# Patient Record
Sex: Female | Born: 1945 | Race: White | Hispanic: No | Smoking: Never smoker
Health system: Southern US, Community
[De-identification: ages and names within clinical notes are randomized; demographics above are authoritative.]

## PROBLEM LIST (undated history)

## (undated) DIAGNOSIS — I2699 Other pulmonary embolism without acute cor pulmonale: Secondary | ICD-10-CM

## (undated) DIAGNOSIS — K219 Gastro-esophageal reflux disease without esophagitis: Secondary | ICD-10-CM

## (undated) DIAGNOSIS — Z8669 Personal history of other diseases of the nervous system and sense organs: Secondary | ICD-10-CM

## (undated) DIAGNOSIS — E785 Hyperlipidemia, unspecified: Secondary | ICD-10-CM

## (undated) DIAGNOSIS — G4730 Sleep apnea, unspecified: Secondary | ICD-10-CM

## (undated) DIAGNOSIS — Z8673 Personal history of transient ischemic attack (TIA), and cerebral infarction without residual deficits: Secondary | ICD-10-CM

## (undated) DIAGNOSIS — I10 Essential (primary) hypertension: Secondary | ICD-10-CM

## (undated) DIAGNOSIS — T7840XA Allergy, unspecified, initial encounter: Secondary | ICD-10-CM

## (undated) DIAGNOSIS — I639 Cerebral infarction, unspecified: Secondary | ICD-10-CM

## (undated) DIAGNOSIS — M1990 Unspecified osteoarthritis, unspecified site: Secondary | ICD-10-CM

## (undated) HISTORY — PX: OTHER SURGICAL HISTORY: SHX169

## (undated) HISTORY — PX: CATARACT EXTRACTION, BILATERAL: SHX1313

## (undated) HISTORY — DX: Personal history of other diseases of the nervous system and sense organs: Z86.69

## (undated) HISTORY — DX: Gastro-esophageal reflux disease without esophagitis: K21.9

## (undated) HISTORY — DX: Cerebral infarction, unspecified: I63.9

## (undated) HISTORY — DX: Allergy, unspecified, initial encounter: T78.40XA

## (undated) HISTORY — DX: Sleep apnea, unspecified: G47.30

## (undated) HISTORY — DX: Personal history of transient ischemic attack (TIA), and cerebral infarction without residual deficits: Z86.73

## (undated) HISTORY — DX: Unspecified osteoarthritis, unspecified site: M19.90

## (undated) HISTORY — DX: Hyperlipidemia, unspecified: E78.5

---

## 1998-07-24 ENCOUNTER — Other Ambulatory Visit: Admission: RE | Admit: 1998-07-24 | Discharge: 1998-07-24 | Payer: Self-pay | Admitting: Gynecology

## 1998-12-06 DIAGNOSIS — Z8673 Personal history of transient ischemic attack (TIA), and cerebral infarction without residual deficits: Secondary | ICD-10-CM

## 1998-12-06 HISTORY — DX: Personal history of transient ischemic attack (TIA), and cerebral infarction without residual deficits: Z86.73

## 1999-11-04 ENCOUNTER — Other Ambulatory Visit: Admission: RE | Admit: 1999-11-04 | Discharge: 1999-11-04 | Payer: Self-pay | Admitting: Gynecology

## 2000-11-04 ENCOUNTER — Other Ambulatory Visit: Admission: RE | Admit: 2000-11-04 | Discharge: 2000-11-04 | Payer: Self-pay | Admitting: Gynecology

## 2001-11-23 ENCOUNTER — Other Ambulatory Visit: Admission: RE | Admit: 2001-11-23 | Discharge: 2001-11-23 | Payer: Self-pay | Admitting: Gynecology

## 2003-12-25 ENCOUNTER — Other Ambulatory Visit: Admission: RE | Admit: 2003-12-25 | Discharge: 2003-12-25 | Payer: Self-pay | Admitting: Gynecology

## 2005-10-18 ENCOUNTER — Other Ambulatory Visit: Admission: RE | Admit: 2005-10-18 | Discharge: 2005-10-18 | Payer: Self-pay | Admitting: Gynecology

## 2008-10-16 ENCOUNTER — Encounter: Admission: RE | Admit: 2008-10-16 | Discharge: 2008-10-16 | Payer: Self-pay | Admitting: Gynecology

## 2010-10-20 ENCOUNTER — Encounter: Admission: RE | Admit: 2010-10-20 | Discharge: 2010-10-20 | Payer: Self-pay | Admitting: Gynecology

## 2012-03-29 ENCOUNTER — Encounter: Payer: Self-pay | Admitting: *Deleted

## 2012-03-29 DIAGNOSIS — Z8669 Personal history of other diseases of the nervous system and sense organs: Secondary | ICD-10-CM | POA: Insufficient documentation

## 2012-03-29 DIAGNOSIS — Z8673 Personal history of transient ischemic attack (TIA), and cerebral infarction without residual deficits: Secondary | ICD-10-CM | POA: Insufficient documentation

## 2012-08-08 ENCOUNTER — Other Ambulatory Visit: Payer: Self-pay | Admitting: Gynecology

## 2012-08-08 DIAGNOSIS — Z1231 Encounter for screening mammogram for malignant neoplasm of breast: Secondary | ICD-10-CM

## 2012-08-09 ENCOUNTER — Ambulatory Visit
Admission: RE | Admit: 2012-08-09 | Discharge: 2012-08-09 | Disposition: A | Discharge status: Home / Self Care | Payer: BC Managed Care – PPO | Source: Ambulatory Visit | Attending: Gynecology | Admitting: Gynecology

## 2012-08-09 DIAGNOSIS — Z1231 Encounter for screening mammogram for malignant neoplasm of breast: Secondary | ICD-10-CM

## 2013-07-17 ENCOUNTER — Other Ambulatory Visit: Payer: Self-pay | Admitting: Gynecology

## 2013-07-17 DIAGNOSIS — E2839 Other primary ovarian failure: Secondary | ICD-10-CM

## 2013-07-17 DIAGNOSIS — Z780 Asymptomatic menopausal state: Secondary | ICD-10-CM

## 2013-08-14 ENCOUNTER — Ambulatory Visit
Admission: RE | Admit: 2013-08-14 | Discharge: 2013-08-14 | Disposition: A | Discharge status: Home / Self Care | Payer: BC Managed Care – PPO | Source: Ambulatory Visit

## 2013-08-14 ENCOUNTER — Other Ambulatory Visit: Payer: Self-pay

## 2013-08-14 ENCOUNTER — Ambulatory Visit
Admission: RE | Admit: 2013-08-14 | Discharge: 2013-08-14 | Disposition: A | Discharge status: Home / Self Care | Payer: BC Managed Care – PPO | Source: Ambulatory Visit | Attending: Gynecology | Admitting: Gynecology

## 2013-08-14 DIAGNOSIS — Z1231 Encounter for screening mammogram for malignant neoplasm of breast: Secondary | ICD-10-CM

## 2013-08-14 DIAGNOSIS — E2839 Other primary ovarian failure: Secondary | ICD-10-CM

## 2013-08-14 DIAGNOSIS — Z780 Asymptomatic menopausal state: Secondary | ICD-10-CM

## 2014-07-19 IMAGING — MG MM SCREEN MAMMOGRAM BILATERAL
4 series · 4 of 4 positions shown · non-contrast
Comparison: None.

CLINICAL DATA: Screening.

DIGITAL SCREENING BILATERAL MAMMOGRAM WITH CAD

[R CC]
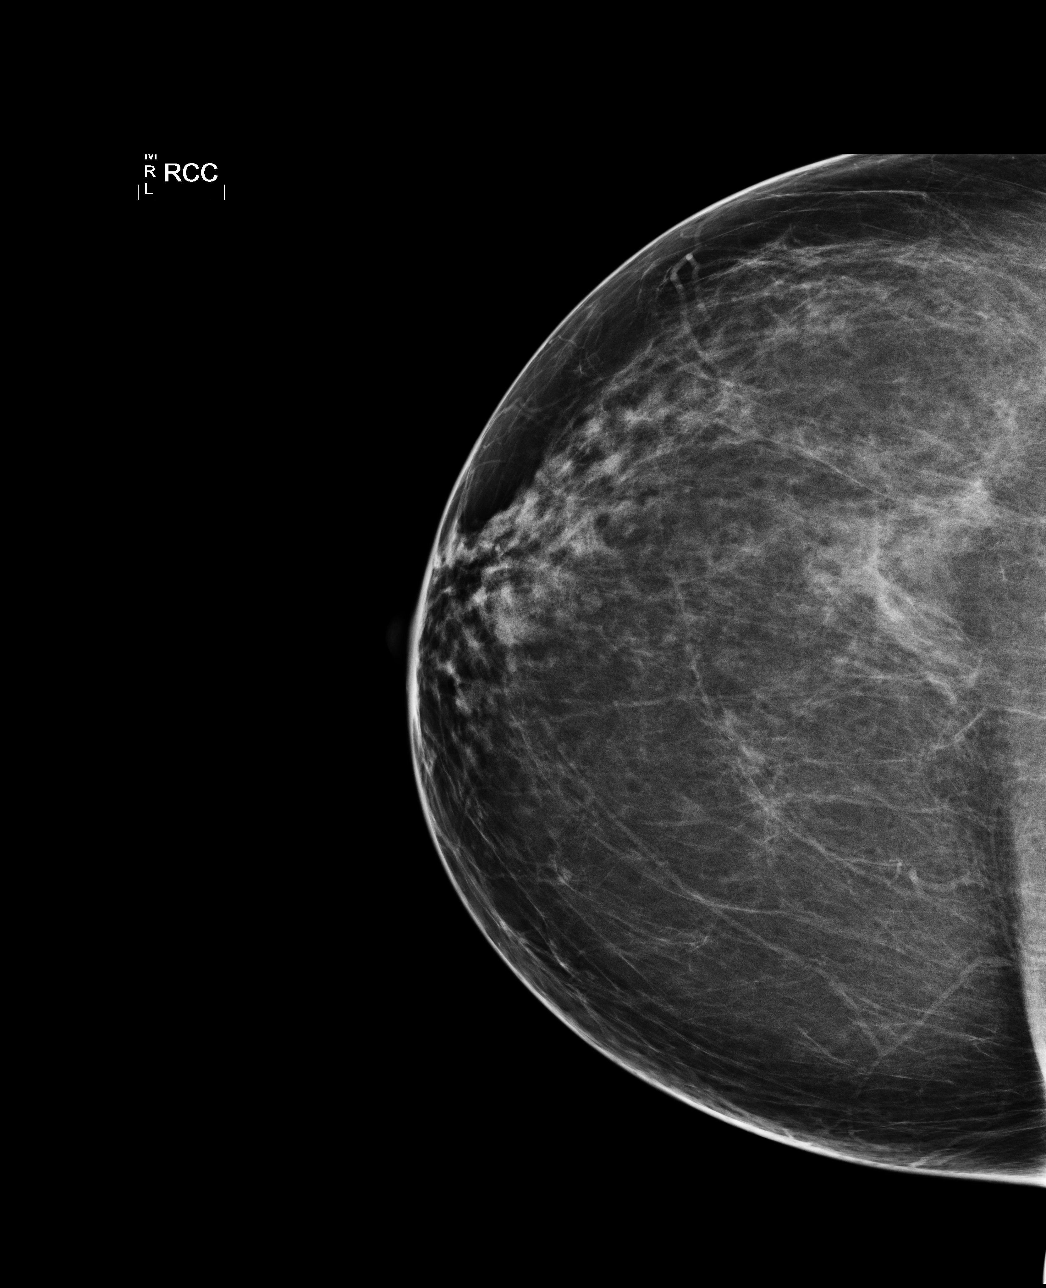

[L CC]
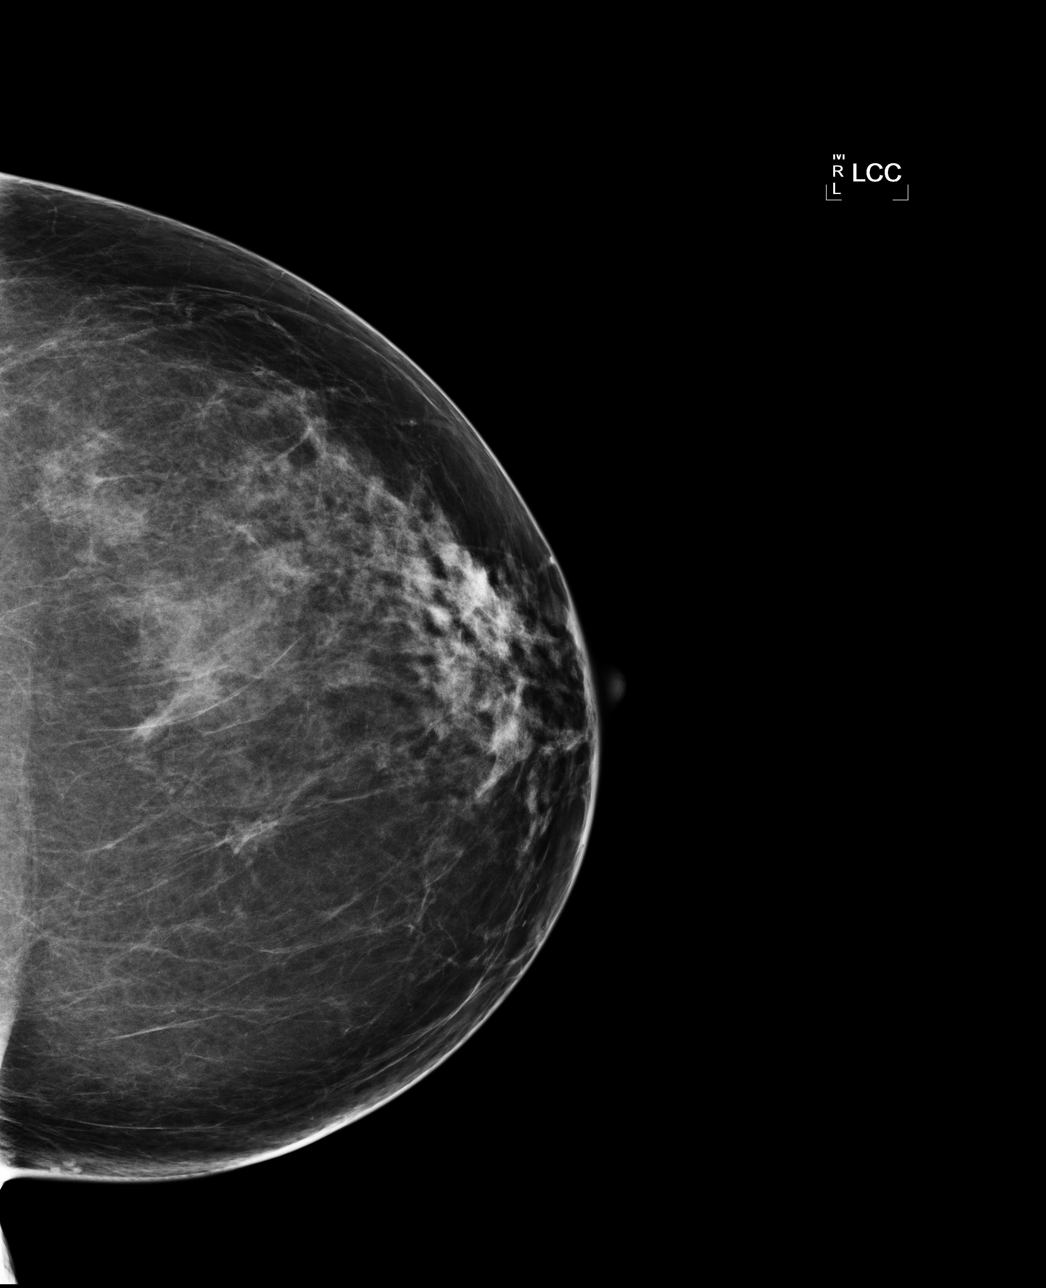

[L MLO]
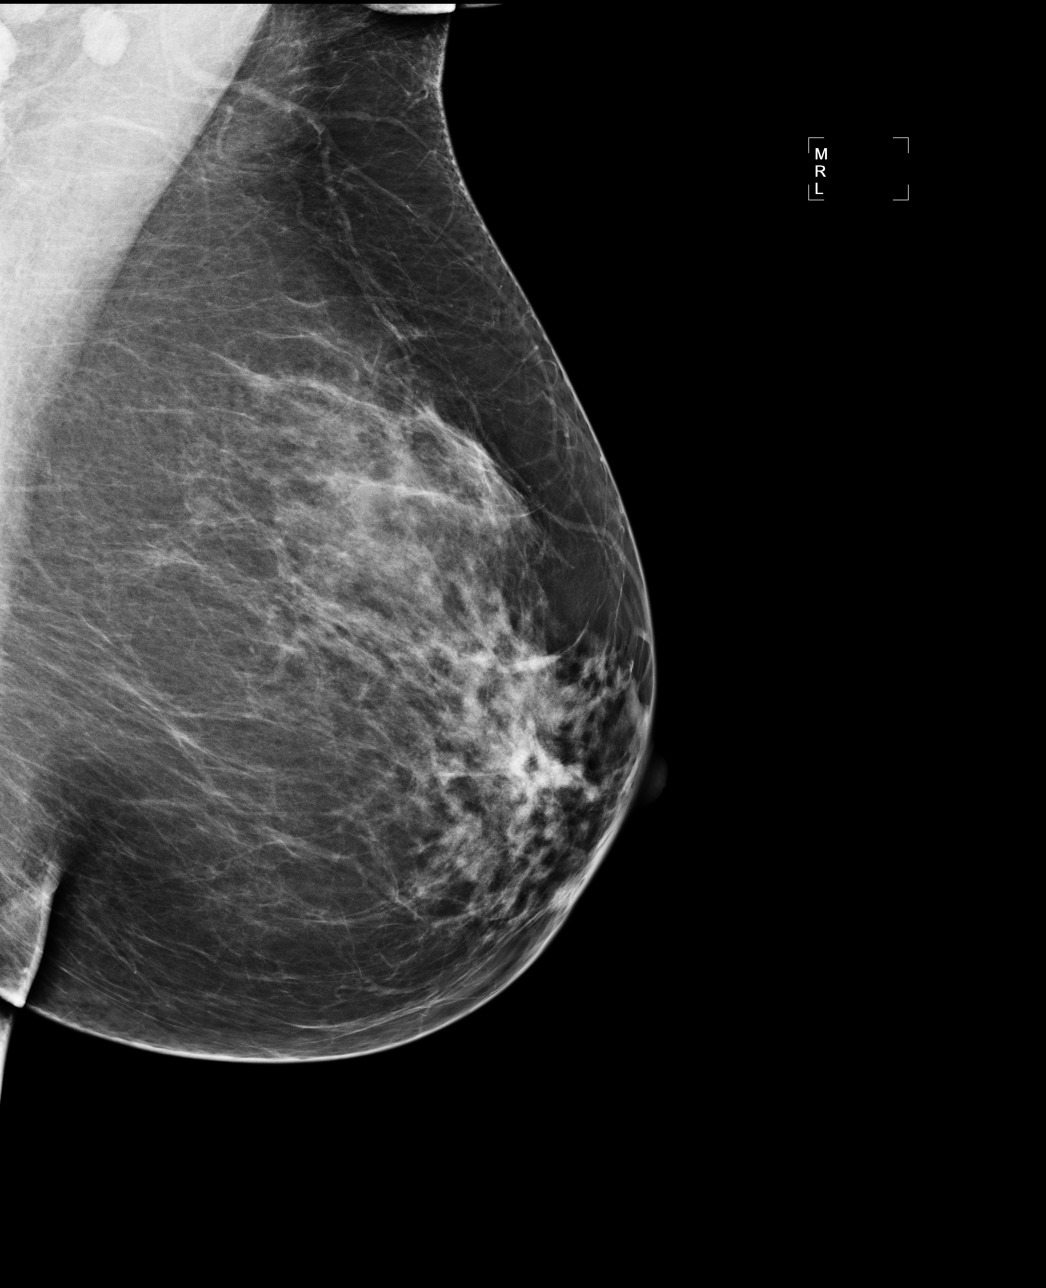

[R MLO]
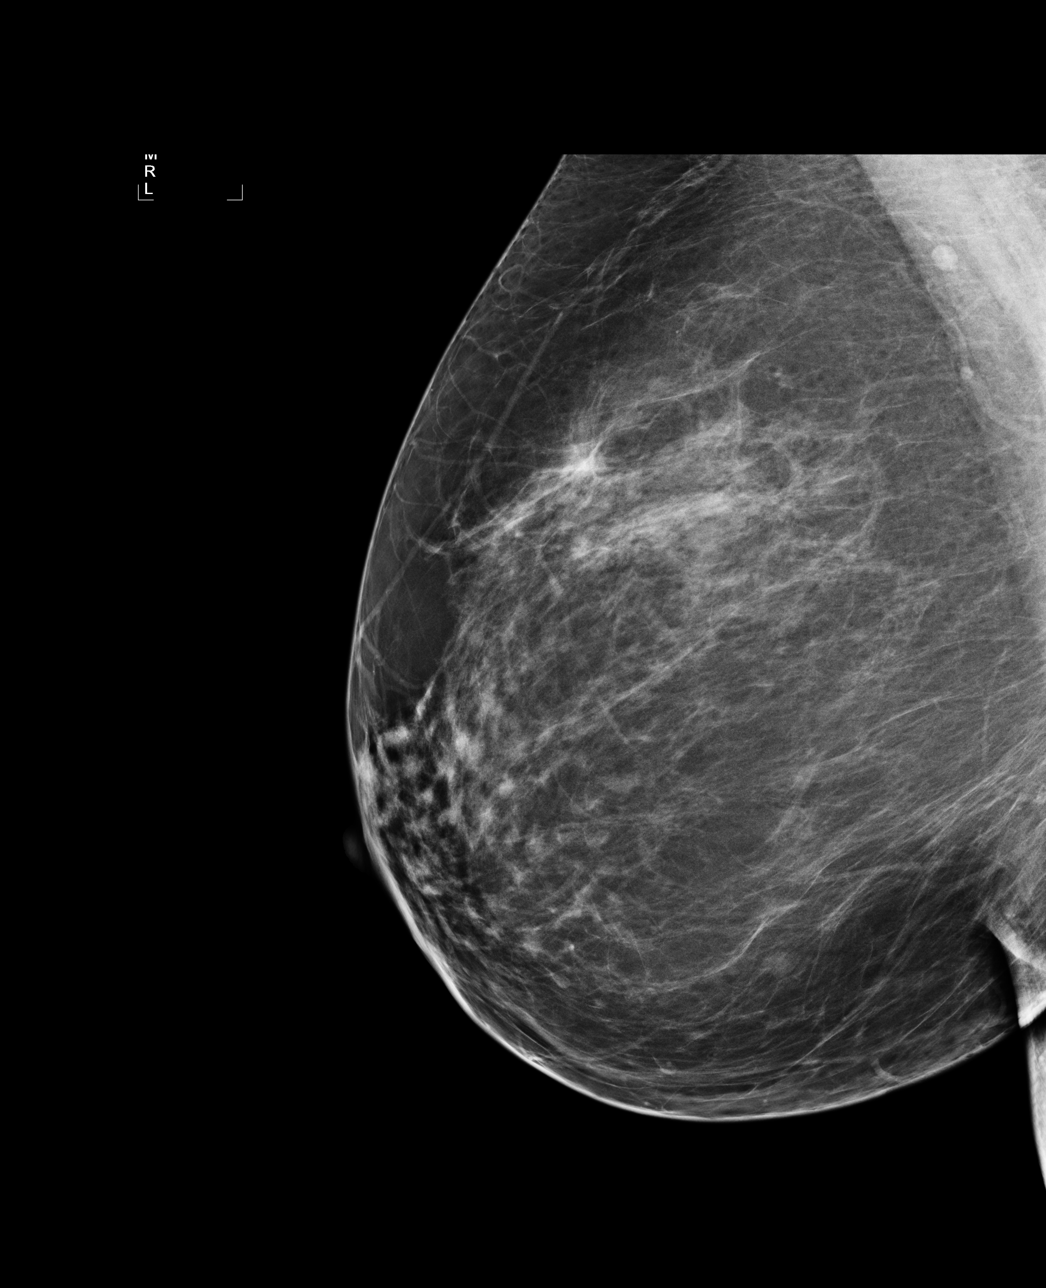

[4 of 4 positions shown; findings below may reference images not displayed]

FINDINGS: ACR Breast Density Category b:  There are scattered areas of
fibroglandular density.

There are no findings suspicious for malignancy.

Images were processed with CAD.
IMPRESSION: No mammographic evidence of malignancy.

A result letter of this screening mammogram will be mailed directly
to the patient.

RECOMMENDATION:
Screening mammogram in one year. (Code:WE-K-JAQ)

BI-RADS CATEGORY 1:  Negative.

## 2014-11-22 ENCOUNTER — Ambulatory Visit (INDEPENDENT_AMBULATORY_CARE_PROVIDER_SITE_OTHER): Payer: Medicare Other

## 2014-11-22 ENCOUNTER — Ambulatory Visit (INDEPENDENT_AMBULATORY_CARE_PROVIDER_SITE_OTHER): Payer: Medicare Other | Admitting: Podiatrist

## 2014-11-22 ENCOUNTER — Encounter: Payer: Self-pay | Admitting: Podiatrist

## 2014-11-22 VITALS — BP 148/92 | HR 79 | Resp 16

## 2014-11-22 DIAGNOSIS — M2012 Hallux valgus (acquired), left foot: Secondary | ICD-10-CM

## 2014-11-22 NOTE — Progress Notes (Signed)
   Subjective:    Patient ID: Leah Knight, female    DOB: [birth year], 68 y.o.   MRN: [id number]  HPI Comments: "I have a bunion"  Patient c/o aching 1st MPJ left for several months. Swelling by end of day. Shoes are getting uncomfortable. No home treatment.     Review of Systems  All other systems reviewed and are negative.      Objective:   Physical Exam Patient is awake, alert, and oriented x 3.  In no acute distress.  Vascular status is intact with palpable pedal pulses at 2/4 DP and PT bilateral and capillary refill time within normal limits. Neurological sensation is also intact bilaterally via Semmes Weinstein monofilament at 5/5 sites. Light touch, vibratory sensation, Achilles tendon reflex is intact. Dermatological exam reveals skin color, turger and texture as normal. No open lesions present.  Musculature intact with dorsiflexion, plantarflexion, inversion, eversion.    Left foot has a moderate bunion deformity present with increased IM angle and promentent metatarsal head medially which is painful in shoes and when walking    Assessment & Plan:  Hallux abducto valgus left foot.  Discussed conservative versus surgical options. Recommended a bunion correction with pin or screw fixation to the left foot. The consent form was discussed and all three pages were signed and the patient's questions were encouraged and answered to the best of my ability. Risks of the surgery were discussed including but not limited to continued pain, infection, swelling, elevated toe, decreased range of motion,  suture or implant reaction, bleeding, decreased function, etc. Preoperative instructions were also dispensed to the patient as well as a preoperative surgical pamphlet to go along with the instructions. Surgery will be scheduled at the patients convenience and patient will be seen at [facility] on outpatient basis.The patient is instructed to call if any questions or  concerns arise.

## 2014-11-22 NOTE — Patient Instructions (Signed)
Bunionectomy A bunionectomy is surgery to remove a bunion. A bunion is an enlargement of the joint at the base of the big toe. It is made up of bone and soft tissue on the inside part of the joint. Over time, a painful lump appears on the inside of the joint. The big toe begins to point inward toward the second toe. New bone growth can occur and a bone spur may form. The pain eventually causes difficulty walking. A bunion usually results from inflammation caused by the irritation of poorly fitting shoes. It often begins later in life. A bunionectomy is performed when nonsurgical treatment no longer works. When surgery is needed, the extent of the procedure will depend on the degree of deformity of the foot. Your surgeon will discuss with you the different procedures and what will work best for you depending on your age and health. LET YOUR CAREGIVER KNOW ABOUT:   Previous problems with anesthetics or medicines used to numb the skin.  Allergies to dyes, iodine, foods, and/or latex.  Medicines taken including herbs, eye drops, prescription medicines (especially medicines used to "thin the blood"), aspirin and other over-the-counter medicines, and steroids (by mouth or as a cream).  History of bleeding or blood problems.  Possibility of pregnancy, if this applies.  History of blood clots in your legs and/or lungs .  Previous surgery.  Other important health problems. RISKS AND COMPLICATIONS   Infection.  Pain.  Nerve damage.  Possibility that the bunion will recur. BEFORE THE PROCEDURE  You should be present 60 minutes prior to your procedure or as directed.  PROCEDURE  Surgery is often done so that you can go home the same day (outpatient). It may be done in a hospital or in an outpatient surgical center. An anesthetic will be used to help you sleep during the procedure. Sometimes, a spinal anesthetic is used to make you numb below the waist. A cut (incision) is made over the swollen  area at the first joint of the big toe. The enlarged lump will be removed. If there is a need to reposition the bones of the big toe, this may require more than 1 incision. The bone itself may need to be cut. Screws and wires may be used in the repair. These can be removed at a later date. In severe cases, the entire joint may need to be removed and a joint replacement inserted. When done, the incision is closed with stitches (sutures). Skin adhesive strips may be added for reinforcement. They help hold the incision closed.  AFTER THE PROCEDURE  Compression bandages (dressings) are then wrapped around the wound. This helps to keep the foot in alignment and reduce swelling. Your foot will be monitored for bleeding and swelling. You will need to stay for a few hours in the recovery area before being discharged. This allows time for the anesthesia to wear off. You will be discharged home when you are awake, stable, and doing well. HOME CARE INSTRUCTIONS   You can expect to return to normal activities within 6 to 8 weeks after surgery. The foot is at increased risk for swelling for several months. When you can expect to bear weight on the operated foot will depend on the extent of your surgery. The milder the deformity, the less tissue is removed and the sooner the return to normal activity level. During the recovery period, a special shoe, boot, or cast may be worn to accommodate the surgical bandage and to help provide stability   to the foot.  Once you are home, an ice pack applied to the operative site may help with discomfort and keep swelling down. Stop using the ice if it causes discomfort.  Keep your feet raised (elevated) when possible to lessen swelling.  If you have an elastic bandage on your foot and you have numbness, tingling, or your foot becomes cold and blue, adjust the bandage to make it comfortable.  Change dressings as directed.  Keep the wound dry and clean. The wound may be washed  gently with soap and water. Gently blot dry without rubbing. Do not take baths or use swimming pools or hot tubs for 10 days, or as instructed by your caregiver.  Only take over-the-counter or prescription medicines for pain, discomfort, or fever as directed by your caregiver.  You may continue a normal diet as directed.  For activity, use crutches with no weight bearing or your orthopedic shoe as directed. Continue to use crutches or a cane as directed until you can stand without causing pain. SEEK MEDICAL CARE IF:   You have redness, swelling, bruising, or increasing pain in the wound.  There is pus coming from the wound.  You have drainage from a wound lasting longer than 1 day.  You have an oral temperature above 102 F (38.9 C).  You notice a bad smell coming from the wound or dressing.  The wound breaks open after sutures have been removed.  You develop dizzy episodes or fainting while standing.  You have persistent nausea or vomiting.  Your toes become cold.  Pain is not relieved with medicines. SEEK IMMEDIATE MEDICAL CARE IF:   You develop a rash.  You have difficulty breathing.  You develop any reaction or side effects to medicines given.  Your toes are numb or blue, or you have severe pain. MAKE SURE YOU:   Understand these instructions.  Will watch your condition.  Will get help right away if you are not doing well or get worse. Document Released: 11/05/2005 Document Revised: 02/14/2012 Document Reviewed: 12/11/2007 ExitCare Patient Information 2015 [employer]. This information is not intended to replace advice given to you by your health care provider. Make sure you discuss any questions you have with your health care provider.  

## 2014-12-13 ENCOUNTER — Ambulatory Visit: Payer: Self-pay | Admitting: Podiatrist

## 2014-12-15 ENCOUNTER — Ambulatory Visit (INDEPENDENT_AMBULATORY_CARE_PROVIDER_SITE_OTHER): Payer: Medicare Other | Admitting: Emergency Medicine

## 2014-12-15 VITALS — BP 139/91 | HR 86 | Temp 97.5°F | Resp 16 | Ht 62.5 in | Wt 157.0 lb

## 2014-12-15 DIAGNOSIS — R42 Dizziness and giddiness: Secondary | ICD-10-CM

## 2014-12-15 LAB — POCT CBC
GRANULOCYTE PERCENT: 68.2 % (ref 37–80)
HEMATOCRIT: 42.8 % (ref 37.7–47.9)
Hemoglobin: 14.1 g/dL (ref 12.2–16.2)
Lymph, poc: 1.5 (ref 0.6–3.4)
MCH, POC: 30.6 pg (ref 27–31.2)
MCHC: 32.9 g/dL (ref 31.8–35.4)
MCV: 93 fL (ref 80–97)
MID (cbc): 0.4 (ref 0–0.9)
MPV: 7.3 fL (ref 0–99.8)
PLATELET COUNT, POC: 269 10*3/uL (ref 142–424)
POC Granulocyte: 4 (ref 2–6.9)
POC LYMPH PERCENT: 24.6 %L (ref 10–50)
POC MID %: 7.2 % (ref 0–12)
RBC: 4.6 M/uL (ref 4.04–5.48)
RDW, POC: 13.9 %
WBC: 5.9 10*3/uL (ref 4.6–10.2)

## 2014-12-15 LAB — COMPREHENSIVE METABOLIC PANEL
ALK PHOS: 52 U/L (ref 39–117)
ALT: 11 U/L (ref 0–35)
AST: 13 U/L (ref 0–37)
Albumin: 4 g/dL (ref 3.5–5.2)
BUN: 16 mg/dL (ref 6–23)
CO2: 24 meq/L (ref 19–32)
Calcium: 9.3 mg/dL (ref 8.4–10.5)
Chloride: 106 mEq/L (ref 96–112)
Creat: 0.92 mg/dL (ref 0.50–1.10)
GLUCOSE: 87 mg/dL (ref 70–99)
POTASSIUM: 4.4 meq/L (ref 3.5–5.3)
Sodium: 140 mEq/L (ref 135–145)
Total Bilirubin: 0.6 mg/dL (ref 0.2–1.2)
Total Protein: 6.7 g/dL (ref 6.0–8.3)

## 2014-12-15 LAB — TSH: TSH: 2.209 u[IU]/mL (ref 0.350–4.500)

## 2014-12-15 MED ORDER — MECLIZINE HCL 25 MG PO TABS: 25.0000 mg | ORAL_TABLET | Freq: Three times a day (TID) | ORAL | Status: DC | PRN

## 2014-12-15 NOTE — Patient Instructions (Signed)

## 2014-12-15 NOTE — Progress Notes (Signed)
[facility] [address], [place] [address] [phone]  Date:  12/15/2014   Name:  Leah Knight   DOB:  [birth year]   MRN:  [id number]  PCP:  Wonda Cerise, MD    Chief Complaint: Hypertension and Headache   History of Present Illness:  Leah Knight is a 69 y.o. very pleasant female patient who presents with the following:  Says has experienced frequent episodes of dizziness.  No falls.  Sometimes has to reach for support No history of antecedent injury or illness. No headache.  Some blurred vision and dizziness when she lays down.  Denies diplopia or nausea or vomiting No fever or chills Is alarmed as her BP on her home machine has been elevated.  No evidence end organ injury No history of prior hypertension requiring treatment.  Patient Active Problem List   Diagnosis Date Noted  . Hx of retinal hemorrhage   . H/O: stroke     Past Medical History  Diagnosis Date  . Hx of retinal hemorrhage   . 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 Childbirth      x 2    History  Substance Use Topics  . Smoking status: Never Smoker   . Smokeless tobacco: Not on file  . Alcohol Use: Not on file    Family History  Problem Relation Age of Onset  . Heart failure Mother   . Hypertension Mother   . Hypertension Brother   . Diabetes Brother     Allergies  Allergen Reactions  . Sulfa Antibiotics     Medication list has been reviewed and updated.  Current Outpatient Prescriptions on File Prior to Visit  Medication Sig Dispense Refill  . aspirin 81 MG tablet Take 81 mg by mouth daily.    Marland Kitchen b complex vitamins tablet Take 1 tablet by mouth daily.    Marland Kitchen FOLIC ACID PO Take by mouth daily.    . Multiple Vitamin (MULTIVITAMIN) tablet Take 1 tablet by mouth daily.     No current facility-administered medications on file prior to visit.    Review of Systems:  As per HPI, otherwise  negative.    Physical Examination: Filed Vitals:   12/15/14 1001  BP: 139/91  Pulse: 86  Temp: 97.5 F (36.4 C)  Resp: 16   Filed Vitals:   12/15/14 1001  Height: 5' 2.5" (1.588 m)  Weight: 157 lb (71.215 kg)   Body mass index is 28.24 kg/(m^2). Ideal Body Weight: Weight in (lb) to have BMI = 25: 138.6  GEN: WDWN, NAD, Non-toxic, A & O x 3 HEENT: Atraumatic, Normocephalic. Neck supple. No masses, No LAD. Ears and Nose: No external deformity. CV: RRR, No M/G/R. No JVD. No thrill. No extra heart sounds. PULM: CTA B, no wheezes, crackles, rhonchi. No retractions. No resp. distress. No accessory muscle use. ABD: S, NT, ND, +BS. No rebound. No HSM. EXTR: No c/c/e NEURO Normal gait. CN 2-12 intact  PRRERLA EOMI.  Romberg and tandem gait intact. PSYCH: Normally interactive. Conversant. Not depressed or anxious appearing.  Calm demeanor.    Assessment and Plan: Dizziness Borderline HBP Labs Back to Dr Jeralene Huff this week and take BP cuff  Signed,  Ellison Carwin, MD

## 2018-01-25 ENCOUNTER — Other Ambulatory Visit (HOSPITAL_COMMUNITY): Payer: Self-pay | Admitting: Internal Medicine

## 2018-01-25 DIAGNOSIS — I679 Cerebrovascular disease, unspecified: Secondary | ICD-10-CM

## 2018-01-27 ENCOUNTER — Ambulatory Visit (HOSPITAL_COMMUNITY)
Admission: RE | Admit: 2018-01-27 | Discharge: 2018-01-27 | Disposition: A | Discharge status: Home / Self Care | Payer: Medicare Other | Source: Ambulatory Visit | Attending: Surgery | Admitting: Surgery

## 2018-01-27 DIAGNOSIS — I779 Disorder of arteries and arterioles, unspecified: Secondary | ICD-10-CM | POA: Insufficient documentation

## 2018-01-27 DIAGNOSIS — I679 Cerebrovascular disease, unspecified: Secondary | ICD-10-CM | POA: Insufficient documentation

## 2018-01-30 LAB — VAS US CAROTID
LCCAPSYS: 98 cm/s
LEFT ECA DIAS: -19 cm/s
LEFT VERTEBRAL DIAS: 21 cm/s
LICADDIAS: -20 cm/s
LICAPSYS: -63 cm/s
Left CCA dist dias: -27 cm/s
Left CCA dist sys: -79 cm/s
Left CCA prox dias: 24 cm/s
Left ICA dist sys: -100 cm/s
Left ICA prox dias: -17 cm/s
RCCAPDIAS: 16 cm/s
RIGHT CCA MID DIAS: -23 cm/s
RIGHT VERTEBRAL DIAS: 20 cm/s
Right CCA prox sys: 99 cm/s
Right cca dist sys: -86 cm/s

## 2018-04-26 ENCOUNTER — Encounter: Payer: Self-pay | Admitting: Neurology

## 2018-04-27 ENCOUNTER — Ambulatory Visit: Payer: Medicare Other | Admitting: Neurology

## 2018-04-27 ENCOUNTER — Encounter: Payer: Self-pay | Admitting: Neurology

## 2018-04-27 VITALS — BP 133/83 | HR 66 | Ht 62.0 in | Wt 166.0 lb

## 2018-04-27 DIAGNOSIS — G4710 Hypersomnia, unspecified: Secondary | ICD-10-CM

## 2018-04-27 DIAGNOSIS — R0683 Snoring: Secondary | ICD-10-CM | POA: Diagnosis not present

## 2018-04-27 DIAGNOSIS — G4730 Sleep apnea, unspecified: Secondary | ICD-10-CM

## 2018-04-27 DIAGNOSIS — M2669 Other specified disorders of temporomandibular joint: Secondary | ICD-10-CM | POA: Diagnosis not present

## 2018-04-27 DIAGNOSIS — M62838 Other muscle spasm: Secondary | ICD-10-CM

## 2018-04-27 DIAGNOSIS — Z72821 Inadequate sleep hygiene: Secondary | ICD-10-CM

## 2018-04-27 NOTE — Progress Notes (Addendum)
SLEEP MEDICINE CLINIC   Provider:  Larey Seat, [place] D  Primary Care Physician:  Haywood Pao, MD   Referring Provider:  Dr. Domenick Gong, MD  Chief Complaint  Patient presents with  . New Patient (Initial Visit)    pt alone, rm 11. pt states that she snores and stops breathing at night, she wakes up with headaches in the morning. never had a sleep study. pt describes a cramping feeling that can be bilaterally and it shoots a pain into her head, happens daily but only intermintently.      HPI:  Leah Knight is a 72 y.o. female , seen here in a referral from Dr. Osborne Casco for a sleep evaluation.   Chief complaint according to patient : Leah Knight is a 72 year old Caucasian right-handed female who presents today for excessive daytime sleepiness stating that she sometimes gets very tired when she drives home.  Her daughter has also witnessed her sleep pattern and has stated that she snores and breathes irregularly.  There is a high suspicion that she may have obstructive sleep apnea.  Sleep habits are as follows: watches Tv in the living room in a recliner where she tends to fall asleep around 9 PM. She goes to bed around 11, her bedroom is cool, quiet and dark.  She usually prefers to sleep on her sides but she often wakes up on her back.  She sleeps in a flat bed, on one single pillow for head. Some nights she will prop up with more pillows but this is the exception. The patient sleeps alone, lives alone and has no pets. She will watch Tv in the bedroom when she has trouble to go to sleep. Usually she is asleep promptly and wakes for one nocturia between 2-3 AM, she rises at 4.30 and an  Alarm is needed. She is at work at 7 AM- she works in an [employer] office.    Sleep medical history and family sleep history:  Daughter has OSA, insomnia. No sleep walking history, no shift work history.  Social history:  Needs to continue working part time at [employer], [employer]( 2000) .  Widowed 2004, had not enough income- went back to work. 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 Non smoker, non ETOH, caffeine use, 1-2 mugs in AM- no soda but 1-2 glasses of  iced tea- at lunch and in PM.    Review of Systems: Out of a complete 14 system review, the patient complains of only the following symptoms, and all other reviewed systems are negative. GERD, overactive bladder, bronchitis recurrent. Urge incontinence.   Reports snoring, apnea, nocturia, sharp pain and spasms in her lateral neck muscles after yawning- locking in.    Epworth score 11  , Fatigue severity score 15  , depression score 2/ 15   Social History   Socioeconomic History  . Marital status: Widowed    Spouse name: Not on file  . Number of children: Not on file  . Years of education: Not on file  . Highest education level: Not on file  Occupational History  . Not on file  Social Needs  . Financial resource strain: Not on file  . Food insecurity:    Worry: Not on file    Inability: Not on file  . Transportation needs:    Medical: Not on file    Non-medical: Not on file  Tobacco Use  . Smoking status: Never Smoker  . Smokeless tobacco: Never Used  Substance and Sexual  Activity  . Alcohol use: Not Currently  . Drug use: Not Currently  . Sexual activity: Not on file  Lifestyle  . Physical activity:    Days per week: Not on file    Minutes per session: Not on file  . Stress: Not on file  Relationships  . Social connections:    Talks on phone: Not on file    Gets together: Not on file    Attends religious service: Not on file    Active member of club or organization: Not on file    Attends meetings of clubs or organizations: Not on file    Relationship status: Not on file  . Intimate partner violence:    Fear of current or ex partner: Not on file    Emotionally abused: Not on file    Physically abused: Not on file    Forced sexual activity: Not on file  Other Topics Concern  . Not on file  Social  History Narrative  . Not on file    Family History  Problem Relation Age of Onset  . Heart failure Mother   . Hypertension Mother   . Hypertension Brother   . Diabetes Brother   . Pneumonia Father   . Diabetes Father     Past Medical History:  Diagnosis Date  . GERD (gastroesophageal reflux disease)   . 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 Hx of retinal hemorrhage   . Hyperlipemia   . Stroke [facility])     Past Surgical History:  Procedure Laterality Date  . childbirth     x 2    Current Outpatient Medications  Medication Sig Dispense Refill  . amoxicillin (AMOXIL) 500 MG capsule Take 500 mg by mouth 3 (three) times daily.    Marland Kitchen atorvastatin (LIPITOR) 20 MG tablet Take 20 mg by mouth daily.    Marland Kitchen omeprazole (PRILOSEC) 20 MG capsule Take by mouth.     No current facility-administered medications for this visit.     Allergies as of 04/27/2018 - Review Complete 04/27/2018  Allergen Reaction Noted  . Sulfa antibiotics  03/29/2012    Vitals: BP 133/83   Pulse 66   Ht 5\' 2"  (1.575 m)   Wt 166 lb (75.3 kg)   BMI 30.36 kg/m  Last Weight:  Wt Readings from Last 1 Encounters:  04/27/18 166 lb (75.3 kg)   DUK:GURK mass index is 30.36 kg/m.     Last Height:   Ht Readings from Last 1 Encounters:  04/27/18 5\' 2"  (1.575 m)    Physical exam:  General: The patient is awake, alert and appears not in acute distress. The patient is well groomed. Head: Normocephalic, atraumatic. Neck is supple. Mallampati 3- u irritated soft palate- red and puffy.   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  .  Cardiovascular:  Regular rate and rhythm , without  murmurs or carotid bruit, and without distended neck veins. Respiratory: Lungs are clear to auscultation. Skin:  Without evidence of edema, or rash Trunk: BMI is 27  Neurologic exam : The patient is awake and alert, oriented to place and time.   Speech is fluent,  without dysarthria,  dysphonia or aphasia.  No temporal artery induration noted.  Mood and affect are appropriate.  Cranial nerves: Pupils are equal and briskly reactive to light. Funduscopic exam without evidence of pallor or edema. Left eye reportedly had a stroke in 2000- retina , Small cataract .  Extraocular movements in vertical and horizontal planes intact and without nystagmus. Visual fields by finger perimetry :The bottom quadrant left has a scotoma- visual defect.   Hearing to finger rub intact.  Facial sensation intact to fine touch. Facial motor strength is symmetric and tongue and uvula move midline. Shoulder shrug was symmetrical.   Motor exam:  Normal tone, muscle bulk and symmetric strength in all extremities. Sensory:  Fine touch, pinprick and vibration were tested in all extremities. Proprioception tested in the upper extremities was normal. Coordination: Rapid alternating movements in the fingers/hands was normal. Finger-to-nose maneuver  normal without evidence of ataxia, dysmetria or tremor. Gait and station: Patient walks without assistive device. Strength within normal limits. Stance is stable and normal. Turns with 3 Steps. Romberg testing is negative. Deep tendon reflexes: in the  upper and lower extremities are symmetric and intact. Babinski maneuver response is downgoing.   Assessment:  After physical and neurologic examination, review of laboratory studies,  Personal review of imaging studies, reports of other /same  Imaging studies, results of polysomnography and / or neurophysiology testing and pre-existing records as far as provided in visit., my assessment is   1) Mrs. Economos describes feeling fatigued and tired but usually in the after lunch hours, overall she endorsed not much fatigue but a slightly elevated level of daytime sleepiness at 11 points.  She knows she is snoring she has been told that family members have witnessed apnea.  She also does not get enough sleep.  She falls  asleep in front of the TV sitting in a recliner, then transfers to the bedroom for another for 5 hours of sleep.  She also has quite significant retrognathia.  I do think she has therefore significant risk factors for obstructive sleep apnea.  2) not addressed in today's visit with her report of neck spasms that occur after she yawns but seems not to be related to previous chewing or  talking.  I do not think this is a claudication in the traditional sense, there was no temporal ertery induration, but a history of " eye stroke"  but I will send a sed rate and C-reactive protein to rule out temporal arteritis.  3) sleep hygiene discussed, eliminate electronics from your bedroom, go to bed when you are sleepy, avoid daytime naps. Reduce iced tea after lunch for bladder irritation and nocturia.   The patient was advised of the nature of the diagnosed disorder , the treatment options and the  risks for general health and wellness arising from not treating the condition.   I spent more than 45  minutes of face to face time with the patient.  Greater than 50% of time was spent in counseling and coordination of care. We have discussed the diagnosis and differential and I answered the patient's questions.    Plan:  Treatment plan and additional workup :  I like for the patient to undergo a home sleep test which should allow [place] to screen for sleep apnea.  I will give her a booklet about improving her sleep.  Larey Seat, MD 0/09/9322, 5:57 AM  Certified in Neurology by ABPN Certified in [place] by [facility] [address] [place], [place] [address]

## 2018-04-28 ENCOUNTER — Telehealth: Payer: Self-pay | Admitting: *Deleted

## 2018-04-28 LAB — SEDIMENTATION RATE: Sed Rate: 4 mm/hr (ref 0–40)

## 2018-04-28 LAB — C-REACTIVE PROTEIN: CRP: 7 mg/L — ABNORMAL HIGH (ref 0.0–4.9)

## 2018-04-28 NOTE — Telephone Encounter (Addendum)
Called pt & LVM asking for call back just when she can. Informed pt that office is closed now and will reopen on Tuesday. Left office number in message.   ----- Message from Larey Seat, MD sent at 04/28/2018 10:28 AM EDT ----- Insignificant CRP when adjusted for age, sed rate low. We won't have to worry about temporal arteritis.

## 2018-05-03 NOTE — Telephone Encounter (Signed)
Pt returned RN's call °

## 2018-05-03 NOTE — Telephone Encounter (Signed)
I really don't have any other suspicions/ possible origin or cause.  Other patients with this symptom were on Prozac or related antidepressants ( SSRI ) when they reported this phenomenon, but Leah Knight is not.  I am glad her inflammatory markers tested negative.

## 2018-05-03 NOTE — Telephone Encounter (Signed)
Called pt back and informed her that Dr. Brett Fairy stated that she does not have any other suspicions or thoughts on what could be causing her symptoms. Also informed pt that other pts who have had this symptom were on Prozac or related antidepressants but she is not. RN offered to pass along any further questions if she had them. Pt stated well I guess this isn't serious. She had no further questions.  Pt verbalized appreciation.

## 2018-05-03 NOTE — Telephone Encounter (Signed)
Tried to call pt back. LVM asking for call back.

## 2018-05-03 NOTE — Telephone Encounter (Signed)
Tried pt's cell phone. Spoke with her and let her know that based on her labs that were done after her last office visit, we won't have to worry about temporal arteritis. Pt verbalized understanding and would like to know if Dr. Brett Fairy has any other thoughts on what this could be re: neck spasms after yawning. RN advised she would ask Dr. Brett Fairy and then return pt's call. Pt appreciative.

## 2018-05-31 ENCOUNTER — Ambulatory Visit: Payer: Medicare Other | Admitting: Neurology

## 2018-05-31 DIAGNOSIS — G4710 Hypersomnia, unspecified: Secondary | ICD-10-CM | POA: Diagnosis not present

## 2018-05-31 DIAGNOSIS — R0683 Snoring: Secondary | ICD-10-CM

## 2018-05-31 DIAGNOSIS — M62838 Other muscle spasm: Secondary | ICD-10-CM

## 2018-05-31 DIAGNOSIS — G4730 Sleep apnea, unspecified: Principal | ICD-10-CM

## 2018-05-31 DIAGNOSIS — Z72821 Inadequate sleep hygiene: Secondary | ICD-10-CM

## 2018-06-19 NOTE — Addendum Note (Signed)
Addended by: Larey Seat on: 06/19/2018 05:52 PM   Modules accepted: Orders

## 2018-06-19 NOTE — Procedures (Signed)
[facility] [place], [place] [address] NAME:  Leah Knight                                                                DOB: 1946-09-12 MEDICAL RECORD NUMBER [id number]                                                     DOS: 05/31/2018   REFERRING PHYSICIAN: Domenick Gong, MD STUDY PERFORMED: Home Sleep Study on watch pat HISTORY: Leah Knight is a 72 year old Caucasian right-handed female who presents for excessive daytime sleepiness stating that she sometimes gets very tired when she drives home.  Her daughter has also witnessed her sleep pattern and has stated that she snores and breathes irregularly. There is a high suspicion that she may have obstructive sleep apnea. The patient sleeps alone, lives alone and has no pets. She will watch Tv in the bedroom when she has trouble to go to sleep. Usually she is asleep promptly and wakes for one nocturia between 2-3 AM, she rises at 4.30 by Alarm. She is at work at 7 AM- she works in a [employer] office.  Epworth Sleepiness score endorsed at 11/24 points, the Fatigue severity score endorsed at 15 /63 points.  BMI: 30.4  STUDY RESULTS:  Total Recording Time: 7 hours 53 minutes: valid test time 7h and 4 min.  Total Apnea/Hypopnea Index (AHI):  22.5 /h; RDI:  22.5 /h Average Oxygen Saturation:  93%; Lowest Oxygen Desaturation: 70 %  Total Time Oxygen Saturation Below 89 %:  6.3 minutes  Average Heart Rate:  72 bpm (between 59 and 94 bpm) IMPRESSION: Mild - moderate sleep apnea, obstructive type. Mild non-continuous snoring. No prolonged hypoxemia.   RECOMMENDATION: I recommend treatment with CPAP, and weight loss as main therapies. I will order a CPAP auto-titration with a pressure from 5-15 cm water with 3 cm EPR.    I certify that I have reviewed the raw data recording prior to the issuance of this report in accordance with the standards of the American Academy of Sleep Medicine (AASM). Larey Seat, M.D.    06-19-2018       Medical Director of [facility], accredited by the AASM. Diplomat of the ABPN and ABSM.       [facility] [place], [place] [address] NAME:  Leah Knight                                                                DOB: 07-14-1946 MEDICAL RECORD NUMBER [id number]  DOS: 05/31/2018   REFERRING PHYSICIAN: Domenick Gong, MD STUDY PERFORMED: Home Sleep Study on watch pat HISTORY: Leah Knight is a 72 year old Caucasian right-handed female who presents for excessive daytime sleepiness stating that she sometimes gets very tired when she drives home.  Her daughter has also witnessed her sleep pattern and has stated that she snores and breathes irregularly. There is a high suspicion that she may have obstructive sleep apnea. The patient sleeps alone, lives alone and has no pets. She will watch Tv in the bedroom when she has trouble to go to sleep. Usually she is asleep promptly and wakes for one nocturia between 2-3 AM, she rises at 4.30 by Alarm. She is at work at 7 AM- she works in a [employer] office.  Epworth Sleepiness score endorsed at 11/24 points, the Fatigue severity score endorsed at 15 /63 points.  BMI: 30.4  STUDY RESULTS:  Total Recording Time: 7 hours 53 minutes: valid test time 7h and 4 min.  Total Apnea/Hypopnea Index (AHI):  22.5 /h; RDI:  22.5 /h Average Oxygen Saturation:  93%; Lowest Oxygen Desaturation: 70 %  Total Time Oxygen Saturation Below 89 %:  6.3 minutes  Average Heart Rate:  72 bpm (between 59 and 94 bpm) IMPRESSION: Mild - moderate sleep apnea, obstructive type. Mild non-continuous snoring. No prolonged hypoxemia.   RECOMMENDATION: I recommend treatment with CPAP, and weight loss as main therapies. I will order a CPAP auto-titration with a pressure from 5-15 cm water with 3 cm EPR.    I certify that I have reviewed the  raw data recording prior to the issuance of this report in accordance with the standards of the American Academy of Sleep Medicine (AASM). Larey Seat, M.D.    06-19-2018       Medical Director of [facility] at [facility], accredited by the AASM. Diplomat of the ABPN and ABSM.

## 2018-06-20 ENCOUNTER — Telehealth: Payer: Self-pay | Admitting: Neurology

## 2018-06-20 NOTE — Telephone Encounter (Signed)
I called pt. I advised pt that Dr. Brett Fairy reviewed their sleep study results and found that pt has sleep apnea. Dr. Brett Fairy recommends that pt starts cpap. I reviewed PAP compliance expectations with the pt. Pt is agreeable to starting a CPAP. I advised pt that an order will be sent to a DME, [facility], and [facility] will call the pt within about one week after they file with the pt's insurance. [facility] will show the pt how to use the machine, fit for masks, and troubleshoot the CPAP if needed. A follow up appt was made for insurance purposes with Dr. Brett Fairy on Oct 10,2019 at 10:30 am. Pt verbalized understanding to arrive 15 minutes early and bring their CPAP. A letter with all of this information in it will be mailed to the pt as a reminder. I verified with the pt that the address we have on file is correct. Pt verbalized understanding of results. Pt had no questions at this time but was encouraged to call back if questions arise.

## 2018-06-20 NOTE — Telephone Encounter (Signed)
-----   Message from Larey Seat, MD sent at 06/19/2018  5:52 PM EDT ----- IMPRESSION: Mild - moderate sleep apnea, obstructive type. Mild  non-continuous snoring. No prolonged hypoxemia.  RECOMMENDATION: I recommend treatment with CPAP, and weight loss  as main therapies. I will order a CPAP auto-titration with a pressure from 5-15 cm  water with 3 cm EPR.

## 2018-07-24 NOTE — Telephone Encounter (Signed)
Received a notification from [facility], "Called to schedule and LVM- 06/23/2018, 06/25/2018  on 07/10/2018- Spoke with the patient went over financials and Auto pay. We also discussed possible cost of supplies later down the road. She advised she will need time to think this over and would like me to wait until next week to call her back if she has not called me back before then."  Will wait to hear if she decides to start.

## 2018-09-14 ENCOUNTER — Ambulatory Visit: Payer: Self-pay | Admitting: Neurology

## 2018-10-27 ENCOUNTER — Encounter: Payer: Self-pay | Admitting: Neurology

## 2018-12-14 ENCOUNTER — Encounter: Payer: Self-pay | Admitting: Neurology

## 2018-12-14 ENCOUNTER — Ambulatory Visit: Payer: Medicare Other | Admitting: Neurology

## 2018-12-14 VITALS — BP 143/80 | HR 69 | Ht 62.0 in | Wt 169.0 lb

## 2018-12-14 DIAGNOSIS — M2669 Other specified disorders of temporomandibular joint: Secondary | ICD-10-CM | POA: Diagnosis not present

## 2018-12-14 DIAGNOSIS — G4733 Obstructive sleep apnea (adult) (pediatric): Secondary | ICD-10-CM

## 2018-12-14 DIAGNOSIS — Z72821 Inadequate sleep hygiene: Secondary | ICD-10-CM | POA: Diagnosis not present

## 2018-12-14 DIAGNOSIS — G4730 Sleep apnea, unspecified: Secondary | ICD-10-CM

## 2018-12-14 DIAGNOSIS — G4710 Hypersomnia, unspecified: Secondary | ICD-10-CM

## 2018-12-14 DIAGNOSIS — Z9989 Dependence on other enabling machines and devices: Secondary | ICD-10-CM

## 2018-12-14 DIAGNOSIS — M62838 Other muscle spasm: Secondary | ICD-10-CM

## 2018-12-14 NOTE — Progress Notes (Addendum)
SLEEP MEDICINE CLINIC   Provider:  Larey Seat, MD    Primary Care Physician:  Haywood Pao, MD   Referring Provider:  Dr. Domenick Gong, MD  Chief Complaint  Patient presents with  . Follow-up    pt alone, rm 10. pt states the machine is working well. no issues or concerns. DME [facility].      Interval history from 14 December 2018.  I have the pleasure of seeing Montserrat Shek today in a follow-up from her home sleep study on watch [name] performed on May 31, 2018.  The home sleep test had revealed that she had moderate sleep apnea with an AHI of 22.5 her RDI was at the same level the oxygen nadir was 70% of total time in oxygenation below 89% for 6.3 minutes her average heart rate was 72 bpm and regular.  I recommended treatment with CPAP and she started on an autotitrator for the last 30 days she has reached a compliance of 75% by time and 87% by days.  This is sufficient for the Medicare criteria.  Her average user time on days used is 5 hours and 4 minutes.  The machine is set between 5 and 15 cmH2O with 3 cm expiratory pressure relief.  The residual apnea hypopnea index is 1.2/h.  Pressure at the 95th percentile is 13.7 cm, she has mild air leaks. She is using nasal pillows, does not want a full face mask, has retrognathia.  She feel more rested, not sleepy and attentive at her job. "CPAP works for me ".    HPI:  Leah Knight is a 73 y.o. female , seen here in a referral from Dr. Osborne Casco for a sleep evaluation.   Chief complaint according to patient : Mrs. Hodgkins is a 73 year old Caucasian right-handed female who presents today for excessive daytime sleepiness stating that she sometimes gets very tired when she drives home.  Her daughter has also witnessed her sleep pattern and has stated that she snores and breathes irregularly.  There is a high suspicion that she may have obstructive sleep apnea.  Sleep habits are as follows: watches Tv in the living room in a  recliner where she tends to fall asleep around 9 PM. She goes to bed around 11, her bedroom is cool, quiet and dark.  She usually prefers to sleep on her sides but she often wakes up on her back.  She sleeps in a flat bed, on one single pillow for head. Some nights she will prop up with more pillows but this is the exception. The patient sleeps alone, lives alone and has no pets. She will watch Tv in the bedroom when she has trouble to go to sleep. Usually she is asleep promptly and wakes for one nocturia between 2-3 AM, she rises at 4.30 and an  Alarm is needed. She is at work at 7 AM- she works in an [employer] office.    Sleep medical history and family sleep history:  Daughter has OSA, insomnia. No sleep walking history, no shift work history.  Social history:  Needs to continue working part time at [employer], [employer]( 2000) . Widowed 2004, had not enough income- went back to work. 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 Non smoker, non ETOH, caffeine use, 1-2 mugs in AM- no soda but 1-2 glasses of  iced tea- at lunch and in PM.    Review of Systems: Out of a complete 14 system review, the patient complains of only the following  symptoms, and all other reviewed systems are negative. GERD, overactive bladder, bronchitis recurrent. Urge incontinence.   Reports snoring, apnea, nocturia, sharp pain and spasms in her lateral neck muscles after yawning- locking in.    Epworth score 11  , Fatigue severity score 15  , depression score 2/ 15 pre CPAP,   post CPAP, no longer nocturia.  Besides resolution of nocturia and daytime sleepiness this is also weaker reflected in a fatigue severity scale of 15 points and Epworth Sleepiness Scale of only 6 points, geriatric depression score was endorsed 2 out of 15 points.  Social History   Socioeconomic History  . Marital status: Widowed    Spouse name: Not on file  . Number of children: Not on file  . Years of education: Not on file  . Highest education level: Not  on file  Occupational History  . Not on file  Social Needs  . Financial resource strain: Not on file  . Food insecurity:    Worry: Not on file    Inability: Not on file  . Transportation needs:    Medical: Not on file    Non-medical: Not on file  Tobacco Use  . Smoking status: Never Smoker  . Smokeless tobacco: Never Used  Substance and Sexual Activity  . Alcohol use: Not Currently  . Drug use: Not Currently  . Sexual activity: Not on file  Lifestyle  . Physical activity:    Days per week: Not on file    Minutes per session: Not on file  . Stress: Not on file  Relationships  . Social connections:    Talks on phone: Not on file    Gets together: Not on file    Attends religious service: Not on file    Active member of club or organization: Not on file    Attends meetings of clubs or organizations: Not on file    Relationship status: Not on file  . Intimate partner violence:    Fear of current or ex partner: Not on file    Emotionally abused: Not on file    Physically abused: Not on file    Forced sexual activity: Not on file  Other Topics Concern  . Not on file  Social History Narrative  . Not on file    Family History  Problem Relation Age of Onset  . Heart failure Mother   . Hypertension Mother   . Hypertension Brother   . Diabetes Brother   . Pneumonia Father   . Diabetes Father     Past Medical History:  Diagnosis Date  . GERD (gastroesophageal reflux disease)   . 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 Hx of retinal hemorrhage   . Hyperlipemia   . Stroke [facility])     Past Surgical History:  Procedure Laterality Date  . childbirth     x 2    Current Outpatient Medications  Medication Sig Dispense Refill  . atorvastatin (LIPITOR) 20 MG tablet Take 20 mg by mouth daily.    Marland Kitchen omeprazole (PRILOSEC) 20 MG capsule Take by mouth.     No current facility-administered medications for this visit.     Allergies as of 12/14/2018 - Review Complete 12/14/2018   Allergen Reaction Noted  . Sulfa antibiotics  03/29/2012    Vitals: BP (!) 143/80   Pulse 69   Ht 5\' 2"  (1.575 m)   Wt 169 lb (76.7 kg)   BMI 30.91 kg/m  Last Weight:  Wt  Readings from Last 1 Encounters:  12/14/18 169 lb (76.7 kg)   TAV:WPVX mass index is 30.91 kg/m.     Last Height:   Ht Readings from Last 1 Encounters:  12/14/18 5\' 2"  (1.575 m)    Physical exam:  General: The patient is awake, alert and appears not in acute distress. The patient is well groomed. Head: Normocephalic, atraumatic. Neck is supple. Mallampati 3- u irritated soft palate- red and puffy.   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  .  Cardiovascular:  Regular rate and rhythm , without  murmurs or carotid bruit, and without distended neck veins. Respiratory: Lungs are clear to auscultation. Skin:  Without evidence of edema, or rash Trunk: BMI is 30.91 kg/m2 - gained weight !   Neurologic exam : The patient is awake and alert, oriented to place and time.   Speech is fluent, with dysphonia .  No temporal artery induration noted.  Mood and affect are appropriate.  Cranial nerves: Pupils are equal and briskly reactive to light. Funduscopic exam without evidence of pallor or edema.  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   Extraocular movements in vertical and horizontal planes intact and without nystagmus. Visual fields by finger perimetry :The bottom quadrant left has a scotoma- visual defect.   Hearing to finger rub intact.  Facial sensation intact to fine touch. Facial motor strength is symmetric and tongue and uvula move midline. Shoulder shrug was symmetrical.   Motor exam:  Normal tone, muscle bulk and symmetric strength in all extremities. Sensory:  Fine touch, pinprick and vibration were normal. Coordination: without evidence of ataxia, dysmetria or tremor. Gait and station: Patient walks without assistive device.  Deep tendon reflexes:  in the  upper and lower extremities are symmetric and intact.   Assessment:  After physical and neurologic examination, review of laboratory studies,  Personal review of imaging studies, reports of other /same  Imaging studies, results of polysomnography and / or neurophysiology testing and pre-existing records as far as provided in visit., my assessment is   1) OSA . Moderate, uncomplicated .  She also has quite significant retrognathia and some weight gain- toleratees auto CPAP well with a dreamwear set up. She  may switch to bella swift with ear loops next time when head gear is due.     Plan:  Treatment plan and additional workup :  I like for the patient to continue CPAP use and watch her carbohydrate intake. I will give her a booklet about improving her sleep.   Larey Seat, MD 03/13/164, 5:37 AM  Certified in Neurology by ABPN Certified in [place] by [facility] [address] [place], [place] [address]

## 2019-12-18 ENCOUNTER — Ambulatory Visit: Payer: Medicare Other | Admitting: Neurology

## 2019-12-18 ENCOUNTER — Encounter: Payer: Self-pay | Admitting: Neurology

## 2019-12-18 ENCOUNTER — Other Ambulatory Visit: Payer: Self-pay

## 2019-12-18 VITALS — BP 135/80 | HR 67 | Temp 97.5°F | Ht 62.0 in | Wt 162.0 lb

## 2019-12-18 DIAGNOSIS — R4189 Other symptoms and signs involving cognitive functions and awareness: Secondary | ICD-10-CM

## 2019-12-18 DIAGNOSIS — M62838 Other muscle spasm: Secondary | ICD-10-CM | POA: Diagnosis not present

## 2019-12-18 DIAGNOSIS — G4730 Sleep apnea, unspecified: Secondary | ICD-10-CM

## 2019-12-18 DIAGNOSIS — Z72821 Inadequate sleep hygiene: Secondary | ICD-10-CM

## 2019-12-18 DIAGNOSIS — G4710 Hypersomnia, unspecified: Secondary | ICD-10-CM

## 2019-12-18 DIAGNOSIS — G4733 Obstructive sleep apnea (adult) (pediatric): Secondary | ICD-10-CM | POA: Diagnosis not present

## 2019-12-18 DIAGNOSIS — Z9989 Dependence on other enabling machines and devices: Secondary | ICD-10-CM

## 2019-12-18 NOTE — Patient Instructions (Signed)

## 2019-12-18 NOTE — Progress Notes (Signed)
SLEEP MEDICINE CLINIC   Provider:  Larey Seat, MD    Primary Care Physician:  Haywood Pao, MD   Referring Provider:  Dr. Domenick Gong, MD  Chief Complaint  Patient presents with  . Follow-up    pt alone, rm 11. [facility] [employer].    Interval history from 12-17-2018: I have the pleasure of meeting Leah Knight today she is minimal 74 years of age, a patient of Dr. Domenick Gong and has been followed here for hypersomnia with sleep apnea.  The patient uses CPAP and brought her machine to this appointment her compliance has been sporadic since October over the last 90 days but over the last 30 days she has used the machine 15/50 percent her average daily use a time when used is 5 hours 30 minutes she is using an AutoSet between 5 and 15 cm water pressure with 3 cm EPR and reached at desirable residual AHI of only 2.7/h she has some central apneas emerging according to her report the 95th percentile pressure for this patient is 13.3 and she has moderate air leakage.  Similar data are available for the 90-day download which encompasses the time between 30 October and 10 January.  The patient endorsed the Epworth Sleepiness Scale at 4 the fatigue severity score at 9 and the geriatric depression score at 3 out of 15 points. She reports falling asleep before she puts CPAP on. Her mask is a nasal pillow. She has started to sleep supine, elevated, and she wakes with a dry mouth. We discussed a chin strap which has been unappealing to her.    Interval history from 14 December 2018.  I have the pleasure of seeing Leah Knight today in a follow-up from her home sleep study on watch [name] performed on May 31, 2018.  The home sleep test had revealed that she had moderate sleep apnea with an AHI of 22.5 her RDI was at the same level the oxygen nadir was 70% of total time in oxygenation below 89% for 6.3 minutes her average heart rate was 72 bpm and regular.  I recommended treatment with  CPAP and she started on an autotitrator for the last 30 days she has reached a compliance of 75% by time and 87% by days.  This is sufficient for the Medicare criteria.  Her average user time on days used is 5 hours and 4 minutes.  The machine is set between 5 and 15 cmH2O with 3 cm expiratory pressure relief.  The residual apnea hypopnea index is 1.2/h.  Pressure at the 95th percentile is 13.7 cm, she has mild air leaks. She is using nasal pillows, does not want a full face mask, has retrognathia.  She feel more rested, not sleepy and attentive at her job. "CPAP works for me ".    HPI:  Leah Knight is a 74 y.o. female , seen here in a referral from Dr. Osborne Casco for a sleep evaluation.   Chief complaint according to patient : [name] is a 74 year old Caucasian right-handed female who presents today for excessive daytime sleepiness stating that she sometimes gets very tired when she drives home.  Her daughter has also witnessed her sleep pattern and has stated that she snores and breathes irregularly.  There is a high suspicion that she may have obstructive sleep apnea.  Sleep habits are as follows: watches Tv in the living room in a recliner where she tends to fall asleep around 9 PM. She goes to  bed around 11, her bedroom is cool, quiet and dark.  She usually prefers to sleep on her sides but she often wakes up on her back.  She sleeps in a flat bed, on one single pillow for head. Some nights she will prop up with more pillows but this is the exception. The patient sleeps alone, lives alone and has no pets. She will watch Tv in the bedroom when she has trouble to go to sleep. Usually she is asleep promptly and wakes for one nocturia between 2-3 AM, she rises at 4.30 and an  Alarm is needed. She is at work at 7 AM- she works in an [employer] office.    Sleep medical history and family sleep history:  Daughter has OSA, insomnia. No sleep walking history, no shift work history.  Social history:   Needs to continue working part time at [employer], [employer]( 2000) . Widowed 2004, had not enough income- went back to work. 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 Non smoker, non ETOH, caffeine use, 1-2 mugs in AM- no soda but 1-2 glasses of  iced tea- at lunch and in PM.    Review of Systems: Out of a complete 14 system review, the patient complains of only the following symptoms, and all other reviewed systems are negative. GERD, overactive bladder, bronchitis recurrent. Urge incontinence.   Reports snoring, apnea, nocturia, sharp pain and spasms in her lateral neck muscles after yawning- locking in.  How likely are you to doze in the following situations: 0 = not likely, 1 = slight chance, 2 = moderate chance, 3 = high chance  Sitting and Reading? Watching Television? Sitting inactive in a public place (theater or meeting)? Lying down in the afternoon when circumstances permit? Sitting and talking to someone? Sitting quietly after lunch without alcohol? In a car, while stopped for a few minutes in traffic? As a passenger in a car for an hour without a break?  Total = 4/ 24 points     Epworth score 4 from 11  , Fatigue severity score 15/ 63   , depression score 2/ 15 pre CPAP,   post CPAP, no longer nocturia.  Besides resolution of nocturia and daytime sleepiness this is also weaker reflected in a fatigue severity scale of 15 points and Epworth Sleepiness Scale of only 6 points, geriatric depression score was endorsed 2 out of 15 points.  Social History   Socioeconomic History  . Marital status: Widowed    Spouse name: Not on file  . Number of children: Not on file  . Years of education: Not on file  . Highest education level: Not on file  Occupational History  . Not on file  Tobacco Use  . Smoking status: Never Smoker  . Smokeless tobacco: Never Used  Substance and Sexual Activity  . Alcohol use: Not Currently  . Drug use: Not Currently  . Sexual activity: Not on file  Other  Topics Concern  . Not on file  Social History Narrative  . Not on file   Social Determinants of Health   Financial Resource Strain:   . Difficulty of Paying Living Expenses: Not on file  Food Insecurity:   . Worried About Charity fundraiser in the Last Year: Not on file  . Ran Out of Food in the Last Year: Not on file  Transportation Needs:   . Lack of Transportation (Medical): Not on file  . Lack of Transportation (Non-Medical): Not on file  Physical Activity:   .  Days of Exercise per Week: Not on file  . Minutes of Exercise per Session: Not on file  Stress:   . Feeling of Stress : Not on file  Social Connections:   . Frequency of Communication with Friends and Family: Not on file  . Frequency of Social Gatherings with Friends and Family: Not on file  . Attends Religious Services: Not on file  . Active Member of Clubs or Organizations: Not on file  . Attends Archivist Meetings: Not on file  . Marital Status: Not on file  Intimate Partner Violence:   . Fear of Current or Ex-Partner: Not on file  . Emotionally Abused: Not on file  . Physically Abused: Not on file  . Sexually Abused: Not on file    Family History  Problem Relation Age of Onset  . Heart failure Mother   . Hypertension Mother   . Hypertension Brother   . Diabetes Brother   . Pneumonia Father   . Diabetes Father     Past Medical History:  Diagnosis Date  . GERD (gastroesophageal reflux disease)   . 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 Hx of retinal hemorrhage   . Hyperlipemia   . Stroke [facility])     Past Surgical History:  Procedure Laterality Date  . childbirth     x 2    Current Outpatient Medications  Medication Sig Dispense Refill  . acetaminophen (TYLENOL) 650 MG CR tablet Take 650 mg by mouth every 8 (eight) hours as needed for pain.    Marland Kitchen amoxicillin-clavulanate (AUGMENTIN) 875-125 MG tablet Take 1 tablet by mouth 2 (two) times daily.    Marland Kitchen atorvastatin (LIPITOR) 10 MG tablet  Take 10 mg by mouth daily.     . methylPREDNISolone (MEDROL DOSEPAK) 4 MG TBPK tablet Take 4 mg by mouth.    Marland Kitchen omeprazole (PRILOSEC) 20 MG capsule Take by mouth.    . propranolol (INDERAL) 40 MG tablet Take 40 mg by mouth daily.    . Turmeric (QC TUMERIC COMPLEX PO) Take 1 tablet by mouth daily.     No current facility-administered medications for this visit.    Allergies as of 12/18/2019 - Review Complete 12/18/2019  Allergen Reaction Noted  . Sulfa antibiotics  03/29/2012    Vitals: BP 135/80   Pulse 67   Temp (!) 97.5 F (36.4 C)   Ht 5\' 2"  (1.575 m)   Wt 162 lb (73.5 kg)   BMI 29.63 kg/m  Last Weight:  Wt Readings from Last 1 Encounters:  12/18/19 162 lb (73.5 kg)   [id number] mass index is 29.63 kg/m.     Last Height:   Ht Readings from Last 1 Encounters:  12/18/19 5\' 2"  (1.575 m)    Physical exam:  General: The patient is awake, alert and appears not in acute distress. The patient is well groomed. Head: Normocephalic, atraumatic. Neck is supple. Mallampati 3- u irritated soft palate- red and puffy.   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  .  Cardiovascular:  Regular rate and rhythm , without  murmurs or carotid bruit, and without distended neck veins. Respiratory: Lungs are clear to auscultation. Skin:  Without evidence of edema, or rash Trunk: BMI is 29 kg/m2 - gained weight !   Neurologic exam : The patient is awake and alert, oriented to place and time.   Speech is fluent, with dysphonia .  No temporal artery induration noted.  Mood  and affect are appropriate.  Cranial nerves: Pupils are equal and briskly reactive to light. Funduscopic exam without evidence of pallor or edema.  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   Extraocular movements in vertical and horizontal planes intact and without nystagmus. Visual fields by finger perimetry : The bottom quadrant left has a scotoma- visual defect.   Hearing  to finger rub intact.  Facial sensation intact to fine touch.  Facial motor strength remains symmetric and tongue and uvula move midline.  Shoulder shrug is symmetrical.   Motor exam:  Normal tone, muscle bulk and symmetric strength in all extremities. Sensory:  Fine touch, pinprick and vibration were normal. Coordination: without evidence of ataxia, dysmetria or tremor. Gait and station: Patient walks without assistive device.  Deep tendon reflexes: in the  upper and lower extremities are symmetric and intact.   Assessment:  After physical and neurologic examination, review of laboratory studies,  Personal review of imaging studies, reports of other /same  Imaging studies, results of polysomnography and / or neurophysiology testing and pre-existing records as far as provided in visit., my assessment is   1) OSA . Moderate, uncomplicated .  She also has quite significant retrognathia and some weight gain- tolerates auto CPAP well with a dreamwear set up. She may switch to bella swift with ear loops next time when head gear is due- she will need to inform me if her prescription should change. I will also ask her to adjust her humidifier settings. .     Plan:  Treatment plan and additional workup : Poor compliance - 15/ 30 days.  I like for the patient to continue CPAP use and watch her carbohydrate intake. She has been looking forward to a [place] program for medical weight loss.  I will give her a booklet about improving her sleep again- the boot camp. Rv in 12 month with NP.     Larey Seat, MD [id number], [id number] AM  Certified in Neurology by [employer] Certified in [place] by [facility] [address], [place], [place] [address]

## 2020-12-18 ENCOUNTER — Telehealth: Payer: Self-pay | Admitting: Neurology

## 2020-12-18 ENCOUNTER — Ambulatory Visit: Payer: Medicare Other | Admitting: Neurology

## 2020-12-18 ENCOUNTER — Other Ambulatory Visit: Payer: Self-pay

## 2020-12-18 ENCOUNTER — Encounter: Payer: Self-pay | Admitting: Neurology

## 2020-12-18 VITALS — BP 122/79 | HR 57 | Ht 62.0 in | Wt 163.5 lb

## 2020-12-18 DIAGNOSIS — Z9114 Patient's other noncompliance with medication regimen: Secondary | ICD-10-CM | POA: Diagnosis not present

## 2020-12-18 DIAGNOSIS — G4733 Obstructive sleep apnea (adult) (pediatric): Secondary | ICD-10-CM | POA: Diagnosis not present

## 2020-12-18 DIAGNOSIS — Z8673 Personal history of transient ischemic attack (TIA), and cerebral infarction without residual deficits: Secondary | ICD-10-CM

## 2020-12-18 DIAGNOSIS — Z72821 Inadequate sleep hygiene: Secondary | ICD-10-CM | POA: Diagnosis not present

## 2020-12-18 DIAGNOSIS — Z91199 Patient's noncompliance with other medical treatment and regimen due to unspecified reason: Secondary | ICD-10-CM

## 2020-12-18 DIAGNOSIS — Z9989 Dependence on other enabling machines and devices: Secondary | ICD-10-CM

## 2020-12-18 DIAGNOSIS — Z8669 Personal history of other diseases of the nervous system and sense organs: Secondary | ICD-10-CM | POA: Diagnosis not present

## 2020-12-18 NOTE — Progress Notes (Signed)
SLEEP MEDICINE CLINIC   Provider:  Larey Seat, MD    Primary Care Physician:  Leah Pao, MD   Referring Provider:  Dr. Domenick Gong, MD  Chief Complaint  Patient presents with  . Follow-up    Yearly CPAP follow up. Some nights patient reports trouble getting the mask on her head and it seems to not fit right.     Interval history from 18 December 2020.  I have the pleasure of meeting [name] today, she is a 75 year old Caucasian female on CPAP user.  She has had some changes in her medication now including a hypertension medication losartan, she is also taking calcium supplements she still takes Inderal, Prilosec, Lipitor and Tylenol as needed.  Current weight is 163 pounds she has lost weight since I last saw her.  She did endorse 3 points on the geriatric depression score which is not indicative of depression to be present her fatigue severity score is only 11 points which is low and her Epworth sleepiness score is endorsed at 13 point which is above average.  I had the opportunity to look at her compliance report from her CPAP machine which is an AutoSet and she has used her machine since January last 2385 hours.  She just began using the machine again at around [facility] and on days used she averages 7 hours 59 minutes.  However at the end of last year she had not used her machine and she noted that air leakage escaping from her facial mask has been a discomfort.  Her machine is set at a minimum of 5 maximum of 15 cm of pressure was 3 cm expiratory pressure relief.  Residual AHI is 1.9/h which speaks for excellent resolution of the apnea.  95th percentile pressure is 14.3 cmH2O the air leakage at the 95th percentile is 14.9 L/min.  Minimal air escape however it wakes and bothers the patient.  Another download provided over 90 days shows an average use at time of 6 hours 5 minutes and an AHI of 1.8.  The air leakage at the 95th percentile was 17.3 L/min here  even a little higher.  She did get replacement pillows for her mask.  She has never been refitted for possible different mask altogether.   Her DME has also contacted her to replace parts-  in the spite of the clear gaps in her CPAP use. [employer] is now [facility]-     Interval history from 12-17-2018:I have the pleasure of meeting Leah Knight today she is minimal 75 years of age, a patient of Dr. Domenick Knight and has been followed here for hypersomnia with sleep apnea.  The patient uses CPAP and brought her machine to this appointment her compliance has been sporadic since October over the last 90 days but over the last 30 days she has used the machine 15/50 percent her average daily use a time when used is 5 hours 30 minutes she is using an AutoSet between 5 and 15 cm water pressure with 3 cm EPR and reached at desirable residual AHI of only 2.7/h she has some central apneas emerging according to her report the 95th percentile pressure for this patient is 13.3 and she has moderate air leakage.  Similar data are available for the 90-day download which encompasses the time between 30 October and 10 January.  The patient endorsed the Epworth Sleepiness Scale at 4 the fatigue severity score at 9 and the geriatric depression score at  3 out of 15 points.She reports falling asleep before she puts CPAP on. Her mask is a nasal pillow. She has started to sleep supine, elevated, and she wakes with a dry mouth. We discussed a chin strap which has been unappealing to her.    Interval history from 14 December 2018.  I have the pleasure of seeing Leah Knight today in a follow-up from her home sleep study on watch Leah Knight performed on May 31, 2018.  The home sleep test had revealed that she had moderate sleep apnea with an AHI of 22.5 her RDI was at the same level the oxygen nadir was 70% of total time in oxygenation below 89% for 6.3 minutes her average heart rate was 72 bpm and regular.  I recommended treatment  with CPAP and she started on an autotitrator for the last 30 days she has reached a compliance of 75% by time and 87% by days.  This is sufficient for the Medicare criteria.  Her average user time on days used is 5 hours and 4 minutes.  The machine is set between 5 and 15 cmH2O with 3 cm expiratory pressure relief.  The residual apnea hypopnea index is 1.2/h.  Pressure at the 95th percentile is 13.7 cm, she has mild air leaks. She is using nasal pillows, does not want a full face mask, has retrognathia.  She feel more rested, not sleepy and attentive at her job. "CPAP works for me ".    HPI:  Leah Knight is a 75 y.o. female , seen here in a referral from Dr. Osborne Knight for a sleep evaluation. Chief complaint according to patient : Leah Knight is a 75 year old Caucasian right-handed female who presents today for excessive daytime sleepiness stating that she sometimes gets very tired when she drives home.  Her daughter has also witnessed her sleep pattern and has stated that she snores and breathes irregularly.  There is a high suspicion that she may have obstructive sleep apnea.  Sleep habits are as follows: watches Tv in the living room in a recliner where she tends to fall asleep around 9 PM. She goes to bed around 11, her bedroom is cool, quiet and dark.  She usually prefers to sleep on her sides but she often wakes up on her back.  She sleeps in a flat bed, on one single pillow for head. Some nights she will prop up with more pillows but this is the exception. The patient sleeps alone, lives alone and has no pets. She will watch Tv in the bedroom when she has trouble to go to sleep. Usually she is asleep promptly and wakes for one nocturia between 2-3 AM, she rises at 4.30 and an  Alarm is needed. She is at work at 7 AM- she works in an [employer] office.   Sleep medical history and family sleep history:  Daughter has OSA, insomnia. No sleep walking history, no shift work history. Social history:   Needs to continue working part time at [employer], [employer]( 2000) . Widowed 2004, had not enough income- went back to work. 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 Non smoker, non ETOH, caffeine use, 1-2 mugs in AM- no soda but 1-2 glasses of  iced tea- at lunch and in PM.    Review of Systems: Out of a complete 14 system review, the patient complains of only the following symptoms, and all other reviewed systems are negative. GERD, overactive bladder, bronchitis recurrent. Urge incontinence.   Reports snoring, apnea, nocturia, sharp  pain and spasms in her lateral neck muscles after yawning- locking in.  How likely are you to doze in the following situations: 0 = not likely, 1 = slight chance, 2 = moderate chance, 3 = high chance  Sitting and Reading? Watching Television? Sitting inactive in a public place (theater or meeting)? Lying down in the afternoon when circumstances permit? Sitting and talking to someone? Sitting quietly after lunch without alcohol? In a car, while stopped for a few minutes in traffic? As a passenger in a car for an hour without a break?  Total = 4/ 24 points    She did endorse 3 points on the geriatric depression score which is not indicative of depression to be present her fatigue severity score is only 11 points which is low and her Epworth sleepiness score is endorsed at 13 point which is above average.   post CPAP, no longer nocturia.  Social History   Socioeconomic History  . Marital status: Widowed    Spouse name: Not on file  . Number of children: 2  . Years of education: Not on file  . Highest education level: Not on file  Occupational History  . Not on file  Tobacco Use  . Smoking status: Never Smoker  . Smokeless tobacco: Never Used  Substance and Sexual Activity  . Alcohol use: Not Currently  . Drug use: Not Currently  . Sexual activity: Not on file  Other Topics Concern  . Not on file  Social History Narrative   Lives alone    Right  handed   Caffeine: 1-2 cups/day   Social Determinants of Health   Financial Resource Strain: Not on file  Food Insecurity: Not on file  Transportation Needs: Not on file  Physical Activity: Not on file  Stress: Not on file  Social Connections: Not on file  Intimate Partner Violence: Not on file    Family History  Problem Relation Age of Onset  . Heart failure Mother   . Hypertension Mother   . Hypertension Brother   . Diabetes Brother   . Pneumonia Father   . Diabetes Father     Past Medical History:  Diagnosis Date  . GERD (gastroesophageal reflux disease)   . 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 Hx of retinal hemorrhage   . Hyperlipemia   . Stroke [facility])     Past Surgical History:  Procedure Laterality Date  . CATARACT EXTRACTION, BILATERAL  summer 2021  . childbirth     x 2    Current Outpatient Medications  Medication Sig Dispense Refill  . Acetaminophen (ARTHRITIS PAIN PO) Take 1,000 mg by mouth daily.    Marland Kitchen atorvastatin (LIPITOR) 10 MG tablet Take 10 mg by mouth daily.     Marland Kitchen CALCIUM PO Take 600 mg by mouth.    . Cholecalciferol (VITAMIN D3 PO) Take 50 mg by mouth.    . losartan (COZAAR) 50 MG tablet Take 50 mg by mouth daily.    Marland Kitchen omeprazole (PRILOSEC) 20 MG capsule Take by mouth.    . propranolol (INDERAL) 40 MG tablet Take 40 mg by mouth daily.     No current facility-administered medications for this visit.    Allergies as of 12/18/2020 - Review Complete 12/18/2020  Allergen Reaction Noted  . Sulfa antibiotics  03/29/2012    Vitals: BP 122/79 (BP Location: Left Arm, Patient Position: Sitting)   Pulse (!) 57   Wt 163 lb 8 oz (74.2 kg) Comment: pt reported,  weighs at home and on diet currently  BMI 29.90 kg/m   Last Weight:  Wt Readings from Last 1 Encounters:  12/18/20 163 lb 8 oz (74.2 kg)   [id number] mass index is 29.9 kg/m.       Last Height:   Ht Readings from Last 1 Encounters:  12/18/19 5\' 2"  (1.575 m)    Physical  exam:  General: The patient is awake, alert and appears not in acute distress. The patient is well groomed. Head: Normocephalic, atraumatic. Neck is supple. Mallampati 3- u irritated soft palate- red and puffy.   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  .  Cardiovascular:  Regular rate and rhythm , without  murmurs or carotid bruit, and without distended neck veins. Respiratory: Lungs are clear to auscultation. Skin:  Without evidence of edema, or rash Trunk: BMI is 29 kg/m2 - gained weight !   Neurologic exam : The patient is awake and alert, oriented to place and time.   Speech is fluent, with dysphonia .  No temporal artery induration noted.  Mood and affect are appropriate.  Cranial nerves: Pupils are equal and briskly reactive to light. Funduscopic exam without evidence of pallor or edema.  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  Extraocular movements in vertical and horizontal planes intact and without nystagmus. Visual fields by finger perimetry : The bottom quadrant left has a scotoma- visual defect.   Facial motor strength remains symmetric and tongue and uvula move midline.  Shoulder shrug is symmetrical.   Motor exam:  Normal tone, muscle bulk and symmetric strength in all extremities. Sensory:  Fine touch, pinprick and vibration were normal. Coordination: without evidence of ataxia, dysmetria or tremor. Gait and station: Patient walks without assistive device.  Deep tendon reflexes: in the  upper and lower extremities are symmetric and intact.   Assessment:  After physical and neurologic examination, review of laboratory studies,  Personal review of imaging studies, reports of other /same  Imaging studies, results of polysomnography and / or neurophysiology testing and pre-existing records as far as provided in visit., my assessment is : Non compliance.   1) OSA . Moderate, uncomplicated .  She had become non-compliant  , related to airleaks from her full facial mask- may be related to loss of weight, too.  She lost 14 pounds..   She also has quite significant retrognathia and some weight gain- tolerates auto CPAP well with a dreamwear set up.  then her airleaks began bothering her. This is a nasal cradle.   She may switch to bella swift with ear loops next time when head gear is due- she will need to inform me if her prescription should change. I will also ask her to adjust her humidifier settings.  I fitted her in an extra session here today - total time 25 minutes.      Plan:  Treatment plan and additional workup : Poor compliance - 15/ 30 days- this is a chronic condition. I fitted her for an EVORA mask in SMALL by [employer] /  She is seemingly depressed or at least unmotivated . Poor compliance and no message to [place] or to DME about her problems. .  I like for the patient to continue CPAP use and watch her carbohydrate intake.   She has been looking forward to a [place] program for medical weight loss.  I will give her a booklet about improving her sleep again- the  boot camp.  Rv in 6  month with NP.      Leah Seat, MD 06/15/6578, 03:83 AM  Certified in Neurology by ABPN Certified in [place] by [facility] [address] [place], [place] [address]

## 2020-12-18 NOTE — Progress Notes (Signed)
Geriatric Depression Scale: Short Form Score was 3  Epworth Sleepiness Scale 0= would never doze 1= slight chance of dozing 2= moderate chance of dozing 3= high chance of dozing  Sitting and reading: 3 Watching TV: 2 Sitting inactive in a public place (ex. Theater or meeting): 1 As a passenger in a car for an hour without a break: 2 Lying down to rest in the afternoon: 2 Sitting and talking to someone: 0 Sitting quietly after lunch (no alcohol): 2 In a car, while stopped in traffic: 0 Total: 12  FSS was 11

## 2020-12-18 NOTE — Telephone Encounter (Addendum)
Error called pt in error

## 2020-12-18 NOTE — Progress Notes (Signed)
Sent community message to [place] re: CPAP supplies .

## 2020-12-18 NOTE — Patient Instructions (Signed)

## 2021-05-28 ENCOUNTER — Ambulatory Visit: Payer: Medicare Other | Admitting: Gastroenterology

## 2021-05-28 ENCOUNTER — Other Ambulatory Visit: Payer: Self-pay

## 2021-05-28 VITALS — BP 130/74 | HR 71 | Ht 62.0 in | Wt 168.6 lb

## 2021-05-28 DIAGNOSIS — R1319 Other dysphagia: Secondary | ICD-10-CM | POA: Diagnosis not present

## 2021-05-28 DIAGNOSIS — K219 Gastro-esophageal reflux disease without esophagitis: Secondary | ICD-10-CM

## 2021-05-28 MED ORDER — PANTOPRAZOLE SODIUM 40 MG PO TBEC: 40.0000 mg | DELAYED_RELEASE_TABLET | Freq: Every day | ORAL | 3 refills | Status: DC

## 2021-05-28 MED ORDER — OMEPRAZOLE 40 MG PO CPDR: 40.0000 mg | DELAYED_RELEASE_CAPSULE | Freq: Every day | ORAL | 3 refills | Status: DC

## 2021-05-28 NOTE — Patient Instructions (Addendum)
It was my pleasure to provide care to you you today. Based on our discussion, I am providing you with my recommendations below:  RECOMMENDATION(S):   We have sent the following medication(s) to your pharmacy:  Omeprazole - Please take 40mg  by mouth daily  NOTE: If your medication(s) requires a PRIOR AUTHORIZATION, we will receive notification from your pharmacy. Once received, the process to submit for approval may take up to 7-10 business days. You will be contacted about any denials we have received from your insurance company as well as alternatives recommended by your provider.  COLONOSCOPY AND ENDOSCOPY:   You have been scheduled for an endoscopy and a colonoscopy. Please follow the written instructions given to you at your visit today.  PREP:   Please pick up your prep supplies at the pharmacy within the next 1-3 days.  INHALERS:   If you use inhalers (even only as needed), please bring them with you on the day of your procedure.  COLONOSCOPY TIPS:  To reduce nausea and dehydration, stay well hydrated for 3-4 days prior to the exam.  To prevent skin/hemorrhoid irritation - prior to wiping, put A&Dointment or vaseline on the toilet paper. Keep a towel or pad on the bed.  BEFORE STARTING YOUR PREP, drink  64oz of clear liquids in the morning. This will help to flush the colon and will ensure you are well hydrated!!!!  NOTE - This is in addition to the fluids required for to complete your prep. Use of a flavored hard candy, such as grape Anise Salvo, can counteract some of the flavor of the prep and may prevent some nausea.   FOLLOW UP:  After your procedure, you will receive a call from my office staff regarding my recommendation for follow up.  BMI:  If you are age 54 or older, your body mass index should be between 23-30. Your There is no height or weight on file to calculate BMI. If this is out of the aforementioned range listed, please consider follow up with your  Primary Care Provider.  MY CHART:  The [facility] providers would like to encourage you to use [facility] to communicate with providers for non-urgent requests or questions.  Due to long hold times on the telephone, sending your provider a message by [facility] may be a faster and more efficient way to get a response.  Please allow 48 business hours for a response.  Please remember that this is for non-urgent requests.   Thank you for trusting me with your gastrointestinal care!    Thornton Park, MD, MPH

## 2021-05-28 NOTE — Progress Notes (Signed)
Referring Provider: Haywood Pao, MD Primary Care Physician:  Haywood Pao, MD  Reason for Consultation:  Colon cancer screening, swallowing trouble   IMPRESSION:  Dysphagia - ? Esophageal versus oropharyngeal dysphagia    - present x years    - previously evaluated by ENT    - primarily described as difficulty initiating her swallow    - worse with liquids, although intentionally chewing solids well Rare reflux not requiring medications Recent change in bowel habitsx a few weeks Need for colon cancer screening  PLAN: - Increase omeprazole to 40 mg daily given her GERD and possible GERD related dysmotility - EGD with esophageal biopsies - Obtain records from ENT to determine next step to evaluate for oropharyngeal dysphagia if EGD is nondiagnostic for esophageal causes - Colonoscopy for colon cancer screening  Please see the "Patient Instructions" section for addition details about the plan.  HPI: Leah Knight is a 75 y.o. female referred by Dr. Osborne Casco for colon cancer screening. Office visit request for dysphagia. The history is obtained through the patient and review of her electronic health record.  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 She is a retired Museum/gallery conservator.  Many years of difficulty initiating a swallow. Frequent strangling sensation, particularly with liquids. Occurs most days.  No odynophagia. No esophageal symptoms.  Rare heartburn not requiring medications. No regurgitation, sore throat, neck pain. Recent intentional weight loss. No blood in the stool. Has chronic early satiety, gas, intermittent reflux, and altered bowel habits with some days have 3 BM daily. Noticed that the stools are lighter in color and softer over the last few weeks.    Seen by ENT for dysphagia 4-5 years ago. She can't remember the investigation, results, or recommendations.   Prior colonoscopy in [place] 10 years ago. She remembers the results being normal.  No known family history of colon cancer or polyps. No family history of uterine/endometrial cancer, pancreatic cancer or gastric/stomach cancer.  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     atorvastatin (LIPITOR) 10 MG tablet Take 10 mg by mouth daily.      CALCIUM PO Take 600 mg by mouth.     Cholecalciferol (VITAMIN D3 PO) Take 50 mg by mouth.     losartan (COZAAR) 50 MG tablet Take 50 mg by mouth daily.     omeprazole (PRILOSEC) 20 MG capsule Take by mouth.     propranolol (INDERAL) 40 MG tablet Take 40 mg by mouth daily.     No current facility-administered medications for this visit.    Allergies as of 05/28/2021 - Review Complete 12/18/2020  Allergen Reaction Noted   Sulfa antibiotics  03/29/2012    Family History  Problem Relation  Age of Onset   Heart failure Mother    Hypertension Mother    Hypertension Brother    Diabetes Brother    Pneumonia Father    Diabetes Father        Review of Systems: 12 system ROS is negative except as noted above with the additions of arthritis, itching, excessive urination and urine leakage.   Physical Exam: General:   Alert,  well-nourished, pleasant and cooperative in NAD Head:  Normocephalic  and atraumatic. Eyes:  Sclera clear, no icterus.   Conjunctiva pink. Ears:  Normal auditory acuity. Nose:  No deformity, discharge,  or lesions. Mouth:  No deformity or lesions.   Neck:  Supple; no masses or thyromegaly. Lungs:  Clear throughout to auscultation.   No wheezes. Heart:  Regular rate and rhythm; no murmurs. Abdomen:  Soft,nontender, nondistended, normal bowel sounds, no rebound or guarding. No hepatosplenomegaly.   Neurologic:  Alert and  oriented x4;  grossly nonfocal Skin:  Intact without significant lesions or rashes. Psych:  Alert and cooperative. Normal mood and affect.     Vivion Romano L. Tarri Glenn, MD, MPH 05/28/2021, 8:49 AM

## 2021-06-18 ENCOUNTER — Encounter: Payer: Self-pay | Admitting: Neurology

## 2021-06-18 ENCOUNTER — Ambulatory Visit: Payer: Medicare Other | Admitting: Neurology

## 2021-06-18 VITALS — BP 140/81 | HR 61 | Ht 62.0 in | Wt 169.5 lb

## 2021-06-18 DIAGNOSIS — G4733 Obstructive sleep apnea (adult) (pediatric): Secondary | ICD-10-CM

## 2021-06-18 DIAGNOSIS — Z9114 Patient's other noncompliance with medication regimen: Secondary | ICD-10-CM

## 2021-06-18 DIAGNOSIS — Z9989 Dependence on other enabling machines and devices: Secondary | ICD-10-CM

## 2021-06-18 DIAGNOSIS — Z8669 Personal history of other diseases of the nervous system and sense organs: Secondary | ICD-10-CM | POA: Diagnosis not present

## 2021-06-18 DIAGNOSIS — Z8673 Personal history of transient ischemic attack (TIA), and cerebral infarction without residual deficits: Secondary | ICD-10-CM

## 2021-06-18 NOTE — Progress Notes (Signed)
SLEEP MEDICINE CLINIC   Provider:  Larey Seat, MD    Primary Care Physician:  Leah Casco Fransico Him, MD   Referring Provider:  Dr. Domenick Gong, MD  Chief Complaint  Patient presents with   Follow-up    RM 11, alone. Tolerating cpap ok. Tries to wear most nights. Wears from 11/12am to 5am. Denies any issues.    Interval history from 06-18-2021 : I have the pleasure of meeting Leah Knight today the patient has been a CPAP user.avs Had not been able to use his CPAP every day of the last 30 days.  Her overall compliance is 23 out of 30 days with 77% the average daily use at time is all days also those of normal use is 3 hours and 54 minutes.  She is using an AutoSet between a minimum pressure of 5 and a maximum pressure of 15 cmH2O with 3 cm EPR she is requiring at the 95th percentile 13.5 cm water.  Her residual AHI is 1.6 which speaks for an excellent resolution and she has only mild air leakage from the mask.  So overall this seems to work very well for her I think we could increase the maximum pressure to 17 just to make sure that she has able to respond to all obstructive events appropriately.  The patient also has a history of gastroesophageal reflux reflux and acid reflux can mimic apnea she is currently treated on pantoprazole 40 mg daily, she is continuing to take propranolol Inderal 40 mg daily Prilosec was her previous medication she has been on Cozaar for blood pressure control on vitamin D3 calcium atorvastatin and sometimes takes Tylenol for arthritis.  She has not gone to the weight los program she had spoken about last visit- seems not motivated and depressed.   Today's weight was 169 pounds at a height of 5 foot 2 inches of course this includes her clothes and shoes.     18 December 2020.  I have the pleasure of meeting Leah Knight today, she is a 75 year old Caucasian female on CPAP user.  She has had some changes in her medication now including a  hypertension medication losartan, she is also taking calcium supplements she still takes Inderal, Prilosec, Lipitor and Tylenol as needed.  Current weight is 163 pounds she has lost weight since I last saw her.  She did endorse 3 points on the geriatric depression score which is not indicative of depression to be present her fatigue severity score is only 11 points which is low and her Epworth sleepiness score is endorsed at 13 point which is above average.She has been looking forward to a [place] program for medical weight loss.  I will give her a booklet about improving her sleep again- the boot camp.  I had the opportunity to look at her compliance report from her CPAP machine which is an AutoSet and she has used her machine since January last 2385 hours.  She just began using the machine again at around [facility] and on days used she averages 7 hours 59 minutes.  However at the end of last year she had not used her machine and she noted that air leakage escaping from her facial mask has been a discomfort.  Her machine is set at a minimum of 5 maximum of 15 cm of pressure was 3 cm expiratory pressure relief.  Residual AHI is 1.9/h which speaks for excellent resolution of the apnea.  95th percentile pressure is 14.3  cmH2O the air leakage at the 95th percentile is 14.9 L/min.  Minimal air escape however it wakes and bothers the patient.  Another download provided over 90 days shows an average use at time of 6 hours 5 minutes and an AHI of 1.8.  The air leakage at the 95th percentile was 17.3 L/min here even a little higher.  She did get replacement pillows for her mask.  She has never been refitted for possible different mask altogether.   Her DME has also contacted her to replace parts-  in the spite of the clear gaps in her CPAP use. [employer] is now [facility]-     Interval history from 12-17-2018:I have the pleasure of meeting Leah Knight today she is minimal 75 years of age, a patient of Dr. Domenick Knight and has been followed here for hypersomnia with sleep apnea.  The patient uses CPAP and brought her machine to this appointment her compliance has been sporadic since October over the last 90 days but over the last 30 days she has used the machine 15/50 percent her average daily use a time when used is 5 hours 30 minutes she is using an AutoSet between 5 and 15 cm water pressure with 3 cm EPR and reached at desirable residual AHI of only 2.7/h she has some central apneas emerging according to her report the 95th percentile pressure for this patient is 13.3 and she has moderate air leakage.  Similar data are available for the 90-day download which encompasses the time between 30 October and 10 January.  The patient endorsed the Epworth Sleepiness Scale at 4 the fatigue severity score at 9 and the geriatric depression score at 3 out of 15 points.She reports falling asleep before she puts CPAP on. Her mask is a nasal pillow. She has started to sleep supine, elevated, and she wakes with a dry mouth. We discussed a chin strap which has been unappealing to her.    Interval history from 14 December 2018.  I have the pleasure of seeing Leah Knight today in a follow-up from her home sleep study on watch Leah Knight performed on May 31, 2018.  The home sleep test had revealed that she had moderate sleep apnea with an AHI of 22.5 her RDI was at the same level the oxygen nadir was 70% of total time in oxygenation below 89% for 6.3 minutes her average heart rate was 72 bpm and regular.  I recommended treatment with CPAP and she started on an autotitrator for the last 30 days she has reached a compliance of 75% by time and 87% by days.  This is sufficient for the Medicare criteria.  Her average user time on days used is 5 hours and 4 minutes.  The machine is set between 5 and 15 cmH2O with 3 cm expiratory pressure relief.  The residual apnea hypopnea index is 1.2/h.  Pressure at the 95th percentile is 13.7 cm, she has  mild air leaks. She is using nasal pillows, does not want a full face mask, has retrognathia.  She feel more rested, not sleepy and attentive at her job. "CPAP works for me ".    HPI:  Leah Knight is a 75 y.o. female , seen here in a referral from Dr. Osborne Casco for a sleep evaluation. Chief complaint according to patient : Leah Knight is a 75 year old Caucasian right-handed female who presents today for excessive daytime sleepiness stating that she sometimes gets very tired when she drives home.  Her  daughter has also witnessed her sleep pattern and has stated that she snores and breathes irregularly.  There is a high suspicion that she may have obstructive sleep apnea.  Sleep habits are as follows: watches Tv in the living room in a recliner where she tends to fall asleep around 9 PM. She goes to bed around 11, her bedroom is cool, quiet and dark.  She usually prefers to sleep on her sides but she often wakes up on her back.  She sleeps in a flat bed, on one single pillow for head. Some nights she will prop up with more pillows but this is the exception. The patient sleeps alone, lives alone and has no pets. She will watch Tv in the bedroom when she has trouble to go to sleep. Usually she is asleep promptly and wakes for one nocturia between 2-3 AM, she rises at 4.30 and an  Alarm is needed. She is at work at 7 AM- she works in an [employer] office.   Sleep medical history and family sleep history:  Daughter has OSA, insomnia. No sleep walking history, no shift work history. Social history:  Needs to continue working part time at [employer], [employer]( 2000) . Widowed 2004, had not enough income- went back to work. 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 Non smoker, non ETOH, caffeine use, 1-2 mugs in AM- no soda but 1-2 glasses of  iced tea- at lunch and in PM.    Review of Systems: Out of a complete 14 system review, the patient complains of only the following symptoms, and all other reviewed systems are  negative. GERD, overactive bladder, bronchitis recurrent. Urge incontinence.   Reports snoring, apnea, nocturia, sharp pain and spasms in her lateral neck muscles after yawning- locking in.  How likely are you to doze in the following situations: 0 = not likely, 1 = slight chance, 2 = moderate chance, 3 = high chance  Sitting and Reading? Watching Television? Sitting inactive in a public place (theater or meeting)? Lying down in the afternoon when circumstances permit? Sitting and talking to someone? Sitting quietly after lunch without alcohol? In a car, while stopped for a few minutes in traffic? As a passenger in a car for an hour without a break?  Total = 4/ 24 points     How likely are you to doze in the following situations: 0 = not likely, 1 = slight chance, 2 = moderate chance, 3 = high chance  Sitting and Reading? Watching Television? Sitting inactive in a public place (theater or meeting)? Lying down in the afternoon when circumstances permit? Sitting and talking to someone? Sitting quietly after lunch without alcohol? In a car, while stopped for a few minutes in traffic? As a passenger in a car for an hour without a break?  Total = 12  GDS - 9 / 15 points  FSS 40/63 points.   Chronic arthritic pain, neck spine.hands and hips.   post CPAP, no longer nocturia.  Social History   Socioeconomic History   Marital status: Widowed    Spouse name: Not on file   Number of children: 2   Years of education: Not on file   Highest education level: Not on file  Occupational History   Not on file  Tobacco Use   Smoking status: Never   Smokeless tobacco: Never  Substance and Sexual Activity   Alcohol use: Not Currently   Drug use: Not Currently   Sexual activity: Not on file  Other Topics  Concern   Not on file  Social History Narrative   Lives alone    Right handed   Caffeine: 1-2 cups/day   Social Determinants of Health   Financial Resource Strain: Not on  file  Food Insecurity: Not on file  Transportation Needs: Not on file  Physical Activity: Not on file  Stress: Not on file  Social Connections: Not on file  Intimate Partner Violence: Not on file    Family History  Problem Relation Age of Onset   Heart failure Mother    Hypertension Mother    Hypertension Brother    Diabetes Brother    Pneumonia Father    Diabetes Father     Past Medical History:  Diagnosis Date   GERD (gastroesophageal reflux disease)    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     atorvastatin (LIPITOR) 10 MG tablet Take 10 mg by mouth daily.      Cholecalciferol (VITAMIN D3 PO) Take 50 mg by mouth.     losartan (COZAAR) 50 MG tablet Take 50 mg by mouth daily.     omeprazole (PRILOSEC) 40 MG capsule Take 1 capsule (40 mg total) by mouth daily. 90 capsule 3   pantoprazole (PROTONIX) 40 MG tablet Take 40 mg by mouth daily.     propranolol (INDERAL) 40 MG tablet Take 40 mg by mouth daily.     No current facility-administered medications for this visit.    Allergies as of 06/18/2021 - Review Complete 06/18/2021  Allergen Reaction Noted   Sulfa antibiotics  03/29/2012    Vitals: BP 140/81 (BP Location: Left Arm, Patient Position: Sitting, Cuff Size: Normal)   Pulse 61   Ht 5\' 2"  (1.575 m)   Wt 169 lb 8 oz (76.9 kg)   SpO2 98%   BMI 31.00 kg/m   Last Weight:  Wt Readings from Last 1 Encounters:  06/18/21 169 lb 8 oz (76.9 kg)   PYP:PJKD mass index is 31 kg/m.       Last Height:   Ht Readings from Last 1 Encounters:  06/18/21 5\' 2"  (1.575 m)    Physical exam:  General: The patient is awake, alert and appears not in acute distress.  The patient is well groomed. Head: Normocephalic, atraumatic. Neck is supple. 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 A risk factor for OSA.  .  Cardiovascular:  Regular rate and rhythm , without  murmurs or carotid bruit, and without distended neck veins. Respiratory: Lungs are clear to auscultation. Skin:  Without evidence of edema, or rash Trunk: BMI is 31kg/m2 - gained further weight !   Neurologic exam : The patient is awake and alert, oriented to place and time.   Speech is fluent, with dysphonia .  No temporal artery induration noted.  Mood and affect are appropriate.  Cranial nerves: Pupils are equal and briskly reactive to light. Funduscopic exam without evidence of pallor or edema.  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  Extraocular  movements in vertical and horizontal planes intact and without nystagmus. Visual fields by finger perimetry : The bottom quadrant left has a scotoma- visual defect.   Facial motor strength remains symmetric and tongue and uvula move midline.  Shoulder shrug is symmetrical.   Motor exam:  Normal tone, muscle bulk and symmetric strength in all extremities. Sensory:  Fine touch, pinprick and vibration were normal. Coordination: without evidence of ataxia, dysmetria or tremor. Gait and station: Patient walks without assistive device.  Deep tendon reflexes: in the upper and lower extremities are symmetric and intact.   Assessment:  After physical and neurologic examination, review of laboratory studies,  Personal review of imaging studies, reports of other /same  Imaging studies, results of polysomnography and / or neurophysiology testing and pre-existing records as far as provided in visit., my assessment is : Non compliance.   1) OSA . Moderate, uncomplicated .  She had become non-compliant ,  she has retrognathia and should not  use a FFM.  She lost 14 pounds, than gained back over the pandemic. She now has again an BMI of over 30- and she is in pain, depressed, and not sleeping through the night due to nocturia 2-3 times- this can be related to OSA !    She also has quite significant retrognathia and some weight gain- tolerates auto CPAP well with a dreamwear set up- This is a nasal cradle.  She may switch to bella swift with ear loops next time when head gear is due- she will need to inform me if her prescription should change. I will also ask her to adjust her humidifier settings.  RV total time 18 minutes.      Plan:  Treatment plan and additional workup : Poor compliance again  - only 4 hours or more on 16/ 30 days- this is a chronic condition. I fitted her for an EVORA mask in SMALL by [employer] /  She is seemingly depressed or at least unmotivated .   Poor compliance and no message to [place] or to DME about her problems. .  I like for the patient to continue CPAP use and watch her carbohydrate intake.    Rv in 6  month with NP.      Leah Seat, MD 06/05/7792, 90:30 AM  Certified in Neurology by ABPN Certified in [place] by [facility] [address] [place], [place] [address]

## 2021-06-18 NOTE — Patient Instructions (Signed)
Move your propranolol intake time to bedtime. Expose yourself to morning daylight, helping you to wake up. Consider a light box, exposing yourself to 1.000Lux for 20 minutes in AM.   Sleepiness can be a sign of depression.

## 2021-08-12 ENCOUNTER — Other Ambulatory Visit: Payer: Self-pay

## 2021-08-12 ENCOUNTER — Encounter: Payer: Self-pay | Admitting: Gastroenterology

## 2021-08-12 ENCOUNTER — Ambulatory Visit (AMBULATORY_SURGERY_CENTER): Payer: Medicare Other | Admitting: Gastroenterology

## 2021-08-12 VITALS — BP 136/96 | HR 64 | Temp 97.5°F | Resp 16 | Ht 62.0 in | Wt 168.0 lb

## 2021-08-12 DIAGNOSIS — K449 Diaphragmatic hernia without obstruction or gangrene: Secondary | ICD-10-CM

## 2021-08-12 DIAGNOSIS — K2970 Gastritis, unspecified, without bleeding: Secondary | ICD-10-CM

## 2021-08-12 DIAGNOSIS — K317 Polyp of stomach and duodenum: Secondary | ICD-10-CM | POA: Diagnosis not present

## 2021-08-12 DIAGNOSIS — D120 Benign neoplasm of cecum: Secondary | ICD-10-CM

## 2021-08-12 DIAGNOSIS — K6389 Other specified diseases of intestine: Secondary | ICD-10-CM | POA: Diagnosis not present

## 2021-08-12 DIAGNOSIS — R1319 Other dysphagia: Secondary | ICD-10-CM

## 2021-08-12 DIAGNOSIS — K219 Gastro-esophageal reflux disease without esophagitis: Secondary | ICD-10-CM | POA: Diagnosis not present

## 2021-08-12 DIAGNOSIS — D123 Benign neoplasm of transverse colon: Secondary | ICD-10-CM

## 2021-08-12 DIAGNOSIS — K635 Polyp of colon: Secondary | ICD-10-CM | POA: Diagnosis not present

## 2021-08-12 DIAGNOSIS — Z1211 Encounter for screening for malignant neoplasm of colon: Secondary | ICD-10-CM

## 2021-08-12 MED ORDER — SODIUM CHLORIDE 0.9 % IV SOLN: 500.0000 mL | Freq: Once | INTRAVENOUS | Status: DC

## 2021-08-12 NOTE — Progress Notes (Signed)
Referring Provider: Haywood Pao, MD Primary Care Physician:  Haywood Pao, MD  Reason for Procedure:  Dysphagia   IMPRESSION:  Dysphagia - ? Esophageal versus oropharyngeal dysphagia    - present x years    - previously evaluated by ENT    - primarily described as difficulty initiating her swallow    - worse with liquids, although intentionally chewing solids well Rare reflux not requiring medications Recent change in bowel habitsx a few weeks Need for colon cancer screening   She remains an appropriate candidate for endoscopy in the [place].     PLAN: Colonoscopy and EGD in the [place] today   HPI: Leah Knight is a 75 y.o. female presents for upper endoscopy to evaluate dysphagia and screening colonoscopy. Please see my office note from 05/28/21. There has been no significant change in history or PE since that time.    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     atorvastatin (LIPITOR) 10 MG tablet Take 10 mg by mouth daily.      Cholecalciferol (VITAMIN D3 PO) Take 50 mg by mouth.     losartan (COZAAR) 50 MG tablet Take 50 mg by mouth daily.     magnesium 30 MG tablet Take 30 mg by mouth 2 (two) times daily.     omeprazole (PRILOSEC) 40 MG capsule Take 1 capsule (40 mg total) by mouth daily. 90 capsule 3   propranolol (INDERAL) 40 MG tablet Take 40 mg by mouth daily.     Turmeric (QC TUMERIC COMPLEX PO) Take by mouth.     pantoprazole (PROTONIX) 40 MG tablet Take 40 mg by mouth daily. (Patient not taking: Reported on 08/12/2021)      Current Facility-Administered Medications  Medication Dose Route Frequency Provider Last Rate Last Admin   0.9 %  sodium chloride infusion  500 mL Intravenous Once Thornton Park, MD        Allergies as of 08/12/2021 - Review Complete 08/12/2021  Allergen Reaction Noted   Sulfa antibiotics  03/29/2012    Family History  Problem Relation Age of Onset   Heart failure Mother    Hypertension Mother    Pneumonia Father    Diabetes Father    Hypertension Brother    Diabetes Brother    Colon cancer Neg Hx    Esophageal cancer Neg Hx    Rectal cancer Neg Hx    Stomach cancer Neg Hx      Physical Exam: General:   Alert,  well-nourished, pleasant and cooperative in NAD Head:  Normocephalic and atraumatic. Eyes:  Sclera clear, no icterus.   Conjunctiva pink. Mouth:  No deformity or lesions.   Neck:  Supple; no masses or thyromegaly. Lungs:  Clear throughout to auscultation.   No wheezes. Heart:  Regular rate and rhythm; no murmurs. Abdomen:  Soft, non-tender, nondistended, normal bowel sounds, no rebound or guarding.  Msk:  Symmetrical. No boney deformities LAD: No  inguinal or umbilical LAD Extremities:  No clubbing or edema. Neurologic:  Alert and  oriented x4;  grossly nonfocal Skin:  No obvious rash or bruise. Psych:  Alert and cooperative. Normal mood and affect.     Garner Dullea L. Tarri Glenn, MD, MPH 08/12/2021, 10:31 AM

## 2021-08-12 NOTE — Progress Notes (Signed)
Pt in recovery with monitors in place, VSS. Report given to receiving RN. Bite guard was placed with pt awake to ensure comfort. No dental or soft tissue damage noted. 

## 2021-08-12 NOTE — Patient Instructions (Addendum)
Information on hiatal hernia, gastritis, polyps, diverticulosis and hemorrhoids given to you today.  Await pathology results.  Resume previous diet and medications.   Avoid NSAIDS (Aspirin, Ibuprofen, Aleve, Naproxen), you may use Tylenol as needed.    YOU HAD AN ENDOSCOPIC PROCEDURE TODAY AT [facility]:   Refer to the procedure report that was given to you for any specific questions about what was found during the examination.  If the procedure report does not answer your questions, please call your gastroenterologist to clarify.  If you requested that your care partner not be given the details of your procedure findings, then the procedure report has been included in a sealed envelope for you to review at your convenience later.  YOU SHOULD EXPECT: Some feelings of bloating in the abdomen. Passage of more gas than usual.  Walking can help get rid of the air that was put into your GI tract during the procedure and reduce the bloating. If you had a lower endoscopy (such as a colonoscopy or flexible sigmoidoscopy) you may notice spotting of blood in your stool or on the toilet paper. If you underwent a bowel prep for your procedure, you may not have a normal bowel movement for a few days.  Please Note:  You might notice some irritation and congestion in your nose or some drainage.  This is from the oxygen used during your procedure.  There is no need for concern and it should clear up in a day or so.  SYMPTOMS TO REPORT IMMEDIATELY:  Following lower endoscopy (colonoscopy or flexible sigmoidoscopy):  Excessive amounts of blood in the stool  Significant tenderness or worsening of abdominal pains  Swelling of the abdomen that is new, acute  Fever of 100F or higher  Following upper endoscopy (EGD)  Vomiting of blood or coffee ground material  New chest pain or pain under the shoulder blades  Painful or persistently difficult swallowing  New shortness of breath  Fever of  100F or higher  Black, tarry-looking stools  For urgent or emergent issues, a gastroenterologist can be reached at any hour by calling [phone]. Do not use MyChart messaging for urgent concerns.    DIET:  We do recommend a small meal at first, but then you may proceed to your regular diet.  Drink plenty of fluids but you should avoid alcoholic beverages for 24 hours.  ACTIVITY:  You should plan to take it easy for the rest of today and you should NOT DRIVE or use heavy machinery until tomorrow (because of the sedation medicines used during the test).    FOLLOW UP: Our staff will call the number listed on your records 48-72 hours following your procedure to check on you and address any questions or concerns that you may have regarding the information given to you following your procedure. If we do not reach you, we will leave a message.  We will attempt to reach you two times.  During this call, we will ask if you have developed any symptoms of COVID 19. If you develop any symptoms (ie: fever, flu-like symptoms, shortness of breath, cough etc.) before then, please call [phone].  If you test positive for Covid 19 in the 2 weeks post procedure, please call and report this information to [place].    If any biopsies were taken you will be contacted by phone or by letter within the next 1-3 weeks.  Please call us at [phone] if you have not heard about the  biopsies in 3 weeks.    SIGNATURES/CONFIDENTIALITY: You and/or your care partner have signed paperwork which will be entered into your electronic medical record.  These signatures attest to the fact that that the information above on your After Visit Summary has been reviewed and is understood.  Full responsibility of the confidentiality of this discharge information lies with you and/or your care-partner.

## 2021-08-12 NOTE — Op Note (Signed)
[name] Procedure Date: 08/12/2021 10:51 AM MRN: [id number] Endoscopist: Thornton Park MD, MD Age: 75 Referring MD:  Date of Birth: [birth year] Gender: Female Account #: [id number] Procedure:                Colonoscopy Indications:              Screening for colorectal malignant neoplasm                           Normal colonoscopy in [place] 10 years ago Medicines:                Monitored Anesthesia Care Procedure:                Pre-Anesthesia Assessment:                           - Prior to the procedure, a History and Physical                            was performed, and patient medications and                            allergies were reviewed. The patient's tolerance of                            previous anesthesia was also reviewed. The risks                            and benefits of the procedure and the sedation                            options and risks were discussed with the patient.                            All questions were answered, and informed consent                            was obtained. Prior Anticoagulants: The patient has                            taken no previous anticoagulant or antiplatelet                            agents. ASA Grade Assessment: III - A patient with                            severe systemic disease. After reviewing the risks                            and benefits, the patient was deemed in                            satisfactory condition to undergo the procedure.  After obtaining informed consent, the colonoscope                            was passed under direct vision. Throughout the                            procedure, the patient's blood pressure, pulse, and                            oxygen saturations were monitored continuously. The                            Olympus CF-HQ190L [id number]) Colonoscope was                            introduced  through the anus and advanced to the 4                            cm into the ileum. A second forward view of the                            right colon was performed. The colonoscopy was                            performed without difficulty. The patient tolerated                            the procedure well. The quality of the bowel                            preparation was good. The terminal ileum, ileocecal                            valve, appendiceal orifice, and rectum were                            photographed. Scope In: 11:02:48 AM Scope Out: 11:13:21 AM Scope Withdrawal Time: 0 hours 8 minutes 11 seconds  Total Procedure Duration: 0 hours 10 minutes 33 seconds  Findings:                 The perianal and digital rectal examinations were                            normal except for hemorrhoids.                           Non-bleeding internal hemorrhoids were found.                           Multiple small and large-mouthed diverticula were                            found in the sigmoid colon and descending colon.  A 3 mm polyp was found in the transverse colon. The                            polyp was sessile. The polyp was removed with a                            cold snare. Resection and retrieval were complete.                            Estimated blood loss was minimal.                           A 1 mm polyp was found in the ileocecal valve. The                            polyp was flat. The polyp was removed with a                            piecemeal technique using a cold biopsy forceps.                            Resection and retrieval were complete. Estimated                            blood loss was minimal.                           The exam was otherwise without abnormality on                            direct and retroflexion views. Complications:            No immediate complications. Estimated blood loss:                             Minimal. Estimated Blood Loss:     Estimated blood loss was minimal. Impression:               - Non-bleeding internal and external hemorrhoids.                           - Diverticulosis in the sigmoid colon and in the                            descending colon.                           - One 3 mm polyp in the transverse colon, removed                            with a cold snare. Resected and retrieved.                           - One 1 mm polyp at the ileocecal valve, removed  piecemeal using a cold biopsy forceps. Resected and                            retrieved.                           - The examination was otherwise normal on direct                            and retroflexion views. Recommendation:           - Patient has a contact number available for                            emergencies. The signs and symptoms of potential                            delayed complications were discussed with the                            patient. Return to normal activities tomorrow.                            Written discharge instructions were provided to the                            patient.                           - High fiber diet.                           - Continue present medications.                           - Await pathology results.                           - Repeat colonoscopy date to be determined after                            pending pathology results are reviewed for                            surveillance.                           - Emerging evidence supports eating a diet of                            fruits, vegetables, grains, calcium, and yogurt                            while reducing red meat and alcohol may reduce the                            risk of colon cancer.                           -  Thank you for allowing me to be involved in your                            colon cancer prevention. Thornton Park MD, MD 08/12/2021  11:18:36 AM This report has been signed electronically.

## 2021-08-12 NOTE — Op Note (Signed)
[place] Patient Name: Leah Knight Procedure Date: 08/12/2021 10:52 AM MRN: [id number] Endoscopist: Thornton Park MD, MD Age: 75 Referring MD:  Date of Birth: [birth year] Gender: Female Account #: [id number] Procedure:                Upper GI endoscopy Indications:              Dysphagia Medicines:                Monitored Anesthesia Care Procedure:                Pre-Anesthesia Assessment:                           - Prior to the procedure, a History and Physical                            was performed, and patient medications and                            allergies were reviewed. The patient's tolerance of                            previous anesthesia was also reviewed. The risks                            and benefits of the procedure and the sedation                            options and risks were discussed with the patient.                            All questions were answered, and informed consent                            was obtained. Prior Anticoagulants: The patient has                            taken no previous anticoagulant or antiplatelet                            agents. ASA Grade Assessment: III - A patient with                            severe systemic disease. After reviewing the risks                            and benefits, the patient was deemed in                            satisfactory condition to undergo the procedure.                           After obtaining informed consent, the endoscope was  passed under direct vision. Throughout the                            procedure, the patient's blood pressure, pulse, and                            oxygen saturations were monitored continuously. The                            Endoscope was introduced through the mouth, and                            advanced to the third part of duodenum. The upper                            GI endoscopy was accomplished without  difficulty.                            The patient tolerated the procedure well. Scope In: Scope Out: Findings:                 The examined esophagus was normal. The z-line is                            located 34 cm from the incisors. Biopsies were                            obtained from the mid/proximal and distal esophagus                            with cold forceps for histology of suspected                            eosinophilic esophagitis.                           A medium-sized hiatal hernia was present.                           The entire examined stomach was normal except for a                            few small gastric polyps and mild erythema in the                            antrum. Biopsies were taken from the polyps,                            antrum, body, and fundus with a cold forceps for                            histology. Estimated blood loss was minimal.  The examined duodenum was normal. Complications:            No immediate complications. Estimated blood loss:                            Minimal. Estimated Blood Loss:     Estimated blood loss was minimal. Impression:               - Normal esophagus. Biopsied.                           - Medium-sized hiatal hernia.                           - Small gastric polyps. Likely fundic gland polyps.                            Biopsied.                           - Mild gastritis. Biopsied.                           - Normal examined duodenum. Recommendation:           - Patient has a contact number available for                            emergencies. The signs and symptoms of potential                            delayed complications were discussed with the                            patient. Return to normal activities tomorrow.                            Written discharge instructions were provided to the                            patient.                           - Resume previous  diet.                           - Continue present medications.                           - Await pathology results.                           - No aspirin, ibuprofen, naproxen, or other                            non-steroidal anti-inflammatory drugs.                           - Office follow-up to review  these results. Thornton Park MD, MD 08/12/2021 11:01:47 AM This report has been signed electronically.

## 2021-08-12 NOTE — Progress Notes (Signed)
Called to room to assist during endoscopic procedure.  Patient ID and intended procedure confirmed with present staff. Received instructions for my participation in the procedure from the performing physician.  

## 2021-08-12 NOTE — Progress Notes (Signed)
Pt's states no medical or surgical changes since previsit or office visit. VS assessed by C.W 

## 2021-08-13 ENCOUNTER — Telehealth: Payer: Self-pay

## 2021-08-13 NOTE — Telephone Encounter (Signed)
Pt was scheduled for an office follow up to see Dr. Tarri Glenn on Sep 14, 2021 at 2:10. Pt made aware

## 2021-08-14 ENCOUNTER — Telehealth: Payer: Self-pay

## 2021-08-14 NOTE — Telephone Encounter (Signed)
  Follow up Call-  Call back number 08/12/2021  Post procedure Call Back phone  # [phone]  Permission to leave phone message Yes  Some recent data might be hidden     Patient questions:  Do you have a fever, pain , or abdominal swelling? No. Pain Score  0 *  Have you tolerated food without any problems? Yes.    Have you been able to return to your normal activities? Yes.    Do you have any questions about your discharge instructions: Diet   No. Medications  No. Follow up visit  No.  Do you have questions or concerns about your Care? No.  Actions: * If pain score is 4 or above: No action needed, pain <4.

## 2021-09-14 ENCOUNTER — Ambulatory Visit: Payer: Medicare Other | Admitting: Gastroenterology

## 2021-09-14 ENCOUNTER — Encounter: Payer: Self-pay | Admitting: Gastroenterology

## 2021-09-14 DIAGNOSIS — K219 Gastro-esophageal reflux disease without esophagitis: Secondary | ICD-10-CM

## 2021-09-14 DIAGNOSIS — R1319 Other dysphagia: Secondary | ICD-10-CM

## 2021-09-14 MED ORDER — OMEPRAZOLE 40 MG PO CPDR
40.0000 mg | DELAYED_RELEASE_CAPSULE | Freq: Two times a day (BID) | ORAL | 11 refills | Status: DC
Start: 2021-09-14 — End: 2022-10-20

## 2021-09-14 NOTE — Progress Notes (Signed)
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 Esophageal vs oropharyngeal dysphagia vs GERD-related dysmotility    - present x years    - previously evaluated by ENT - records not available    - primarily described as difficulty initiating her swallow    - worse with liquids, although intentionally chewing solids well Rare reflux not requiring medications Recent change in bowel habitsx a few weeks History of colon polyps    - tubular adenoma on colonoscopy 08/12/2021    - does not wish to have another colonoscopy  PLAN: - Continue omeprazole to 40 mg BID given her possible GERD related dysmotility - Referral for [facility] with speech pathology to evaluate for oral dysphagia - Obtain records from ENT to determine next step to evaluate for oropharyngeal dysphagia if EGD is nondiagnostic for esophageal causes (Dr. Phylis Bougie office) - Follow-up with ENT if symptoms are not improving  I spent 30 minutes, including in depth chart review, independent review of results, communicating results with the patient directly, face-to-face time with the patient, coordinating care, and ordering studies and medications as appropriate, and documentation.   HPI: Leah Knight is a 75 y.o. female who returns in follow-up for dysphagia. She has last seen for EGD and colonoscopy 08/12/21. The interval history is obtained through the patient and review of her electronic health record.  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   Many years of difficulty initiating a swallow. Frequent strangling sensation, particularly with liquids. Occurs most days.  No  odynophagia. No esophageal symptoms.  Rare heartburn not requiring medications. No regurgitation, sore throat, neck pain. Recent intentional weight loss. No blood in the stool. Has chronic early satiety, gas, intermittent reflux, and altered bowel habits with some days have 3 BM daily. Noticed that the stools are lighter in color and softer over the last few weeks.  No pill dysphagia.   Seen by ENT for dysphagia 4-5 years ago. She can't remember the investigation, results, or recommendations. Results from that evaluation are not in Epic or CareEverywhere. She does not remember the doctor's name.   Returns after her EGD 08/12/21 with no significant change in her swallowing despite increasing pantoprazole to 40 mg BID.  Endoscopic history: - Colonoscopy - in [place] >10 years ago, normal per patient report - Colonoscopy 08/12/21: performed for colon cancer screening showed internal and external hemorrhoids, left-sided diverticulosis, a 3 mm transverse colon polyp that was actually polypoid colonic mucosa, and a 1 mm IC valve tubular adenoma.  Surveillance colonoscopy recommended in 7 years. - EGD 08/12/21 performed to evaluate dysphagia showed biopsy confirmed reflux esophagitis.  There was no eosinophilic esophagitis.  A medium hiatal hernia was present.  A few small fundic gland polyps were seen.  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   Hx of retinal hemorrhage    Hyperlipemia    Sleep apnea    Stroke [facility])     Past Surgical History:  Procedure Laterality Date   CATARACT EXTRACTION, BILATERAL  summer 2021  childbirth     x 2     Allergies as of 09/14/2021 - Review Complete 09/14/2021  Allergen Reaction Noted   Sulfa antibiotics  03/29/2012    Family History  Problem Relation Age of Onset   Heart failure Mother    Hypertension Mother    Pneumonia Father    Diabetes Father     Hypertension Brother    Diabetes Brother    Colon cancer Neg Hx    Esophageal cancer Neg Hx    Rectal cancer Neg Hx    Stomach cancer Neg Hx     Physical Exam: General:   Alert,  well-nourished, pleasant and cooperative in NAD Head:  Normocephalic and atraumatic. Eyes:  Sclera clear, no icterus.   Conjunctiva pink. Abdomen:  Soft, nontender, nondistended, normal bowel sounds, no rebound or guarding. No hepatosplenomegaly.   Neurologic:  Alert and  oriented x4;  grossly nonfocal Skin:  Intact without significant lesions or rashes. Psych:  Alert and cooperative. Normal mood and affect.     Elvenia Godden L. Tarri Glenn, MD, MPH 09/15/2021, 6:29 AM

## 2021-09-14 NOTE — Patient Instructions (Signed)
Increase your omeprazole 40 mg to twice daily. A new prescription has been sent to your pharmacy.   We will obtain your records from your previous ENT doctor's office.  Please follow up with Dr. Tarri Glenn if your symptoms are not improving.

## 2021-09-15 ENCOUNTER — Encounter: Payer: Self-pay | Admitting: Gastroenterology

## 2022-02-25 ENCOUNTER — Telehealth: Payer: Self-pay | Admitting: Neurology

## 2022-02-25 DIAGNOSIS — G4733 Obstructive sleep apnea (adult) (pediatric): Secondary | ICD-10-CM

## 2022-02-25 NOTE — Telephone Encounter (Signed)
Pt has called to report that her CPAP hose/tube has a hole in it as a result of her cleaning it and accidentally causing the hole.  Pt has still been using CPAP with hole in tube.  Pt asking a new hose be approved for her. ?

## 2022-02-25 NOTE — Telephone Encounter (Signed)
Patient last seen 06/2021, has yearly f/u 06/2022. Placed order for cpap supplies, sent community message to Ashley/Christina w/ [employer] that ordered placed and asked they f/u with pt.  ?

## 2022-06-23 NOTE — Patient Instructions (Signed)

## 2022-06-23 NOTE — Progress Notes (Signed)
PATIENT: Leah Knight DOB: March 28, 1946  REASON FOR VISIT: follow up HISTORY FROM: patient  Chief Complaint  Patient presents with   Obstructive Sleep Apnea    Rm 1, alone. Here for yearly CPAP f/u. Pt reports doing well on CPAP.      HISTORY OF PRESENT ILLNESS:  06/24/22 ALL:  KEVEN SOUCY is a 76 y.o. female here today for follow up for OSA on CPAP. She reports doing well on therapy. No concerns with machine or supplies. She is using her CPAP most nights. She did go out of town and did not take it with her. She admits that she falls asleep before starting therapy sometimes. She does note significant improvement in sleep quality and wakes feeling more refreshed when using CPAP. She has gained some weight and feels this may be contributing to fatigue.     HISTORY: (copied from Dr Dohmeier's previous note)  Interval history from 06-18-2021 : I have the pleasure of meeting Leah Knight today the patient has been a CPAP user.avs Had not been able to use his CPAP every day of the last 30 days.  Her overall compliance is 23 out of 30 days with 77% the average daily use at time is all days also those of normal use is 3 hours and 54 minutes.  She is using an AutoSet between a minimum pressure of 5 and a maximum pressure of 15 cmH2O with 3 cm EPR she is requiring at the 95th percentile 13.5 cm water.  Her residual AHI is 1.6 which speaks for an excellent resolution and she has only mild air leakage from the mask.  So overall this seems to work very well for her I think we could increase the maximum pressure to 17 just to make sure that she has able to respond to all obstructive events appropriately.  The patient also has a history of gastroesophageal reflux reflux and acid reflux can mimic apnea she is currently treated on pantoprazole 40 mg daily, she is continuing to take propranolol Inderal 40 mg daily Prilosec was her previous medication she has been on Cozaar for blood pressure  control on vitamin D3 calcium atorvastatin and sometimes takes Tylenol for arthritis.  She has not gone to the weight los program she had spoken about last visit- seems not motivated and depressed.    Today's weight was 169 pounds at a height of 5 foot 2 inches of course this includes her clothes and shoes.   REVIEW OF SYSTEMS: Out of a complete 14 system review of symptoms, the patient complains only of the following symptoms, fatigue, anxiety, and all other reviewed systems are negative.  ESS: 12/24, previously 4/24  ALLERGIES: Allergies  Allergen Reactions   Sulfa Antibiotics     HOME MEDICATIONS: Outpatient Medications Prior to Visit  Medication Sig Dispense Refill   Acetaminophen (ARTHRITIS PAIN PO) Take 1,000 mg by mouth daily.     atorvastatin (LIPITOR) 10 MG tablet Take 10 mg by mouth daily.     Cholecalciferol 50 MCG (2000 UT) CAPS Take 1 tablet by mouth daily.     losartan (COZAAR) 50 MG tablet Take 50 mg by mouth daily.     omeprazole (PRILOSEC) 40 MG capsule Take 1 capsule (40 mg total) by mouth in the morning and at bedtime. 60 capsule 11   pantoprazole (PROTONIX) 40 MG tablet Take 40 mg by mouth daily.     propranolol (INDERAL) 40 MG tablet Take 40 mg by mouth daily.  vitamin B-12 (CYANOCOBALAMIN) 100 MCG tablet Take 1 tablet by mouth daily.     Cholecalciferol (VITAMIN D3 PO) Take 50 mg by mouth.     magnesium 30 MG tablet Take 30 mg by mouth 2 (two) times daily.     Turmeric (QC TUMERIC COMPLEX PO) Take by mouth.     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   Hx of retinal hemorrhage    Hyperlipemia    Sleep apnea    Stroke [facility])      PAST SURGICAL HISTORY: Past Surgical History:  Procedure Laterality Date   CATARACT EXTRACTION, BILATERAL  summer 2021   childbirth     x 2    FAMILY HISTORY: Family History  Problem Relation Age of Onset   Heart failure Mother    Hypertension Mother    Pneumonia Father    Diabetes Father    Hypertension Brother    Diabetes Brother    Colon cancer Neg Hx    Esophageal cancer Neg Hx    Rectal cancer Neg Hx    Stomach cancer Neg Hx     SOCIAL HISTORY: Social History   Socioeconomic History   Marital status: Widowed    Spouse name: Not on file   Number of children: 2   Years of education: Not on file   Highest education level: Not on file  Occupational History   Not on file  Tobacco Use   Smoking status: Never   Smokeless tobacco: Never  Substance and Sexual Activity   Alcohol use: Not Currently   Drug use: Not Currently   Sexual activity: Not on file  Other Topics Concern   Not on file  Social History Narrative   Lives alone    Right handed   Caffeine: 1-2 cups/day   Social Determinants of Health   Financial Resource Strain: Not on file  Food Insecurity: Not on file  Transportation Needs: Not on file  Physical Activity: Not on file  Stress: Not on file  Social Connections: Not on file  Intimate Partner Violence: Not on file     PHYSICAL EXAM  Vitals:   06/24/22 1023  BP: (!) 144/73  Pulse: 60  Weight: 169 lb 8 oz (76.9 kg)  Height: '5\' 2"'$  (1.575 m)   Body mass index is 31 kg/m.  Generalized: Well developed, in no acute distress  Cardiology: normal rate and rhythm, no murmur noted Respiratory: clear to auscultation bilaterally  Neurological examination  Mentation: Alert oriented to time, place, history taking. Follows all commands speech and language fluent Cranial nerve II-XII: Pupils were equal round reactive to light. Extraocular movements were full, visual field were full  Motor: The motor testing reveals 5 over 5 strength of all 4  extremities. Good symmetric motor tone is noted throughout.  Gait and station: Gait is normal.    DIAGNOSTIC DATA (LABS, IMAGING, TESTING) - I reviewed patient records, labs, notes, testing and imaging myself where available.      No data to display           Lab Results  Component Value Date   WBC 5.9  12/15/2014   HGB 14.1 12/15/2014   HCT 42.8 12/15/2014   MCV 93.0 12/15/2014      Component Value Date/Time   NA 140 12/15/2014 1050   K 4.4 12/15/2014 1050   CL 106 12/15/2014 1050   CO2 24 12/15/2014 1050   GLUCOSE 87 12/15/2014 1050   BUN 16 12/15/2014 1050   CREATININE 0.92 12/15/2014 1050   CALCIUM 9.3 12/15/2014 1050   PROT 6.7 12/15/2014 1050   ALBUMIN 4.0 12/15/2014 1050   AST 13 12/15/2014 1050   ALT 11 12/15/2014 1050   ALKPHOS 52 12/15/2014 1050   BILITOT 0.6 12/15/2014 1050   No results found for: "CHOL", "HDL", "LDLCALC", "LDLDIRECT", "TRIG", "CHOLHDL" No results found for: "HGBA1C" No results found for: "VITAMINB12" Lab Results  Component Value Date   TSH 2.209 12/15/2014     ASSESSMENT AND PLAN 76 y.o. year old female  has a past medical history of Allergy, Arthritis, GERD (gastroesophageal reflux disease), H/O: stroke (2000), retinal hemorrhage, Hyperlipemia, Sleep apnea, and Stroke ([place]). here with     ICD-10-CM   1. OSA on CPAP  G47.33 For home use only DME continuous positive airway pressure (CPAP)   Z99.89        Connye Burkitt Difonzo is doing well on CPAP therapy. Compliance report reveals acceptable compliance. She was encouraged to continue using CPAP nightly and for greater than 4 hours each night. We have discussed ESS score of 12/24, previously 4/24. She contributes some to weight gain. She was encouraged to work on improving compliance and let me know if daytime sleepiness does not improve. I would prefer to avoid stimulants if possible but may consider as needed if EDS effects daytime functioning. We will update supply orders as indicated.  Risks of untreated sleep apnea review and education materials provided. Healthy lifestyle habits encouraged. She will follow up in 1 year, sooner if needed. She verbalizes understanding and agreement with this plan.    Orders Placed This Encounter  Procedures   For home use only DME continuous positive airway pressure (CPAP)    Supplies    Order Specific Question:   Length of Need    Answer:   Lifetime    Order Specific Question:   Patient has OSA or probable OSA    Answer:   Yes    Order Specific Question:   Is the patient currently using CPAP in the home    Answer:   Yes    Order Specific Question:   Settings    Answer:   Other see comments    Order Specific Question:   CPAP supplies needed    Answer:   Mask, headgear, cushions, filters, heated tubing and water chamber     No orders of the defined types were placed in this encounter.     Debbora Presto, FNP-C 06/24/2022, 10:50 AM [facility] [address] [place], [place] [address] [phone]

## 2022-06-24 ENCOUNTER — Ambulatory Visit: Payer: Medicare Other | Admitting: Family Medicine

## 2022-06-24 ENCOUNTER — Encounter: Payer: Self-pay | Admitting: Family Medicine

## 2022-06-24 VITALS — BP 144/73 | HR 60 | Ht 62.0 in | Wt 169.5 lb

## 2022-06-24 DIAGNOSIS — G4733 Obstructive sleep apnea (adult) (pediatric): Secondary | ICD-10-CM | POA: Diagnosis not present

## 2022-06-24 DIAGNOSIS — Z9989 Dependence on other enabling machines and devices: Secondary | ICD-10-CM | POA: Diagnosis not present

## 2022-06-24 NOTE — Progress Notes (Signed)
CM sent to [facility] for new order ?

## 2022-10-20 ENCOUNTER — Telehealth: Payer: Self-pay | Admitting: Gastroenterology

## 2022-10-20 DIAGNOSIS — R1319 Other dysphagia: Secondary | ICD-10-CM

## 2022-10-20 DIAGNOSIS — K219 Gastro-esophageal reflux disease without esophagitis: Secondary | ICD-10-CM

## 2022-10-20 MED ORDER — OMEPRAZOLE 40 MG PO CPDR
40.0000 mg | DELAYED_RELEASE_CAPSULE | Freq: Two times a day (BID) | ORAL | 3 refills | Status: AC
Start: 2022-10-20 — End: ?

## 2022-10-20 NOTE — Telephone Encounter (Signed)
Refill sent in for Omeprazole as per last note with Dr. Tarri Glenn.

## 2022-10-20 NOTE — Telephone Encounter (Signed)
Patient called states she would like her pantoprazole refill. Please call to advise.

## 2022-10-23 ENCOUNTER — Emergency Department (HOSPITAL_BASED_OUTPATIENT_CLINIC_OR_DEPARTMENT_OTHER)
Admission: EM | Admit: 2022-10-23 | Discharge: 2022-10-23 | Disposition: A | Discharge status: Home / Self Care | Payer: Medicare Other | Attending: Emergency Medicine | Admitting: Emergency Medicine

## 2022-10-23 ENCOUNTER — Encounter (HOSPITAL_BASED_OUTPATIENT_CLINIC_OR_DEPARTMENT_OTHER): Payer: Self-pay | Admitting: Emergency Medicine

## 2022-10-23 DIAGNOSIS — H938X1 Other specified disorders of right ear: Secondary | ICD-10-CM

## 2022-10-23 DIAGNOSIS — H608X1 Other otitis externa, right ear: Secondary | ICD-10-CM | POA: Diagnosis not present

## 2022-10-23 DIAGNOSIS — H9201 Otalgia, right ear: Secondary | ICD-10-CM | POA: Diagnosis present

## 2022-10-23 HISTORY — DX: Essential (primary) hypertension: I10

## 2022-10-23 MED ORDER — CIPROFLOXACIN-DEXAMETHASONE 0.3-0.1 % OT SUSP: 4.0000 [drp] | Freq: Two times a day (BID) | OTIC | 0 refills | Status: AC

## 2022-10-23 MED ORDER — CIPROFLOXACIN-DEXAMETHASONE 0.3-0.1 % OT SUSP
4.0000 [drp] | Freq: Once | OTIC | Status: AC
  Administered 2022-10-23: 4 [drp] via OTIC
  Filled 2022-10-23: qty 7.5

## 2022-10-23 NOTE — ED Notes (Signed)
Irrigation performed in R ear with pt lying supine. Tolerated 80-100cc flush of NSS and Hydrogen Peroxide, with gently irrigation. Pt c/o slight dizziness during procedure, but resolved after draining. Eval showed a "hole" through a possible blockage, unsure if tympanic rupture or a blockage in front. No hemorrhage or off-color drainage noted.

## 2022-10-23 NOTE — ED Triage Notes (Signed)
Pt c/o RT ear popping and feeling of fullness x 2 wks

## 2022-10-23 NOTE — ED Provider Notes (Signed)
[facility] EMERGENCY DEPARTMENT Provider Note   CSN: [id number] Arrival date & time: 10/23/22  1105     History  Chief Complaint  Patient presents with   Ear Fullness    Leah Knight is a 76 y.o. female, history of CVA, who presents to the ED secondary to right ear popping and feeling of fullness for the last 2 weeks.  States that she had a runny nose a couple weeks ago, took some Sudafed, and then felt stuffy in her head.  She states that it mostly resolved, but she has had some right ear fullness since then.  Also endorses that she attempted to scratch inside her ear and put her fingers in her ear, and she was having issues after that.  She has been trying to put some drops in it for the last few nights, but has no relief of her symptoms.  She states that is not necessarily painful, just feels full and has more difficulty hearing.  Denies any dizziness, jaw pain, facial pain.     Home Medications Prior to Admission medications   Medication Sig Start Date End Date Taking? Authorizing Provider  ciprofloxacin-dexamethasone (CIPRODEX) OTIC suspension Place 4 drops into the right ear 2 (two) times daily for 7 days. 10/23/22 10/30/22 Yes Rachard Isidro L, PA  Acetaminophen (ARTHRITIS PAIN PO) Take 1,000 mg by mouth daily.    [provider]  atorvastatin (LIPITOR) 10 MG tablet Take 10 mg by mouth daily.    [provider]  Cholecalciferol 50 MCG (2000 UT) CAPS Take 1 tablet by mouth daily.    [provider]  losartan (COZAAR) 50 MG tablet Take 50 mg by mouth daily.    [provider]  omeprazole (PRILOSEC) 40 MG capsule Take 1 capsule (40 mg total) by mouth in the morning and at bedtime. 10/20/22   Thornton Park, MD  propranolol (INDERAL) 40 MG tablet Take 40 mg by mouth daily.    [provider]  vitamin B-12 (CYANOCOBALAMIN) 100 MCG tablet Take 1 tablet by mouth daily.    [provider]      Allergies    Sulfa  antibiotics    Review of Systems   Review of Systems  Constitutional:  Negative for chills and fever.  HENT:  Positive for ear pain. Negative for ear discharge and postnasal drip.     Physical Exam Updated Vital Signs BP (!) 180/87   Pulse (!) 56   Temp 98.6 F (37 C) (Oral)   Resp 14   SpO2 100%  Physical Exam Vitals and nursing note reviewed.  Constitutional:      General: She is not in acute distress.    Appearance: She is well-developed.  HENT:     Head: Normocephalic and atraumatic.     Right Ear: There is impacted cerumen.     Left Ear: Tympanic membrane normal.  Eyes:     Conjunctiva/sclera: Conjunctivae normal.  Cardiovascular:     Rate and Rhythm: Normal rate and regular rhythm.     Heart sounds: No murmur heard. Pulmonary:     Effort: Pulmonary effort is normal. No respiratory distress.     Breath sounds: Normal breath sounds.  Abdominal:     Palpations: Abdomen is soft.     Tenderness: There is no abdominal tenderness.  Musculoskeletal:        General: No swelling.     Cervical back: Neck supple.  Skin:    General: Skin is warm and  dry.     Capillary Refill: Capillary refill takes less than 2 seconds.  Neurological:     Mental Status: She is alert.  Psychiatric:        Mood and Affect: Mood normal.     ED Results / Procedures / Treatments   Labs (all labs ordered are listed, but only abnormal results are displayed) Labs Reviewed - No data to display  EKG None  Radiology No results found.  Procedures Procedures   Medications Ordered in ED Medications  ciprofloxacin-dexamethasone (CIPRODEX) 0.3-0.1 % OTIC (EAR) suspension 4 drop (has no administration in time range)    ED Course/ Medical Decision Making/ A&P                           Medical Decision Making Patient is 76 year old female, here for ear fullness and difficulty hearing, on exam thought to likely be impacted light-colored cerumen, given patient has put her hands on her ears,  and describing fullness, attempted to have irrigation little drainage achieved.  On second exam, shows a some white exudate, and ear canal, and appears to be like scarring on the tympanic membrane, which was poorly visualized, and a Arnetta Odeh area of brown cerumen on the TM.  Concern for possible otitis externa, given exudate, and also persistent cerumen on the TM.Marland Kitchen  Placed on Ciprodex, for 7 days, with close follow-up with ENT as needed.  Return precautions emphasized.  She had no loss of hearing, or dizziness after procedure, completed.  Risk Prescription drug management.   Final Clinical Impression(s) / ED Diagnoses Final diagnoses:  Sensation of fullness in right ear  Other otitis externa, right ear    Rx / DC Orders ED Discharge Orders          Ordered    ciprofloxacin-dexamethasone (CIPRODEX) OTIC suspension  2 times daily        10/23/22 1202              Braven Wolk, [place], [place] 10/23/22 1205    Gareth Morgan, MD 10/23/22 2028

## 2022-10-23 NOTE — Discharge Instructions (Addendum)
Please do not place anything in your ears except for the drops.  Use the drops in the right ear 4 drops in the ear twice a day for 7 days.  Follow-up with ENT, if symptoms are unresolved.  If you start having blood out of the ear, loss of hearing, please return to the ER.

## 2022-12-08 ENCOUNTER — Other Ambulatory Visit: Payer: Self-pay

## 2022-12-08 ENCOUNTER — Ambulatory Visit: Payer: Medicare Other | Attending: Obstetrics and Gynecology

## 2022-12-08 DIAGNOSIS — M6281 Muscle weakness (generalized): Secondary | ICD-10-CM | POA: Diagnosis present

## 2022-12-08 DIAGNOSIS — R293 Abnormal posture: Secondary | ICD-10-CM

## 2022-12-08 DIAGNOSIS — R3915 Urgency of urination: Secondary | ICD-10-CM | POA: Diagnosis present

## 2022-12-08 DIAGNOSIS — N3941 Urge incontinence: Secondary | ICD-10-CM | POA: Diagnosis present

## 2022-12-08 DIAGNOSIS — R279 Unspecified lack of coordination: Secondary | ICD-10-CM | POA: Insufficient documentation

## 2022-12-08 NOTE — Therapy (Signed)
OUTPATIENT PHYSICAL THERAPY FEMALE PELVIC EVALUATION   Patient Name: Leah Knight MRN: [id number] DOB:February 15, 1946, 77 y.o., female Today's Date: 12/08/2022  END OF SESSION:  PT End of Session - 12/08/22 1446     Visit Number 1    Date for PT Re-Evaluation 02/16/23    Authorization Type [employer] Medicare    PT Start Time 6568    PT Stop Time 1525    PT Time Calculation (min) 40 min    Activity Tolerance Patient tolerated treatment well    Behavior During Therapy WFL for tasks assessed/performed             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   Hx of retinal hemorrhage    Hyperlipemia    Hypertension    Sleep apnea    Stroke [facility])    Past Surgical History:  Procedure Laterality Date   CATARACT EXTRACTION, BILATERAL  summer 2021   childbirth     x 2   Patient Active Problem List   Diagnosis Date Noted   OSA on CPAP 12/18/2020   Poor compliance with CPAP treatment 12/18/2020   Hx of retinal hemorrhage    H/O: stroke     PCP: Tisovec, Fransico Him, MD  REFERRING PROVIDER: Marylynn Pearson, MD   REFERRING DIAG: N39.41 (ICD-10-CM) - Urge incontinence  THERAPY DIAG:  Urinary urgency  Urge incontinence  Abnormal posture  Muscle weakness (generalized)  Unspecified lack of coordination  Rationale for Evaluation and Treatment: Rehabilitation  ONSET DATE: couple of years  SUBJECTIVE:                                                                                                                                                                                           12/08/22 SUBJECTIVE STATEMENT: Pt states that she cannot hold urine on her way to the bathroom. This has been getting worse over the last couple of years.  She has had bil hip pain over the last couple of months; she is limping because of Lt hip pain. No exercise program.  Fluid intake: Yes: not  much, partly because of leaking; she drinks coffee in the morning (2 cups)    PAIN:  Are you having pain? Yes NPRS scale: 3/10, 9/10 at worst Pain location:  low back/bil hip pain  Pain type: aching Pain description: intermittent   Aggravating factors: not sure Relieving factors: steroid for eye has helped hip pain   PRECAUTIONS: None  WEIGHT BEARING RESTRICTIONS: No  FALLS:  Has patient fallen in last 6 months? No  LIVING ENVIRONMENT: Lives with: lives alone Lives in: House/apartment   OCCUPATION: retired but still doing office work (sitting)  PLOF: Independent  PATIENT GOALS: t ofind out how she can stop urinary problem  PERTINENT HISTORY:  G2P2, sleep apnea, stroke 2000 Sexual abuse: No  BOWEL MOVEMENT: Pain with bowel movement: Yes Type of bowel movement:Frequency dialy and Strain Yes Fully empty rectum: Yes: - Leakage: No Pads: No Fiber supplement: No  URINATION: Pain with urination: No Fully empty bladder: Yes: - Stream: Strong Urgency: Yes: difficulty with getting home after work - coming in door she starts to urinate and cannot stop Frequency: 4-5x/day, 2-3x/night Leakage: Urge to void and Walking to the bathroom Pads: Yes: 1 pad a day  INTERCOURSE: Not currently sexually active, history of pain even prior to menopause  PREGNANCY: Vaginal deliveries 2  PROLAPSE: None   OBJECTIVE:  12/08/22:   COGNITION: Overall cognitive status: Within functional limits for tasks assessed     SENSATION: Light touch: Appears intact Proprioception: Appears intact  MUSCLE LENGTH:   FUNCTIONAL TESTS:    GAIT: Antalgic gait pattern Lt with compensated trendelenburg  POSTURE: rounded shoulders, forward head, decreased lumbar lordosis, increased thoracic kyphosis, and posterior pelvic tilt  PELVIC ALIGNMENT:  LUMBARAROM/PROM:  A/PROM A/PROM  eval  Flexion   Extension   Right lateral flexion   Left lateral flexion   Right rotation   Left  rotation    (Blank rows = not tested)  LOWER EXTREMITY ROM:   LOWER EXTREMITY MMT:  MMT Right eval Left eval  Hip flexion    Hip extension    Hip abduction    Hip adduction    Hip internal rotation    Hip external rotation    Knee flexion    Knee extension    Ankle dorsiflexion    Ankle plantarflexion    Ankle inversion    Ankle eversion     PALPATION:   General  No abdominal tenderness/tightness noted                External Perineal Exam redness and irritation on labia majora, labia minor with some palor and purple coloration - area felt irritated per patient                             Internal Pelvic Floor burning and irritation throughout superficial pelvic floor   Patient confirms identification and approves PT to assess internal pelvic floor and treatment Yes Pt provides verbal consent for internal vaginal/rectal pelvic floor exam.   PELVIC MMT:   MMT eval  Vaginal 3/5, 3 second endurance, 4 repeat contraction with increased co-contraction with each repetition; difficulty with full relaxation  Internal Anal Sphincter   External Anal Sphincter   Puborectalis   Diastasis Recti   (Blank rows = not tested)        TONE: High  PROLAPSE: WNL  TODAY'S TREATMENT:  DATE: 12/08/22  EVAL  Neuromuscular re-education: Pelvic floor contraction training with quick flicks and breath coordination Therapeutic activities: Urge suppression technique Bladder irritants    PATIENT EDUCATION:  Education details: see above Person educated: Patient Education method: Explanation, Demonstration, Tactile cues, Verbal cues, and Handouts Education comprehension: verbalized understanding  HOME EXERCISE PROGRAM: [id number]  ASSESSMENT:  CLINICAL IMPRESSION: Patient is a 77 y.o. female who was seen today for physical therapy evaluation and treatment for  urinary urgency/incontinence. Exam findings notable for vulvar irritation and symptoms of decreased estrogen, vulvar and vaginal discomfort with palpation, increased pelvic floor muscle tone in superficial and deep pelvic floor, pelvic floor weakness, decreased pelvic floor endurance, and difficulty with motor control of pelvic floor contraction demonstrated by co-contraction of bil LE muscles. Signs and symptoms are most consistent with increased pelvic floor tone leading to symptoms of urgency and fatigue of muscles causing reduced endurance/ability to hold urine; believe that Lt hip and low back pain are related to pelvic floor dysfunction. Initial treatment included pelvic floor contraction training with breath coordination and complete relaxation; she was able to perform urge suppression technique to help give her more control in trigger situations. She will benefit from skilled PT intervention in order to decrease urgency, improve urge incontinence, decrease low back/Lt hip pain, and improve QOL.   OBJECTIVE IMPAIRMENTS: Abnormal gait, decreased activity tolerance, decreased coordination, decreased endurance, decreased mobility, decreased strength, increased fascial restrictions, increased muscle spasms, impaired tone, postural dysfunction, and pain.   ACTIVITY LIMITATIONS: continence  PARTICIPATION LIMITATIONS: community activity  PERSONAL FACTORS: 1 comorbidity: G2P2  are also affecting patient's functional outcome.   REHAB POTENTIAL: Good  CLINICAL DECISION MAKING: Stable/uncomplicated  EVALUATION COMPLEXITY: Low   GOALS: Goals reviewed with patient? Yes  SHORT TERM GOALS: Target date: 01/12/23  Pt will be independent with HEP.   Baseline: Goal status: INITIAL  2. Pt will be able to teach back and utilize urge suppression technique in order to help reduce number of trips to the bathroom.     Baseline:  Goal status: INITIAL  3.  Pt will be independent with use of squatty potty,  relaxed toileting mechanics, and improved bowel movement techniques in order to increase ease of bowel movements and complete evacuation.   Baseline:  Goal status: INITIAL  4.  Pt will be independent with diaphragmatic breathing and down training activities in order to improve pelvic floor relaxation.  Baseline:  Goal status: INITIAL    LONG TERM GOALS: Target date: 02/16/23  Pt will be independent with advanced HEP.   Baseline:  Goal status: INITIAL  2.  Pt will demonstrate normal pelvic floor muscle tone and A/ROM, able to achieve 4/5 strength with contractions and 10 sec endurance, in order to provide appropriate lumbopelvic support in functional activities.   Baseline:  Goal status: INITIAL  3.  Pt will be able to go 2-3 hours in between voids without urgency or incontinence in order to improve QOL and perform all functional activities with less difficulty.   Baseline:  Goal status: INITIAL  4.  Pt will be able to walk into house after work without any urgency and urge incontinence. Baseline:  Goal status: INITIAL    PLAN:  PT FREQUENCY: 1-2x/week  PT DURATION: 10 weeks  PLANNED INTERVENTIONS: Therapeutic exercises, Therapeutic activity, Neuromuscular re-education, Balance training, Gait training, Patient/Family education, Self Care, Joint mobilization, Dry Needling, Biofeedback, and Manual therapy  PLAN FOR NEXT SESSION: Plan to begin core training; incorporate pelvic floor and core into  functional exercises. Mobility/down training to improve pelvic floor relaxation.    Heather Roberts, PT, DPT01/03/245:03 PM

## 2022-12-08 NOTE — Patient Instructions (Signed)
Urge suppression technique: A technique to help you hold urine until it's an appropriate time to go, whether this is making it home or trying to reach a specific voiding time frame according to your schedule. It helps to send signals from the bladder to the brain that say you don't actually have to void urine right now. This most likely only give you several minutes of relief at first, but repeat as needed; the benefit will last longer as you use this technique more and get into better bladder habits. ?  The technique: o Perform 5 quick flicks (Kegels) rapidly, not worrying about fully relaxing in between each (only in this technique). o Then perform several deep belly breaths while focusing on relaxing the pelvic floor. o Go do something else to help distract yourself from the urge to urinate. o Repeat as needed.   [facility] [address] [place], [place] [address] Phone # [phone] Fax [phone]

## 2022-12-20 ENCOUNTER — Ambulatory Visit: Payer: Medicare Other

## 2022-12-20 DIAGNOSIS — M6281 Muscle weakness (generalized): Secondary | ICD-10-CM

## 2022-12-20 DIAGNOSIS — R293 Abnormal posture: Secondary | ICD-10-CM

## 2022-12-20 DIAGNOSIS — R279 Unspecified lack of coordination: Secondary | ICD-10-CM

## 2022-12-20 DIAGNOSIS — N3941 Urge incontinence: Secondary | ICD-10-CM

## 2022-12-20 DIAGNOSIS — R3915 Urgency of urination: Secondary | ICD-10-CM

## 2022-12-20 NOTE — Therapy (Addendum)
OUTPATIENT PHYSICAL THERAPY TREATMENT NOTE   Patient Name: Leah Knight MRN: [id number] DOB:19-May-1946, 77 y.o., female Today's Date: 12/20/2022  PCP: Gaspar Garbe, MD REFERRING PROVIDER: Zelphia Cairo, MD   END OF SESSION:   PT End of Session - 12/20/22 0759     Visit Number 2    Date for PT Re-Evaluation 02/16/23    Authorization Type [employer] Medicare    PT Start Time 0757    PT Stop Time 0835    PT Time Calculation (min) 38 min    Activity Tolerance Patient tolerated treatment well    Behavior During Therapy [facility] for tasks assessed/performed             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   Hx of retinal hemorrhage    Hyperlipemia    Hypertension    Sleep apnea    Stroke [facility])    Past Surgical History:  Procedure Laterality Date   CATARACT EXTRACTION, BILATERAL  summer 2021   childbirth     x 2   Patient Active Problem List   Diagnosis Date Noted   OSA on CPAP 12/18/2020   Poor compliance with CPAP treatment 12/18/2020   Hx of retinal hemorrhage    H/O: stroke     REFERRING DIAG: N39.41 (ICD-10-CM) - Urge incontinence  THERAPY DIAG:  Urinary urgency  Urge incontinence  Abnormal posture  Muscle weakness (generalized)  Unspecified lack of coordination  Rationale for Evaluation and Treatment Rehabilitation  PERTINENT HISTORY: G2P2, sleep apnea, stroke 2000  PRECAUTIONS: NA  SUBJECTIVE:                                                                                                                                                                                      SUBJECTIVE STATEMENT:  Pt states that she has not had any leaking since her first visit.    PAIN:  Are you having pain? Yes: NPRS scale: 0/10 Pain location: - Pain description: - Aggravating factors: - Relieving factors: -  12/08/22 SUBJECTIVE STATEMENT: Pt states  that she cannot hold urine on her way to the bathroom. This has been getting worse over the last couple of years.  She has had bil hip pain over the last couple of months; she is limping because of Lt hip pain. No exercise program.  Fluid intake: Yes: not much, partly because of leaking; she drinks coffee in the morning (2 cups)     PAIN:  Are you having pain? Yes NPRS scale: 3/10, 9/10 at  worst Pain location:  low back/bil hip pain   Pain type: aching Pain description: intermittent    Aggravating factors: not sure Relieving factors: steroid for eye has helped hip pain    PRECAUTIONS: None   WEIGHT BEARING RESTRICTIONS: No   FALLS:  Has patient fallen in last 6 months? No   LIVING ENVIRONMENT: Lives with: lives alone Lives in: House/apartment     OCCUPATION: retired but still doing office work (sitting)   PLOF: Independent   PATIENT GOALS: t ofind out how she can stop urinary problem   PERTINENT HISTORY:  G2P2, sleep apnea, stroke 2000 Sexual abuse: No   BOWEL MOVEMENT: Pain with bowel movement: Yes Type of bowel movement:Frequency dialy and Strain Yes Fully empty rectum: Yes: - Leakage: No Pads: No Fiber supplement: No   URINATION: Pain with urination: No Fully empty bladder: Yes: - Stream: Strong Urgency: Yes: difficulty with getting home after work - coming in door she starts to urinate and cannot stop Frequency: 4-5x/day, 2-3x/night Leakage: Urge to void and Walking to the bathroom Pads: Yes: 1 pad a day   INTERCOURSE: Not currently sexually active, history of pain even prior to menopause   PREGNANCY: Vaginal deliveries 2   PROLAPSE: None     OBJECTIVE:  12/08/22:     COGNITION: Overall cognitive status: Within functional limits for tasks assessed                          SENSATION: Light touch: Appears intact Proprioception: Appears intact   MUSCLE LENGTH:     FUNCTIONAL TESTS:      GAIT: Antalgic gait pattern Lt with compensated  trendelenburg   POSTURE: rounded shoulders, forward head, decreased lumbar lordosis, increased thoracic kyphosis, and posterior pelvic tilt   PELVIC ALIGNMENT:   LUMBARAROM/PROM:   A/PROM A/PROM  eval  Flexion    Extension    Right lateral flexion    Left lateral flexion    Right rotation    Left rotation     (Blank rows = not tested)   LOWER EXTREMITY ROM:     LOWER EXTREMITY MMT:   MMT Right eval Left eval  Hip flexion      Hip extension      Hip abduction      Hip adduction      Hip internal rotation      Hip external rotation      Knee flexion      Knee extension      Ankle dorsiflexion      Ankle plantarflexion      Ankle inversion      Ankle eversion        PALPATION:   General  No abdominal tenderness/tightness noted                 External Perineal Exam redness and irritation on labia majora, labia minor with some palor and purple coloration - area felt irritated per patient                             Internal Pelvic Floor burning and irritation throughout superficial pelvic floor    Patient confirms identification and approves PT to assess internal pelvic floor and treatment Yes Pt provides verbal consent for internal vaginal/rectal pelvic floor exam.     PELVIC MMT:   MMT eval  Vaginal 3/5, 3 second endurance, 4 repeat contraction with increased co-contraction  with each repetition; difficulty with full relaxation  Internal Anal Sphincter    External Anal Sphincter    Puborectalis    Diastasis Recti    (Blank rows = not tested)         TONE: High   PROLAPSE: WNL   TODAY'S TREATMENT 12/20/21:    Exercises: Bent knee fall in 4 x 10 sec bil Lower trunk rotation 2 x 10 Piriformis stretch 60 sec bil Half butterfly in supine with pillow support Piriformis stretch 60 sec bil, seated to avoid anterior hip pinching Therapeutic activities: Squatty potty and relaxed toileting mechanics Balloon breathing Bowel massage                                                                                                                             DATE: 12/08/22  EVAL  Neuromuscular re-education: Pelvic floor contraction training with quick flicks and breath coordination Therapeutic activities: Urge suppression technique Bladder irritants       PATIENT EDUCATION:  Education details: see above Person educated: Patient Education method: Explanation, Demonstration, Tactile cues, Verbal cues, and Handouts Education comprehension: verbalized understanding   HOME EXERCISE PROGRAM: [id number]   ASSESSMENT:   CLINICAL IMPRESSION: Pt is doing very demonstrated by no leaking since her first visit. To help reduce straining, toileting mechanics reviewed and she was encouraged to use a squatty potty and perform regular bowel massage. She did well with mobility exercises to improve pelvic floor lengthening; some exercises needed modifying in order to decrease pinching/pain in anterior Lt hip. She did well with gentle strengthening exercises. Believe due to quick progress, pt may be ready for D/C after more functional strengthening next session. She will benefit from skilled PT intervention in order to decrease urgency, improve urge incontinence, decrease low back/Lt hip pain, and improve QOL.    OBJECTIVE IMPAIRMENTS: Abnormal gait, decreased activity tolerance, decreased coordination, decreased endurance, decreased mobility, decreased strength, increased fascial restrictions, increased muscle spasms, impaired tone, postural dysfunction, and pain.    ACTIVITY LIMITATIONS: continence   PARTICIPATION LIMITATIONS: community activity   PERSONAL FACTORS: 1 comorbidity: G2P2  are also affecting patient's functional outcome.    REHAB POTENTIAL: Good   CLINICAL DECISION MAKING: Stable/uncomplicated   EVALUATION COMPLEXITY: Low     GOALS: Goals reviewed with patient? Yes   SHORT TERM GOALS: Target date: 01/12/23   Pt will be independent with HEP.     Baseline: Goal status: INITIAL   2. Pt will be able to teach back and utilize urge suppression technique in order to help reduce number of trips to the bathroom.       Baseline:  Goal status: INITIAL   3.  Pt will be independent with use of squatty potty, relaxed toileting mechanics, and improved bowel movement techniques in order to increase ease of bowel movements and complete evacuation.    Baseline:  Goal status: INITIAL   4.  Pt will be independent with diaphragmatic breathing and down training  activities in order to improve pelvic floor relaxation.   Baseline:  Goal status: INITIAL       LONG TERM GOALS: Target date: 02/16/23   Pt will be independent with advanced HEP.    Baseline:  Goal status: INITIAL   2.  Pt will demonstrate normal pelvic floor muscle tone and A/ROM, able to achieve 4/5 strength with contractions and 10 sec endurance, in order to provide appropriate lumbopelvic support in functional activities.    Baseline:  Goal status: INITIAL   3.  Pt will be able to go 2-3 hours in between voids without urgency or incontinence in order to improve QOL and perform all functional activities with less difficulty.    Baseline:  Goal status: INITIAL   4.  Pt will be able to walk into house after work without any urgency and urge incontinence. Baseline:  Goal status: INITIAL       PLAN:   PT FREQUENCY: 1-2x/week   PT DURATION: 10 weeks   PLANNED INTERVENTIONS: Therapeutic exercises, Therapeutic activity, Neuromuscular re-education, Balance training, Gait training, Patient/Family education, Self Care, Joint mobilization, Dry Needling, Biofeedback, and Manual therapy   PLAN FOR NEXT SESSION: Progress functional core/pelvic floor strengthening activities; possible D/C   Julio Alm, PT, DPT01/15/248:37 AM  PHYSICAL THERAPY DISCHARGE SUMMARY  Visits from Start of Care: 2  Current functional level related to goals / functional  outcomes: Independent   Remaining deficits: See above   Education / Equipment: 2   Patient agrees to discharge. Patient goals were partially met. Patient is being discharged due to not returning since the last visit.  Julio Alm, PT, DPT02/03/253:19 PM

## 2022-12-20 NOTE — Patient Instructions (Signed)
Squatty potty: When your knees are level or below the level of your hips, pelvic floor muscles are pressed against rectum, preventing ease of bowel movement. By getting knees above the level of the hips, these pelvic floor muscles relax, allowing easier passage of bowel movement.  Ways to get knees above hips: o Squatty Potty (7inch and 9inch versions) o Small stool o Roll of toilet paper under each foot o Hardback book or stack of magazines under each foot  Relaxed Toileting mechanics: Once in this position, make sure to lean forward with forearms on thighs, wide knees, relaxed stomach, and breathe.  **Balloon breathing** If you have to strain while having a bowel movement.    Bowel massage: To assist with more regular and more comfortable bowel movements, try performing bowel massage nightly for 5-10 minutes. Place hands in the lower right side of your abdomen to start; in small circles, massage up, across, and down the left side of your abdomen. Pressure does not need to be hard, but just comfortable. You can use lotion or oil to make more comfortable.   [facility] [address], [place], [place] [address] Phone # [phone] Fax [phone]

## 2023-05-31 ENCOUNTER — Emergency Department (HOSPITAL_COMMUNITY): Payer: Medicare Other

## 2023-05-31 ENCOUNTER — Encounter (HOSPITAL_COMMUNITY): Payer: Self-pay

## 2023-05-31 ENCOUNTER — Encounter (HOSPITAL_COMMUNITY): Payer: Medicare Other

## 2023-05-31 ENCOUNTER — Inpatient Hospital Stay (HOSPITAL_COMMUNITY)
Admission: EM | Admit: 2023-05-31 | Discharge: 2023-06-02 | DRG: 164 | Disposition: A | Discharge status: Home / Self Care | Payer: Medicare Other | Attending: Internal Medicine | Admitting: Internal Medicine

## 2023-05-31 DIAGNOSIS — I82402 Acute embolism and thrombosis of unspecified deep veins of left lower extremity: Secondary | ICD-10-CM | POA: Diagnosis present

## 2023-05-31 DIAGNOSIS — G4733 Obstructive sleep apnea (adult) (pediatric): Secondary | ICD-10-CM | POA: Diagnosis present

## 2023-05-31 DIAGNOSIS — Z9842 Cataract extraction status, left eye: Secondary | ICD-10-CM

## 2023-05-31 DIAGNOSIS — R58 Hemorrhage, not elsewhere classified: Secondary | ICD-10-CM | POA: Diagnosis not present

## 2023-05-31 DIAGNOSIS — Z86711 Personal history of pulmonary embolism: Secondary | ICD-10-CM | POA: Diagnosis not present

## 2023-05-31 DIAGNOSIS — Z882 Allergy status to sulfonamides status: Secondary | ICD-10-CM

## 2023-05-31 DIAGNOSIS — I82452 Acute embolism and thrombosis of left peroneal vein: Secondary | ICD-10-CM | POA: Diagnosis present

## 2023-05-31 DIAGNOSIS — Z8673 Personal history of transient ischemic attack (TIA), and cerebral infarction without residual deficits: Secondary | ICD-10-CM

## 2023-05-31 DIAGNOSIS — I272 Pulmonary hypertension, unspecified: Secondary | ICD-10-CM | POA: Diagnosis present

## 2023-05-31 DIAGNOSIS — N1831 Chronic kidney disease, stage 3a: Secondary | ICD-10-CM | POA: Diagnosis present

## 2023-05-31 DIAGNOSIS — I129 Hypertensive chronic kidney disease with stage 1 through stage 4 chronic kidney disease, or unspecified chronic kidney disease: Secondary | ICD-10-CM | POA: Diagnosis present

## 2023-05-31 DIAGNOSIS — I2602 Saddle embolus of pulmonary artery with acute cor pulmonale: Principal | ICD-10-CM | POA: Diagnosis present

## 2023-05-31 DIAGNOSIS — E785 Hyperlipidemia, unspecified: Secondary | ICD-10-CM | POA: Diagnosis present

## 2023-05-31 DIAGNOSIS — Z1152 Encounter for screening for COVID-19: Secondary | ICD-10-CM | POA: Diagnosis not present

## 2023-05-31 DIAGNOSIS — I82432 Acute embolism and thrombosis of left popliteal vein: Secondary | ICD-10-CM | POA: Diagnosis present

## 2023-05-31 DIAGNOSIS — Z9841 Cataract extraction status, right eye: Secondary | ICD-10-CM

## 2023-05-31 DIAGNOSIS — K219 Gastro-esophageal reflux disease without esophagitis: Secondary | ICD-10-CM | POA: Diagnosis present

## 2023-05-31 DIAGNOSIS — Z8249 Family history of ischemic heart disease and other diseases of the circulatory system: Secondary | ICD-10-CM

## 2023-05-31 DIAGNOSIS — I10 Essential (primary) hypertension: Secondary | ICD-10-CM | POA: Diagnosis not present

## 2023-05-31 DIAGNOSIS — Z79899 Other long term (current) drug therapy: Secondary | ICD-10-CM

## 2023-05-31 HISTORY — PX: IR ANGIOGRAM SELECTIVE EACH ADDITIONAL VESSEL: IMG667

## 2023-05-31 HISTORY — PX: IR US GUIDE VASC ACCESS RIGHT: IMG2390

## 2023-05-31 HISTORY — PX: IR ANGIOGRAM PULMONARY BILATERAL SELECTIVE: IMG664

## 2023-05-31 HISTORY — PX: IR THROMBECT PRIM MECH INIT (INCLU) MOD SED: IMG2297

## 2023-05-31 LAB — BASIC METABOLIC PANEL
Anion gap: 10 (ref 5–15)
BUN: 17 mg/dL (ref 8–23)
CO2: 21 mmol/L — ABNORMAL LOW (ref 22–32)
Calcium: 9 mg/dL (ref 8.9–10.3)
Chloride: 108 mmol/L (ref 98–111)
Creatinine, Ser: 1.25 mg/dL — ABNORMAL HIGH (ref 0.44–1.00)
GFR, Estimated: 45 mL/min — ABNORMAL LOW (ref >60)
Glucose, Bld: 126 mg/dL — ABNORMAL HIGH (ref 70–99)
Potassium: 4.3 mmol/L (ref 3.5–5.1)
Sodium: 139 mmol/L (ref 135–145)

## 2023-05-31 LAB — HEPARIN LEVEL (UNFRACTIONATED): Heparin Unfractionated: >1.1 IU/mL — ABNORMAL HIGH (ref 0.30–0.70)

## 2023-05-31 LAB — PROTIME-INR
INR: 1.1 (ref 0.8–1.2)
Prothrombin Time: 14.2 seconds (ref 11.4–15.2)

## 2023-05-31 LAB — CBC WITH DIFFERENTIAL/PLATELET
Abs Immature Granulocytes: 0.05 10*3/uL (ref 0.00–0.07)
Basophils Absolute: 0 10*3/uL (ref 0.0–0.1)
Basophils Relative: 1 %
Eosinophils Absolute: 0.1 10*3/uL (ref 0.0–0.5)
Eosinophils Relative: 2 %
HCT: 41.8 % (ref 36.0–46.0)
Hemoglobin: 13.9 g/dL (ref 12.0–15.0)
Immature Granulocytes: 1 %
Lymphocytes Relative: 17 %
Lymphs Abs: 1.5 10*3/uL (ref 0.7–4.0)
MCH: 31 pg (ref 26.0–34.0)
MCHC: 33.3 g/dL (ref 30.0–36.0)
MCV: 93.1 fL (ref 80.0–100.0)
Monocytes Absolute: 0.9 10*3/uL (ref 0.1–1.0)
Monocytes Relative: 11 %
Neutro Abs: 5.8 10*3/uL (ref 1.7–7.7)
Neutrophils Relative %: 68 %
Platelets: 179 10*3/uL (ref 150–400)
RBC: 4.49 MIL/uL (ref 3.87–5.11)
RDW: 13.6 % (ref 11.5–15.5)
WBC: 8.4 10*3/uL (ref 4.0–10.5)
nRBC: 0 % (ref 0.0–0.2)

## 2023-05-31 LAB — TROPONIN I (HIGH SENSITIVITY)
Troponin I (High Sensitivity): 102 ng/L (ref <18)
Troponin I (High Sensitivity): 152 ng/L (ref <18)

## 2023-05-31 LAB — SARS CORONAVIRUS 2 BY RT PCR: SARS Coronavirus 2 by RT PCR: NEGATIVE

## 2023-05-31 LAB — BRAIN NATRIURETIC PEPTIDE: B Natriuretic Peptide: 271.8 pg/mL — ABNORMAL HIGH (ref 0.0–100.0)

## 2023-05-31 MED ORDER — LIDOCAINE HCL 1 % IJ SOLN
INTRAMUSCULAR | Status: AC
  Filled 2023-05-31: qty 20

## 2023-05-31 MED ORDER — MIDAZOLAM HCL 2 MG/2ML IJ SOLN
INTRAMUSCULAR | Status: AC
  Filled 2023-05-31: qty 2

## 2023-05-31 MED ORDER — HYDRALAZINE HCL 20 MG/ML IJ SOLN: 5.0000 mg | INTRAMUSCULAR | Status: DC | PRN

## 2023-05-31 MED ORDER — OXYCODONE HCL 5 MG PO TABS: 5.0000 mg | ORAL_TABLET | ORAL | Status: DC | PRN

## 2023-05-31 MED ORDER — MORPHINE SULFATE (PF) 2 MG/ML IV SOLN: 2.0000 mg | INTRAVENOUS | Status: DC | PRN

## 2023-05-31 MED ORDER — DOCUSATE SODIUM 100 MG PO CAPS
100.0000 mg | ORAL_CAPSULE | Freq: Two times a day (BID) | ORAL | Status: DC
  Administered 2023-05-31 – 2023-06-01 (×4): 100 mg via ORAL
  Filled 2023-05-31 (×6): qty 1

## 2023-05-31 MED ORDER — HEPARIN SODIUM (PORCINE) 1000 UNIT/ML IJ SOLN
INTRAMUSCULAR | Status: AC
  Filled 2023-05-31: qty 10

## 2023-05-31 MED ORDER — IOHEXOL 300 MG/ML  SOLN
100.0000 mL | Freq: Once | INTRAMUSCULAR | Status: AC | PRN
  Administered 2023-05-31: 50 mL via INTRAVENOUS

## 2023-05-31 MED ORDER — ACETAMINOPHEN 650 MG RE SUPP: 650.0000 mg | Freq: Four times a day (QID) | RECTAL | Status: DC | PRN

## 2023-05-31 MED ORDER — HEPARIN SODIUM (PORCINE) 1000 UNIT/ML IJ SOLN
INTRAMUSCULAR | Status: AC | PRN
  Administered 2023-05-31: 5000 [IU] via INTRAVENOUS

## 2023-05-31 MED ORDER — IOHEXOL 350 MG/ML SOLN
75.0000 mL | Freq: Once | INTRAVENOUS | Status: AC | PRN
  Administered 2023-05-31: 75 mL via INTRAVENOUS

## 2023-05-31 MED ORDER — FENTANYL CITRATE (PF) 100 MCG/2ML IJ SOLN
INTRAMUSCULAR | Status: AC
  Filled 2023-05-31: qty 2

## 2023-05-31 MED ORDER — BISACODYL 5 MG PO TBEC: 5.0000 mg | DELAYED_RELEASE_TABLET | Freq: Every day | ORAL | Status: DC | PRN

## 2023-05-31 MED ORDER — FENTANYL CITRATE (PF) 100 MCG/2ML IJ SOLN
INTRAMUSCULAR | Status: AC | PRN
  Administered 2023-05-31 (×2): 12.5 ug via INTRAVENOUS

## 2023-05-31 MED ORDER — HEPARIN BOLUS VIA INFUSION
4000.0000 [IU] | Freq: Once | INTRAVENOUS | Status: AC
  Administered 2023-05-31: 4000 [IU] via INTRAVENOUS
  Filled 2023-05-31: qty 4000

## 2023-05-31 MED ORDER — ACETAMINOPHEN 325 MG PO TABS: 650.0000 mg | ORAL_TABLET | Freq: Four times a day (QID) | ORAL | Status: DC | PRN

## 2023-05-31 MED ORDER — HEPARIN (PORCINE) 25000 UT/250ML-% IV SOLN
1000.0000 [IU]/h | INTRAVENOUS | Status: DC
  Administered 2023-05-31: 1000 [IU]/h via INTRAVENOUS

## 2023-05-31 MED ORDER — LIDOCAINE HCL 1 % IJ SOLN
20.0000 mL | Freq: Once | INTRAMUSCULAR | Status: AC
  Administered 2023-05-31: 10 mL via INTRADERMAL

## 2023-05-31 MED ORDER — LACTATED RINGERS IV SOLN: INTRAVENOUS | Status: DC

## 2023-05-31 MED ORDER — MIDAZOLAM HCL 2 MG/2ML IJ SOLN
INTRAMUSCULAR | Status: AC | PRN
  Administered 2023-05-31: 1 mg via INTRAVENOUS
  Administered 2023-05-31 (×2): .5 mg via INTRAVENOUS

## 2023-05-31 MED ORDER — ATORVASTATIN CALCIUM 10 MG PO TABS
10.0000 mg | ORAL_TABLET | Freq: Every day | ORAL | Status: DC
  Administered 2023-06-01 – 2023-06-02 (×2): 10 mg via ORAL
  Filled 2023-05-31 (×2): qty 1

## 2023-05-31 MED ORDER — ONDANSETRON HCL 4 MG/2ML IJ SOLN: 4.0000 mg | Freq: Four times a day (QID) | INTRAMUSCULAR | Status: DC | PRN

## 2023-05-31 MED ORDER — SODIUM CHLORIDE 0.9% FLUSH
3.0000 mL | Freq: Two times a day (BID) | INTRAVENOUS | Status: DC
  Administered 2023-05-31 – 2023-06-02 (×5): 3 mL via INTRAVENOUS

## 2023-05-31 MED ORDER — POLYETHYLENE GLYCOL 3350 17 G PO PACK: 17.0000 g | PACK | Freq: Every day | ORAL | Status: DC | PRN

## 2023-05-31 MED ORDER — PROPRANOLOL HCL 10 MG PO TABS
40.0000 mg | ORAL_TABLET | Freq: Every day | ORAL | Status: DC
  Administered 2023-06-01 – 2023-06-02 (×2): 40 mg via ORAL
  Filled 2023-05-31 (×2): qty 4

## 2023-05-31 MED ORDER — LIDOCAINE HCL 1 % IJ SOLN
20.0000 mL | Freq: Once | INTRAMUSCULAR | Status: AC
  Administered 2023-05-31: 10 mL via INTRADERMAL
  Filled 2023-05-31: qty 20

## 2023-05-31 MED ORDER — HEPARIN (PORCINE) 25000 UT/250ML-% IV SOLN
1100.0000 [IU]/h | INTRAVENOUS | Status: DC
  Administered 2023-05-31: 1100 [IU]/h via INTRAVENOUS
  Filled 2023-05-31: qty 250

## 2023-05-31 MED ORDER — PANTOPRAZOLE SODIUM 40 MG PO TBEC
40.0000 mg | DELAYED_RELEASE_TABLET | Freq: Every day | ORAL | Status: DC
  Administered 2023-06-01 – 2023-06-02 (×2): 40 mg via ORAL
  Filled 2023-05-31 (×2): qty 1

## 2023-05-31 MED ORDER — ORAL CARE MOUTH RINSE: 15.0000 mL | OROMUCOSAL | Status: DC | PRN

## 2023-05-31 MED ORDER — ONDANSETRON HCL 4 MG PO TABS: 4.0000 mg | ORAL_TABLET | Freq: Four times a day (QID) | ORAL | Status: DC | PRN

## 2023-05-31 NOTE — Procedures (Signed)
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  Procedure:   PULMONARY ARTERIOGRAPHY MECHANICAL THROMBECTOMY, Catheter-directed   Anesthesia: Conscious Sedation Complications: None Estimated Blood Loss:  100 mL Specimens: Pathology  Findings:  - access via the RIGHT  saphenous  vein. - PE burden at pulmonary arterial bifurcations - Successful mechanical thrombectomy with 16 F Penumbra Lightning Flash V2 - Good angiographic result and improved Pt parameters (HR, O2 requirement and SpO2 at the end of the case  Plan: - Post sheath removal precautions. Bedrest with RLE straight x2hrs. - Purse string suture at R groin. Will be removed by VIR service on rounds in AM.  Final report to follow once all images are reviewed and compared with previous studies.  See detailed dictation with images in PACS. The patient tolerated the procedure well without incident or complication and was returned to Recovery in stable condition.    Roanna Banning, MD [facility] Radiology   Pager. [phone] Clinic. [phone]

## 2023-05-31 NOTE — ED Notes (Signed)
Pt transported to OR by OR staff.

## 2023-05-31 NOTE — ED Notes (Signed)
Called lab to add on INR 

## 2023-05-31 NOTE — ED Provider Notes (Signed)
[place] EMERGENCY DEPARTMENT AT [facility] Provider Note   CSN: [id number] Arrival date & time: 05/31/23  0813     History  Chief Complaint  Patient presents with   Chest Pain   Shortness of Breath    Leah Knight is a 77 y.o. female.  Pt is a 77 yo female with pmhx significant for gerd, cva, hld, htn, and sleep apnea. Pt woke up this am feeling very sob.  She became so sob when she walked from her bed to the bathroom that she had to stop and rest.  Pt said she has not felt this way before.  EMS gave pt asa 324 mg and oxygen en route.  Pt is 87% on RA when she arrived here.         Home Medications Prior to Admission medications   Medication Sig Start Date End Date Taking? Authorizing Provider  Acetaminophen (ARTHRITIS PAIN PO) Take 1,000 mg by mouth daily.    [provider]  atorvastatin (LIPITOR) 10 MG tablet Take 10 mg by mouth daily.    [provider]  Cholecalciferol 50 MCG (2000 UT) CAPS Take 1 tablet by mouth daily. Patient not taking: Reported on 12/08/2022    [provider]  losartan (COZAAR) 50 MG tablet Take 50 mg by mouth daily.    [provider]  omeprazole (PRILOSEC) 40 MG capsule Take 1 capsule (40 mg total) by mouth in the morning and at bedtime. 10/20/22   Tressia Danas, MD  propranolol (INDERAL) 40 MG tablet Take 40 mg by mouth daily.    [provider]  vitamin B-12 (CYANOCOBALAMIN) 100 MCG tablet Take 1 tablet by mouth daily.    [provider]      Allergies    Sulfa antibiotics    Review of Systems   Review of Systems  Respiratory:  Positive for shortness of breath.   All other systems reviewed and are negative.   Physical Exam Updated Vital Signs BP 130/84   Pulse 88   Temp 98 F (36.7 C) (Oral)   Resp (!) 23   Ht 5\' 2"  (1.575 m)   Wt 73.9 kg   SpO2 95%   BMI 29.81 kg/m  Physical Exam Vitals and nursing note reviewed.  Constitutional:      Appearance: She  is well-developed.  HENT:     Head: Normocephalic and atraumatic.  Eyes:     Extraocular Movements: Extraocular movements intact.     Pupils: Pupils are equal, round, and reactive to light.  Cardiovascular:     Rate and Rhythm: Normal rate and regular rhythm.     Heart sounds: Normal heart sounds.  Pulmonary:     Effort: Pulmonary effort is normal.     Breath sounds: Rhonchi present.  Abdominal:     General: Bowel sounds are normal.     Palpations: Abdomen is soft.  Musculoskeletal:        General: Normal range of motion.     Cervical back: Normal range of motion and neck supple.  Skin:    General: Skin is warm.     Capillary Refill: Capillary refill takes less than 2 seconds.  Neurological:     General: No focal deficit present.     Mental Status: She is alert and oriented to person, place, and time.  Psychiatric:        Mood and Affect: Mood normal.        Behavior: Behavior normal.  ED Results / Procedures / Treatments   Labs (all labs ordered are listed, but only abnormal results are displayed) Labs Reviewed  BASIC METABOLIC PANEL - Abnormal; Notable for the following components:      Result Value   CO2 21 (*)    Glucose, Bld 126 (*)    Creatinine, Ser 1.25 (*)    GFR, Estimated 45 (*)    All other components within normal limits  BRAIN NATRIURETIC PEPTIDE - Abnormal; Notable for the following components:   B Natriuretic Peptide 271.8 (*)    All other components within normal limits  TROPONIN I (HIGH SENSITIVITY) - Abnormal; Notable for the following components:   Troponin I (High Sensitivity) 102 (*)    All other components within normal limits  SARS CORONAVIRUS 2 BY RT PCR  CBC WITH DIFFERENTIAL/PLATELET  HEPARIN LEVEL (UNFRACTIONATED)  TROPONIN I (HIGH SENSITIVITY)    EKG EKG Interpretation  Date/Time:  Tuesday May 31 2023 08:16:52 EDT Ventricular Rate:  94 PR Interval:  156 QRS Duration: 88 QT Interval:  378 QTC Calculation: 473 R  Axis:   -65 Text Interpretation: Sinus rhythm Probable left atrial enlargement Inferior infarct, old Probable anterior infarct, age indeterminate No old tracing to compare Confirmed by Jacalyn Lefevre [phone]) on 05/31/2023 8:20:43 AM  Radiology CT Angio Chest PE W and/or Wo Contrast  Result Date: 05/31/2023 CLINICAL DATA:  Shortness of breath. EXAM: CT ANGIOGRAPHY CHEST WITH CONTRAST TECHNIQUE: Multidetector CT imaging of the chest was performed using the standard protocol during bolus administration of intravenous contrast. Multiplanar CT image reconstructions and MIPs were obtained to evaluate the vascular anatomy. RADIATION DOSE REDUCTION: This exam was performed according to the departmental dose-optimization program which includes automated exposure control, adjustment of the mA and/or kV according to patient size and/or use of iterative reconstruction technique. CONTRAST:  75mL OMNIPAQUE IOHEXOL 350 MG/ML SOLN COMPARISON:  Radiograph of same day. FINDINGS: Cardiovascular: Large saddle embolus is noted which extends into lower lobe branches of both pulmonary arteries. RV/LV ratio of 1.4 is noted suggesting right heart strain. No pericardial effusion. Coronary artery calcifications are noted. Mediastinum/Nodes: Small sliding-type hiatal hernia. No adenopathy is noted. Thyroid gland is unremarkable. Lungs/Pleura: Lungs are clear. No pleural effusion or pneumothorax. Upper Abdomen: No acute abnormality. Musculoskeletal: No chest wall abnormality. No acute or significant osseous findings. Review of the MIP images confirms the above findings. IMPRESSION: Large saddle type pulmonary embolus is noted which extends into lower lobe branches of both pulmonary arteries. Positive for acute PE with CT evidence of right heart strain (RV/LV Ratio = 1.4) consistent with at least submassive (intermediate risk) PE. The presence of right heart strain has been associated with an increased risk of morbidity and mortality.  Please refer to the "Code PE Focused" order set in EPIC. Critical Value/emergent results were called by telephone at the time of interpretation on 05/31/2023 at 10:39 am to provider [facility] , who verbally acknowledged these results. Small sliding-type hiatal hernia. Coronary artery calcifications are noted. Electronically Signed   By: Lupita Raider M.D.   On: 05/31/2023 10:39   DG Chest Port 1 View  Result Date: 05/31/2023 CLINICAL DATA:  Dyspnea EXAM: PORTABLE CHEST 1 VIEW COMPARISON:  None Available. FINDINGS: Normal heart size. Normal mediastinal contour. No pneumothorax. No pleural effusion. Lungs appear clear, with no acute consolidative airspace disease and no pulmonary edema. IMPRESSION: No active disease. Electronically Signed   By: Delbert Phenix M.D.   On: 05/31/2023 08:53    Procedures  Procedures    Medications Ordered in ED Medications  heparin ADULT infusion 100 units/mL (25000 units/234mL) (has no administration in time range)  heparin bolus via infusion 4,000 Units (has no administration in time range)  iohexol (OMNIPAQUE) 350 MG/ML injection 75 mL (75 mLs Intravenous Contrast Given 05/31/23 1025)    ED Course/ Medical Decision Making/ A&P                             Medical Decision Making Amount and/or Complexity of Data Reviewed Labs: ordered. Radiology: ordered.  Risk Prescription drug management. Decision regarding hospitalization.   This patient presents to the ED for concern of sob, this involves an extensive number of treatment options, and is a complaint that carries with it a high risk of complications and morbidity.  The differential diagnosis includes pna, covid, cardiac event, chf   Co morbidities that complicate the patient evaluation  gerd, cva, hld, htn, and sleep apnea   Additional history obtained:  Additional history obtained from epic chart review External records from outside source obtained and reviewed including EMS report   Lab  Tests:  I Ordered, and personally interpreted labs.  The pertinent results include:  cbc nl, bmp nl other than cr elevated at 1.25, bnp 271.8, trop elevated at 102   Imaging Studies ordered:  I ordered imaging studies including cxr and ct chest I independently visualized and interpreted imaging which showed  CXR: No active disease.  CT chest: Large saddle type pulmonary embolus is noted which extends into  lower lobe branches of both pulmonary arteries. Positive for acute  PE with CT evidence of right heart strain (RV/LV Ratio = 1.4)  consistent with at least submassive (intermediate risk) PE. The  presence of right heart strain has been associated with an increased  risk of morbidity and mortality. Please refer to the "Code PE  Focused" order set in EPIC. Critical Value/emergent results were  called by telephone at the time of interpretation on 05/31/2023 at  10:39 am to provider [facility] , who verbally acknowledged  these results.    Small sliding-type hiatal hernia.    Coronary artery calcifications are noted.   I agree with the radiologist interpretation   Cardiac Monitoring:  The patient was maintained on a cardiac monitor.  I personally viewed and interpreted the cardiac monitored which showed an underlying rhythm of: nsr   Medicines ordered and prescription drug management:  I ordered medication including heparin  for PE  Reevaluation of the patient after these medicines showed that the patient improved I have reviewed the patients home medicines and have made adjustments as needed   Test Considered:  ct   Critical Interventions:  heparin   Consultations Obtained:  I requested consultation with the critical care docs,  and discussed lab and imaging findings as well as pertinent plan - they will see pt in the ED.  Dr. Katrinka Blazing said he spoke with IR who will do a mechanical thrombectomy.  After that, pt can go to the progressive unit with the hospitalists.   Pt d/w Dr. Ophelia Charter (triad) who will admit.   Problem List / ED Course:  Saddle Pulmonary Embolism with Heart strain:  heparin started. Pt is saturating in the mid-90s on 2L oxygen.  CCM consulted.   Reevaluation:  After the interventions noted above, I reevaluated the patient and found that they have :improved   Social Determinants of Health:  Lives at home  Dispostion:  After consideration of the diagnostic results and the patients response to treatment, I feel that the patent would benefit from admission.    CRITICAL CARE Performed by: Jacalyn Lefevre   Total critical care time: 30 minutes  Critical care time was exclusive of separately billable procedures and treating other patients.  Critical care was necessary to treat or prevent imminent or life-threatening deterioration.  Critical care was time spent personally by me on the following activities: development of treatment plan with patient and/or surrogate as well as nursing, discussions with consultants, evaluation of patient's response to treatment, examination of patient, obtaining history from patient or surrogate, ordering and performing treatments and interventions, ordering and review of laboratory studies, ordering and review of radiographic studies, pulse oximetry and re-evaluation of patient's condition.         Final Clinical Impression(s) / ED Diagnoses Final diagnoses:  Acute saddle pulmonary embolism with acute cor pulmonale (HCC)    Rx / DC Orders ED Discharge Orders     None         Jacalyn Lefevre, MD 05/31/23 1211

## 2023-05-31 NOTE — Consult Note (Addendum)
Chief Complaint: Patient was seen in consultation today for Bilateral PE thrombectomy Chief Complaint  Patient presents with   Chest Pain   Shortness of Breath   at the request of Dr Adaline Sill   Supervising Physician: Roanna Banning  Patient Status: [facility] - ED  History of Present Illness: Leah Knight is a 77 y.o. female    FULL Code status per pt  HTN; HLD; OSA Pt started feeling SOB yesterday; fatigue Worsened today to ED Denies pain; hemoptysis Nonsmoker Sits at desk for work  CTA: IMPRESSION: Large saddle type pulmonary embolus is noted which extends into lower lobe branches of both pulmonary arteries. Positive for acute PE with CT evidence of right heart strain (RV/LV Ratio = 1.4) consistent with at least submassive (intermediate risk) PE. The presence of right heart strain has been associated with an increased risk of morbidity and mortality.  Dr Katrinka Blazing note today: Unprovoked risk submassive pulmonary embolism-no contraindication to systemic lytic therapy.  However, in light of relatively recent retinal hemorrhage, would prefer if we could just do mechanical thrombectomy.  Discussed case with Dr. Milford Cage (IR) who will come evaluate the patient for this.  Lack of high HR likely from propranolol.  Only risk factor is sedentary job.   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  Authorizing Provider  atorvastatin (LIPITOR) 10 MG tablet Take 10 mg by mouth daily.   Yes [provider]  losartan (COZAAR) 50 MG tablet Take 50 mg by mouth daily.   Yes [provider]  omeprazole (PRILOSEC) 40 MG capsule Take 1 capsule (40 mg total) by mouth in the morning and at bedtime. Patient taking differently: Take 40 mg by mouth daily. 10/20/22  Yes Tressia Danas, MD  propranolol (INDERAL) 40 MG tablet Take 40 mg by mouth daily.   Yes [provider]  TURMERIC CURCUMIN PO Take 1 tablet by mouth daily.   Yes [provider]  VITAMIN E PO Take 1 capsule by mouth daily.   Yes [provider]     Family History  Problem Relation Age of Onset   Heart failure Mother    Hypertension Mother    Pneumonia Father    Diabetes Father    Hypertension Brother    Diabetes Brother    Colon cancer Neg Hx    Esophageal cancer Neg Hx    Rectal cancer Neg Hx    Stomach cancer Neg Hx     Social History   Socioeconomic History   Marital status: Widowed    Spouse name: Not on file   Number of children: 2   Years of education: Not on file   Highest education level: Not on file  Occupational History   Not on file  Tobacco Use   Smoking status: Never  Smokeless tobacco: Never  Substance and Sexual Activity   Alcohol use: Not Currently   Drug use: Not Currently   Sexual activity: Not on file  Other Topics Concern   Not on file  Social History Narrative   Lives alone    Right handed   Caffeine: 1-2 cups/day   Social Determinants of Health   Financial Resource Strain: Not on file  Food Insecurity: Not on file  Transportation Needs: Not on file  Physical Activity: Not on file  Stress: Not on file  Social Connections: Not on file    Review of Systems: A 12 point ROS discussed and pertinent positives are indicated in the HPI above.  All other systems are negative.  Review of Systems  Constitutional:  Positive for activity change  and fatigue.  Respiratory:  Positive for shortness of breath.   Cardiovascular:  Negative for chest pain and leg swelling.  Gastrointestinal:  Negative for nausea.  Musculoskeletal:  Negative for back pain.  Neurological:  Negative for weakness.  Psychiatric/Behavioral:  Negative for behavioral problems and confusion.     Vital Signs: BP 130/84   Pulse 88   Temp 98 F (36.7 C) (Oral)   Resp (!) 23   Ht 5\' 2"  (1.575 m)   Wt 163 lb (73.9 kg)   SpO2 95%   BMI 29.81 kg/m   Advance Care Plan: The advanced care plan/surrogate decision maker was discussed at the time of visit and documented in the medical record.    Physical Exam Vitals reviewed.  Cardiovascular:     Rate and Rhythm: Normal rate and regular rhythm.     Heart sounds: Normal heart sounds.  Pulmonary:     Breath sounds: Normal breath sounds.  Abdominal:     General: Bowel sounds are normal.     Palpations: Abdomen is soft.     Tenderness: There is no guarding or rebound.  Musculoskeletal:        General: Normal range of motion.     Right lower leg: No edema.     Left lower leg: No edema.  Skin:    General: Skin is warm.  Neurological:     Mental Status: She is alert and oriented to person, place, and time.  Psychiatric:        Behavior: Behavior normal.     Imaging: CT Angio Chest PE W and/or Wo Contrast  Result Date: 05/31/2023 CLINICAL DATA:  Shortness of breath. EXAM: CT ANGIOGRAPHY CHEST WITH CONTRAST TECHNIQUE: Multidetector CT imaging of the chest was performed using the standard protocol during bolus administration of intravenous contrast. Multiplanar CT image reconstructions and MIPs were obtained to evaluate the vascular anatomy. RADIATION DOSE REDUCTION: This exam was performed according to the departmental dose-optimization program which includes automated exposure control, adjustment of the mA and/or kV according to patient size and/or use of iterative reconstruction technique. CONTRAST:  75mL  OMNIPAQUE IOHEXOL 350 MG/ML SOLN COMPARISON:  Radiograph of same day. FINDINGS: Cardiovascular: Large saddle embolus is noted which extends into lower lobe branches of both pulmonary arteries. RV/LV ratio of 1.4 is noted suggesting right heart strain. No pericardial effusion. Coronary artery calcifications are noted. Mediastinum/Nodes: Small sliding-type hiatal hernia. No adenopathy is noted. Thyroid gland is unremarkable. Lungs/Pleura: Lungs are clear. No pleural effusion or pneumothorax. Upper Abdomen: No acute abnormality. Musculoskeletal: No chest wall abnormality. No acute or significant osseous findings. Review of the MIP images confirms the above findings. IMPRESSION: Large saddle type pulmonary embolus is noted which extends  into lower lobe branches of both pulmonary arteries. Positive for acute PE with CT evidence of right heart strain (RV/LV Ratio = 1.4) consistent with at least submassive (intermediate risk) PE. The presence of right heart strain has been associated with an increased risk of morbidity and mortality. Please refer to the "Code PE Focused" order set in EPIC. Critical Value/emergent results were called by telephone at the time of interpretation on 05/31/2023 at 10:39 am to provider [facility] , who verbally acknowledged these results. Small sliding-type hiatal hernia. Coronary artery calcifications are noted. Electronically Signed   By: Lupita Raider M.D.   On: 05/31/2023 10:39   DG Chest Port 1 View  Result Date: 05/31/2023 CLINICAL DATA:  Dyspnea EXAM: PORTABLE CHEST 1 VIEW COMPARISON:  None Available. FINDINGS: Normal heart size. Normal mediastinal contour. No pneumothorax. No pleural effusion. Lungs appear clear, with no acute consolidative airspace disease and no pulmonary edema. IMPRESSION: No active disease. Electronically Signed   By: Delbert Phenix M.D.   On: 05/31/2023 08:53    Labs:  CBC: Recent Labs    05/31/23 0816  WBC 8.4  HGB 13.9  HCT 41.8  PLT 179     COAGS: No results for input(s): "INR", "APTT" in the last 8760 hours.  BMP: Recent Labs    05/31/23 0816  NA 139  K 4.3  CL 108  CO2 21*  GLUCOSE 126*  BUN 17  CALCIUM 9.0  CREATININE 1.25*  GFRNONAA 45*    LIVER FUNCTION TESTS: No results for input(s): "BILITOT", "AST", "ALT", "ALKPHOS", "PROT", "ALBUMIN" in the last 8760 hours.  TUMOR MARKERS: No results for input(s): "AFPTM", "CEA", "CA199", "CHROMGRNA" in the last 8760 hours.  Assessment and Plan:  Scheduled for Pulmonary angiogram with Bilateral pulmonary embolus thrombectomy Risks and benefits of PE thrombectomy discussed with the patient including, but not limited to bleeding, pulmonary hemorrhage, perforation or dissection of cardiovascular structures, vascular injury, stroke, contrast induced renal failure, and infection. Consent signed and in chart  Thank you for this interesting consult.  I greatly enjoyed meeting Leah Knight and look forward to participating in their care.  A copy of this report was sent to the requesting provider on this date.  Electronically Signed: Robet Leu, PA-C 05/31/2023, 12:24 PM   I spent a total of 20 Minutes    in face to face in clinical consultation, greater than 50% of which was counseling/coordinating care for PE thrombectomy

## 2023-05-31 NOTE — Progress Notes (Signed)
Pt sated she has nasal pillows at home. Offered pt a nasal mask for CPAP, pt stated she doesn't want to wear a nasal mask. Pt not wearing CPAP for the night

## 2023-05-31 NOTE — Consult Note (Addendum)
NAME:  Leah Knight, MRN:  [id number], DOB:  Dec 04, 1946, LOS: 0 ADMISSION DATE:  05/31/2023, CONSULTATION DATE:  05/31/23 REFERRING MD:  Particia Nearing, CHIEF COMPLAINT:  SOB   History of Present Illness:  77 year old woman w/ hx of HTN, GERD, HLD, OSA p/w 2 days of fatigue and 1 day of severe SOB.  +dizziness, no syncope.  No pleurisy, no hemoptysis.  Imaging shows saddle PE and RV strain, cardiac enzymes c/w RV strain. PCCM consulted for management.  Did have a retinal hemorrhage spontaneous about 6 mo ago that self-resolved.  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  Otherwise no hx of surgeries, bleeding issues etc.  No family hx of blood clots.  No long car trips.  No constitutional symptoms.  She thinks her mammograms and colonoscopy are up to date.  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 rate (!) 23, height 5\' 2"  (1.575 m), weight 73.9 kg, SpO2 95 %.       No intake or output data in the 24 hours ending 05/31/23 1202 Filed Weights   05/31/23 1055  Weight: 73.9 kg    Examination: General: no distress HENT: malampatti 3, trachea midline Lungs: Clear to auscultation, no accessory muscle use Cardiovascular: Heart sounds regular, extremities warm Abdomen: Abdomen soft, positive bowel sounds Extremities: Do not see any cords, no edema, mild arthritic changes Neuro: Moves all 4 extremities to command GU: Deferred  All labs and imaging personally reviewed  Resolved Hospital  Problem list   Not applicable  Assessment & Plan:  Unprovoked risk submassive pulmonary embolism-no contraindication to systemic lytic therapy.  However, in light of relatively recent retinal hemorrhage, would prefer if we could just do mechanical thrombectomy.  Discussed case with Dr. Milford Cage (IR) who will come evaluate the patient for this.  Lack of high HR likely from propranolol.  Only risk factor is sedentary job.  - Heparin drip, mechanical thrombectomy, transition to Eliquis tomorrow - Depending on how she feels, could consider discharge as early as tomorrow morning - Would keep on bedrest until mechanical thrombectomy - Echocardiogram, if abnormal and lingering SOB could consider OP echo in 3 months or so - Recommend lifelong NoAC - Will check with [facility] tomorrow, if does well with procedure and feels okay PCCM will be available PRN  Best Practice (right click and "Reselect all SmartList Selections" daily)  Per primary  Labs   CBC: Recent Labs  Lab 05/31/23 0816  WBC 8.4  NEUTROABS 5.8  HGB 13.9  HCT 41.8  MCV 93.1  PLT 179    Basic Metabolic Panel: Recent Labs  Lab 05/31/23 0816  NA 139  K 4.3  CL 108  CO2 21*  GLUCOSE 126*  BUN 17  CREATININE 1.25*  CALCIUM 9.0   GFR: Estimated Creatinine Clearance: 36.6 mL/min (A) (by C-G formula based on SCr of 1.25 mg/dL (H)). Recent Labs  Lab 05/31/23  0816  WBC 8.4    Liver Function Tests: No results for input(s): "AST", "ALT", "ALKPHOS", "BILITOT", "PROT", "ALBUMIN" in the last 168 hours. No results for input(s): "LIPASE", "AMYLASE" in the last 168 hours. No results for input(s): "AMMONIA" in the last 168 hours.  ABG No results found for: "PHART", "PCO2ART", "PO2ART", "HCO3", "TCO2", "ACIDBASEDEF", "O2SAT"   Coagulation Profile: No results for input(s): "INR", "PROTIME" in the last 168 hours.  Cardiac Enzymes: No results for input(s): "CKTOTAL", "CKMB", "CKMBINDEX", "TROPONINI" in the last 168  hours.  HbA1C: No results found for: "HGBA1C"  CBG: No results for input(s): "GLUCAP" in the last 168 hours.  Review of Systems:    Positive Symptoms in bold:  Constitutional fevers, chills, weight loss, fatigue, anorexia, malaise  Eyes decreased vision, double vision, eye irritation  Ears, Nose, Mouth, Throat sore throat, trouble swallowing, sinus congestion  Cardiovascular chest pain, paroxysmal nocturnal dyspnea, lower ext edema, palpitations   Respiratory SOB, cough, DOE, hemoptysis, wheezing  Gastrointestinal nausea, vomiting, diarrhea  Genitourinary burning with urination, trouble urinating  Musculoskeletal joint aches, joint swelling, back pain  Integumentary  rashes, skin lesions  Neurological focal weakness, focal numbness, trouble speaking, headaches  Psychiatric depression, anxiety, confusion  Endocrine polyuria, polydipsia, cold intolerance, heat intolerance  Hematologic abnormal bruising, abnormal bleeding, unexplained nose bleeds  Allergic/Immunologic recurrent infections, hives, swollen lymph nodes     Past Medical History:  She,  has a past medical history of Allergy, Arthritis, GERD (gastroesophageal reflux disease), H/O: stroke (2000), retinal hemorrhage, Hyperlipemia, Hypertension, Sleep apnea, and Stroke (HCC).   Surgical History:   Past Surgical History:  Procedure Laterality Date   CATARACT EXTRACTION, BILATERAL  summer 2021   childbirth     x 2     Social History:   reports that she has never smoked. She has never used smokeless tobacco. She reports that she does not currently use alcohol. She reports that she does not currently use drugs.   Family History:  Her family history includes Diabetes in her brother and father; Heart failure in her mother; Hypertension in her brother and mother; Pneumonia in her father. There is no history of Colon cancer, Esophageal cancer, Rectal cancer, or Stomach cancer.   Allergies Allergies  Allergen Reactions    Sulfa Antibiotics Itching     Home Medications  Prior to Admission medications   Medication Sig Start Date End Date Taking? Authorizing Provider  atorvastatin (LIPITOR) 10 MG tablet Take 10 mg by mouth daily.   Yes [provider]  losartan (COZAAR) 50 MG tablet Take 50 mg by mouth daily.   Yes [provider]  omeprazole (PRILOSEC) 40 MG capsule Take 1 capsule (40 mg total) by mouth in the morning and at bedtime. Patient taking differently: Take 40 mg by mouth daily. 10/20/22  Yes Tressia Danas, MD  propranolol (INDERAL) 40 MG tablet Take 40 mg by mouth daily.   Yes [provider]  TURMERIC CURCUMIN PO Take 1 tablet by mouth daily.   Yes [provider]  VITAMIN E PO Take 1 capsule by mouth daily.   Yes [provider]     Critical care time: N/A

## 2023-05-31 NOTE — Progress Notes (Signed)
ANTICOAGULATION CONSULT NOTE - Initial Consult  Pharmacy Consult for Heparin infusion Indication: pulmonary embolus  Allergies  Allergen Reactions   Sulfa Antibiotics     Patient Measurements: Height: 5\' 2"  (157.5 cm) Weight: 73.9 kg (163 lb) IBW/kg (Calculated) : 50.1 Heparin Dosing Weight: 66 kg  Vital Signs: Temp: 98 F (36.7 C) (06/25 0817) Temp Source: Oral (06/25 0817) BP: 130/84 (06/25 1045) Pulse Rate: 88 (06/25 1045)  Labs: Recent Labs    05/31/23 0816  HGB 13.9  HCT 41.8  PLT 179  CREATININE 1.25*  TROPONINIHS 102*    Estimated Creatinine Clearance: 36.6 mL/min (A) (by C-G formula based on SCr of 1.25 mg/dL (H)).   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 Patient not taking any anticoagulation prior to admission. Pharmacy consulted to dose heparin infusion for acute PE.   Hgb 13.9, Plt 179  No s/sx of bleeding  Goal of Therapy:  Heparin level 0.3-0.7 units/ml Monitor platelets by anticoagulation protocol: Yes   Plan:  Give 4000 unit IV heparin bolus, followed by  Heparin infusion at 1100 units/hr Check heparin level in 8 hours Monitor daily CBC, heparin level, and for s/sx of monitoring   Wilburn Cornelia, PharmD, BCPS Clinical Pharmacist 05/31/2023 11:10 AM   Please refer to AMION for pharmacy phone number;a

## 2023-05-31 NOTE — Progress Notes (Signed)
ANTICOAGULATION CONSULT NOTE - Follow Up Consult  Pharmacy Consult for Heparin infusion Indication: pulmonary embolus  Allergies  Allergen Reactions   Sulfa Antibiotics Itching    Patient Measurements: Height: 5\' 2"  (157.5 cm) Weight: 73.9 kg (163 lb) IBW/kg (Calculated) : 50.1 Heparin Dosing Weight: 66 kg  Vital Signs: Temp: 97.6 F (36.4 C) (06/25 1622) Temp Source: Axillary (06/25 1622) BP: 158/88 (06/25 1622) Pulse Rate: 80 (06/25 1622)  Labs: Recent Labs    05/31/23 0816 05/31/23 1026 05/31/23 1901  HGB 13.9  --   --   HCT 41.8  --   --   PLT 179  --   --   LABPROT 14.2  --   --   INR 1.1  --   --   HEPARINUNFRC  --   --  >1.10*  CREATININE 1.25*  --   --   TROPONINIHS 102* 152*  --      Estimated Creatinine Clearance: 36.6 mL/min (A) (by C-G formula based on SCr of 1.25 mg/dL (H)).   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   05/30/2023   losartan (COZAAR) 50 MG tablet Take 50 mg by mouth daily.   05/30/2023   omeprazole (PRILOSEC) 40 MG capsule Take 1 capsule (40 mg total) by mouth in the morning and at bedtime. (Patient taking differently: Take 40 mg by mouth daily.) 180 capsule 3 05/30/2023   propranolol (INDERAL) 40 MG tablet Take 40 mg by mouth daily.   05/30/2023 at 0600   TURMERIC CURCUMIN PO Take 1 tablet by mouth daily.   05/30/2023   VITAMIN E PO Take 1 capsule by mouth daily.    05/30/2023    Assessment: 77 yo F presents with chest pain and SOB and found to have a large saddle PE extending to lower lobe branches of both pulmonary arteries with evidence of R heart strain. Patient not taking any anticoagulation prior to admission. Pharmacy consulted to dose heparin infusion for acute PE.   Patient is now s/p mechanical thrombectomy. Heparin level supratherapeutic at >1.1. Confirmed with RN that level was drawn from opposite arm in which heparin is infusing. She was given an additional 5000 units heparin in IR which is likely contributing. No bleeding reported per d/w RN.  Goal of Therapy:  Heparin level 0.3-0.7 units/ml Monitor platelets by anticoagulation protocol: Yes   Plan:  Hold heparin drip x 30 minutes Resume at reduced infusion rate of 1000 units/hr Check heparin level in 8 hours Monitor daily CBC, heparin level, and for s/sx of monitoring   Loralee Pacas, PharmD, BCPS 05/31/2023 8:40 PM   Please check AMION for all [facility] Pharmacy phone numbers After 10:00 PM, call Main Pharmacy [phone]

## 2023-05-31 NOTE — H&P (Signed)
History and Physical    Patient: Leah Knight QIO:[id number] DOB: 02-16-1946 DOA: 05/31/2023 DOS: the patient was seen and examined on 05/31/2023 PCP: Gaspar Garbe, MD  Patient coming from: Home - lives alone; NOK: Daughter, [phone]   Chief Complaint: chest pain/SOB  HPI: Leah ARENSON is a 77 y.o. female with medical history significant of HTN, HLD, OSA, and CVA presenting with CP/SOB. She got up this AM to get ready for work and noticed diaphoresis, legs weak and wobbly, hard to breathe.  She had to sit down to take a shower.  She says that yesterday was normal but she was quite SOB and tired/weak when walking out.  She has had some SOB, thought it was related to weight, maybe for 2-3 weeks.  No LE symptoms.  She had an inflammation in her eye in the winter, resolved with steroid.  No prolonged travel.  No other recent health changes.    ER Course:  Saddle embolus with R heart strain.  PCCM consulted, IR will do mechanical thrombectomy and can go to progressive care post-procedure.     Review of Systems: As mentioned in the history of present illness. All other systems reviewed and are negative. 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 She has never used smokeless tobacco. She reports that she does not currently use alcohol. She reports that she does not currently use drugs.  Allergies  Allergen Reactions   Sulfa Antibiotics Itching    Family History  Problem Relation Age of Onset   Heart failure Mother    Hypertension Mother    Pneumonia Father    Diabetes Father    Hypertension Brother     Diabetes Brother    Colon cancer Neg Hx    Esophageal cancer Neg Hx    Rectal cancer Neg Hx    Stomach cancer Neg Hx     Prior to Admission medications   Medication Sig Start Date End Date Taking? Authorizing Provider  atorvastatin (LIPITOR) 10 MG tablet Take 10 mg by mouth daily.   Yes [provider]  losartan (COZAAR) 50 MG tablet Take 50 mg by mouth daily.   Yes [provider]  omeprazole (PRILOSEC) 40 MG capsule Take 1 capsule (40 mg total) by mouth in the morning and at bedtime. Patient taking differently: Take 40 mg by mouth daily. 10/20/22  Yes Tressia Danas, MD  propranolol (INDERAL) 40 MG tablet Take 40 mg by mouth daily.   Yes [provider]  TURMERIC CURCUMIN PO Take 1 tablet by mouth daily.   Yes [provider]  VITAMIN E PO Take 1 capsule by mouth daily.   Yes [provider]    Physical Exam: Vitals:   05/31/23 1000 05/31/23 1045 05/31/23 1055 05/31/23 1213  BP: (!) 144/97 130/84    Pulse: 87 88    Resp: (!) 21 (!) 23    Temp:    98 F (36.7 C)  TempSrc:    Oral  SpO2: 95%  95%    Weight:   73.9 kg   Height:   5\' 2"  (1.575 m)    General:  Appears calm and comfortable and is in NAD, on  O2 Eyes:  PERRL, EOMI, normal lids, iris ENT:  grossly normal hearing, lips & tongue, mmm Neck:  no LAD, masses or thyromegaly Cardiovascular:  RRR, no m/r/g. No LE edema.  Respiratory:   CTA bilaterally with no wheezes/rales/rhonchi.  Mildly increased respiratory effort. Abdomen:  soft, NT, ND Skin:  no rash or induration seen on limited exam Musculoskeletal:  grossly normal tone BUE/BLE, good ROM, no bony abnormality; negative Homan's, squeeze testing Psychiatric:  blunted mood and affect, speech fluent and appropriate, AOx3 Neurologic:  CN 2-12 grossly intact, moves all extremities in coordinated fashion   Radiological Exams on Admission: Independently reviewed - see discussion in A/P where applicable  CT Angio  Chest PE W and/or Wo Contrast  Result Date: 05/31/2023 CLINICAL DATA:  Shortness of breath. EXAM: CT ANGIOGRAPHY CHEST WITH CONTRAST TECHNIQUE: Multidetector CT imaging of the chest was performed using the standard protocol during bolus administration of intravenous contrast. Multiplanar CT image reconstructions and MIPs were obtained to evaluate the vascular anatomy. RADIATION DOSE REDUCTION: This exam was performed according to the departmental dose-optimization program which includes automated exposure control, adjustment of the mA and/or kV according to patient size and/or use of iterative reconstruction technique. CONTRAST:  75mL OMNIPAQUE IOHEXOL 350 MG/ML SOLN COMPARISON:  Radiograph of same day. FINDINGS: Cardiovascular: Large saddle embolus is noted which extends into lower lobe branches of both pulmonary arteries. RV/LV ratio of 1.4 is noted suggesting right heart strain. No pericardial effusion. Coronary artery calcifications are noted. Mediastinum/Nodes: Small sliding-type hiatal hernia. No adenopathy is noted. Thyroid gland is unremarkable. Lungs/Pleura: Lungs are clear. No pleural effusion or pneumothorax. Upper Abdomen: No acute abnormality. Musculoskeletal: No chest wall abnormality. No acute or significant osseous findings. Review of the MIP images confirms the above findings. IMPRESSION: Large saddle type pulmonary embolus is noted which extends into lower lobe branches of both pulmonary arteries. Positive for acute PE with CT evidence of right heart strain (RV/LV Ratio = 1.4) consistent with at least submassive (intermediate risk) PE. The presence of right heart strain has been associated with an increased risk of morbidity and mortality. Please refer to the "Code PE Focused" order set in EPIC. Critical Value/emergent results were called by telephone at the time of interpretation on 05/31/2023 at 10:39 am to provider [facility] , who verbally acknowledged these results. Small sliding-type  hiatal hernia. Coronary artery calcifications are noted. Electronically Signed   By: Lupita Raider M.D.   On: 05/31/2023 10:39   DG Chest Port 1 View  Result Date: 05/31/2023 CLINICAL DATA:  Dyspnea EXAM: PORTABLE CHEST 1 VIEW COMPARISON:  None Available. FINDINGS: Normal heart size. Normal mediastinal contour. No pneumothorax. No pleural effusion. Lungs appear clear, with no acute consolidative airspace disease and no pulmonary edema. IMPRESSION: No active disease. Electronically Signed   By: Delbert Phenix M.D.   On: 05/31/2023 08:53    EKG: Independently reviewed.  NSR with rate 94; nonspecific ST changes with no evidence of acute ischemia   Labs on Admission: I have personally reviewed the available labs and imaging studies at the time of the admission.  Pertinent labs:    Glucose 126 BUN 17/Creatinine 1.25/GFR 45; 16/0.92 in 2016 BNP 271.8 HS troponin 102, 152 Normal CBC COVID negative   Assessment and Plan: Principal Problem:   Acute saddle  pulmonary embolism with acute cor pulmonale (HCC) Active Problems:   OSA on CPAP   Essential hypertension   Dyslipidemia   Chronic kidney disease, stage 3a (HCC)    Saddle PE with acute cor pulmonale -Patient without prior episodes of thromboembolic disease presenting with new saddle PE with R heart strain, at least submassive -Patient is not showing evidence of hemodynamic instability at this time -She is at risk for hemodynamic compromise and systemic thrombolysis should be considered - IR is planning to do a mechanical thrombectomy after consultation with PCCM -Given her hemodynamic stability, his is at intermediate risk -PESI score is Class III, intermediate risk, indicating a 3.2-7.1% 30-day mortality risk -S-PESI score is high, indicating that the patient has an 8.9% risk of death and a 1.5% risk of recurrent VTE or non-fatal bleeding -Will admit on telemetry to progressive care unit -R heart strain was seen on CT -Troponin and  BNP as well as echo have been ordered to assess the severity of clot burden with R heart strain -With high PESI/S-PESI and both R heart strain AND elevated biomarkers, she is at high intermediate risk and IR should be consulted for consideration of intervention; this does not improve mortality risk but does lower the risk of pulmonary HTN and is associated with lower O2 requirement and a faster time to discharge -Initiate anticoagulation - for now, will start treatment-dose heparin with plan to transition to alternative AC agent once more stable -[place] O2 as needed -We discussed oral AC treatment options and risks/benefits including frequent MD visits and dietary regulation with Coumadin vs. Significant expense with DOAC therapy -Will request TOC team consultation to assist with cost analysis of the various DOAC options based on his insurance -Patients are at intermediate risk (3-8%/year) for recurrent VTE if initial clot occurred with no identifiable RF.  Extended oral anticoagulation of indefinite duration should be considered for patients with a first episode of PE with these issues. -The patient understands that thromboembolic disease can be catastrophic and even deadly and that he must be complaint with physician appointments and anticoagulation. -DVT US ordered; while the legs are normal in appearance and non-painful, if the patient does have an underlying DVT then an IVC filter might need to be considered - particularly if the patient is unable to tolerate OAC therapy   HTN -Continue propranolol -Hold losartan for now  HLD -Continue aotrvastatin  OSA -Continue CPAP  Stage 3a CKD -Uncertain baseline but she was at the upper limit of normal in 2016 so suspect CKD rather than AKI -Attempt to avoid nephrotoxic medications -Recheck BMP in AM    Advance Care Planning:   Code Status: Full Code - Code status was discussed with the patient and friend at the time of admission.  The patient would  want to receive full resuscitative measures at this time.   Consults: PCCM; IR; TOC team  DVT Prophylaxis: Heparin infusion  Family Communication: Coworker was present throughout evaluation  Severity of Illness: The appropriate patient status for this patient is INPATIENT. Inpatient status is judged to be reasonable and necessary in order to provide the required intensity of service to ensure the patient's safety. The patient's presenting symptoms, physical exam findings, and initial radiographic and laboratory data in the context of their chronic comorbidities is felt to place them at high risk for further clinical deterioration. Furthermore, it is not anticipated that the patient will be medically stable for discharge from the hospital within 2 midnights of admission.   *  I certify that at the point of admission it is my clinical judgment that the patient will require inpatient hospital care spanning beyond 2 midnights from the point of admission due to high intensity of service, high risk for further deterioration and high frequency of surveillance required.*  Author: Jonah Blue, MD 05/31/2023 1:41 PM  For on call review [email or web address].

## 2023-05-31 NOTE — ED Triage Notes (Addendum)
Pt BIB [facility] from home for c/o chest pain and SHOB. Pt woke up this AM and felt fatigued and weak with SHOB and chest discomfort/tightness. Upon EMS arrival pt was anxious and hyperventilating. Pt was able to calm down and walk to stretcher and again felt more chest tightness. Also reports feeling tightness in her chest earlier this week when she walked to the mailbox. 324 aspirin given PTA  BP 170/90 97% room air

## 2023-06-01 ENCOUNTER — Inpatient Hospital Stay (HOSPITAL_COMMUNITY): Payer: Medicare Other

## 2023-06-01 ENCOUNTER — Other Ambulatory Visit (HOSPITAL_COMMUNITY): Payer: Self-pay

## 2023-06-01 DIAGNOSIS — I2602 Saddle embolus of pulmonary artery with acute cor pulmonale: Secondary | ICD-10-CM

## 2023-06-01 DIAGNOSIS — E785 Hyperlipidemia, unspecified: Secondary | ICD-10-CM

## 2023-06-01 DIAGNOSIS — I10 Essential (primary) hypertension: Secondary | ICD-10-CM | POA: Diagnosis not present

## 2023-06-01 DIAGNOSIS — G4733 Obstructive sleep apnea (adult) (pediatric): Secondary | ICD-10-CM

## 2023-06-01 DIAGNOSIS — Z86711 Personal history of pulmonary embolism: Secondary | ICD-10-CM | POA: Diagnosis not present

## 2023-06-01 LAB — BASIC METABOLIC PANEL
Anion gap: 13 (ref 5–15)
BUN: 11 mg/dL (ref 8–23)
CO2: 22 mmol/L (ref 22–32)
Calcium: 8.4 mg/dL — ABNORMAL LOW (ref 8.9–10.3)
Chloride: 105 mmol/L (ref 98–111)
Creatinine, Ser: 1 mg/dL (ref 0.44–1.00)
GFR, Estimated: 59 mL/min — ABNORMAL LOW (ref >60)
Glucose, Bld: 125 mg/dL — ABNORMAL HIGH (ref 70–99)
Potassium: 3.6 mmol/L (ref 3.5–5.1)
Sodium: 140 mmol/L (ref 135–145)

## 2023-06-01 LAB — CBC
HCT: 33 % — ABNORMAL LOW (ref 36.0–46.0)
Hemoglobin: 11.2 g/dL — ABNORMAL LOW (ref 12.0–15.0)
MCH: 31.5 pg (ref 26.0–34.0)
MCHC: 33.9 g/dL (ref 30.0–36.0)
MCV: 93 fL (ref 80.0–100.0)
Platelets: 148 10*3/uL — ABNORMAL LOW (ref 150–400)
RBC: 3.55 MIL/uL — ABNORMAL LOW (ref 3.87–5.11)
RDW: 13.6 % (ref 11.5–15.5)
WBC: 7.8 10*3/uL (ref 4.0–10.5)
nRBC: 0 % (ref 0.0–0.2)

## 2023-06-01 LAB — ECHOCARDIOGRAM LIMITED
Area-P 1/2: 2.36 cm2
Height: 62 in
S' Lateral: 0.9 cm
Weight: 2608 oz

## 2023-06-01 LAB — HEPARIN LEVEL (UNFRACTIONATED): Heparin Unfractionated: 1.1 IU/mL — ABNORMAL HIGH (ref 0.30–0.70)

## 2023-06-01 MED ORDER — HEPARIN (PORCINE) 25000 UT/250ML-% IV SOLN
850.0000 [IU]/h | INTRAVENOUS | Status: DC
  Administered 2023-06-01: 850 [IU]/h via INTRAVENOUS
  Filled 2023-06-01: qty 250

## 2023-06-01 MED ORDER — APIXABAN 5 MG PO TABS
10.0000 mg | ORAL_TABLET | Freq: Two times a day (BID) | ORAL | Status: DC
  Administered 2023-06-01 – 2023-06-02 (×3): 10 mg via ORAL
  Filled 2023-06-01 (×3): qty 2

## 2023-06-01 MED ORDER — APIXABAN 5 MG PO TABS: 5.0000 mg | ORAL_TABLET | Freq: Two times a day (BID) | ORAL | Status: DC

## 2023-06-01 MED ORDER — PERFLUTREN LIPID MICROSPHERE
1.0000 mL | INTRAVENOUS | Status: AC | PRN
  Administered 2023-06-01: 2 mL via INTRAVENOUS

## 2023-06-01 NOTE — Progress Notes (Signed)
Referring Physician(s): Levon Hedger  Supervising Physician: Oley Balm  Patient Status:  [facility] - In-pt  Chief Complaint:  Unprovoked bilateral PE, recent retinal hemorrhage  S/p thrombectomy by Dr. Milford Cage on 6/25   Subjective:  Pt sitting in a recliner, NAD. Daughter and visitor at bedside.  States that her breathing is much better today, no chest pain.  Anticipating d/c tomorrow.   Allergies: Sulfa antibiotics  Medications: Prior to Admission medications   Medication Sig Start Date End Date Taking? Authorizing Provider  atorvastatin (LIPITOR) 10 MG tablet Take 10 mg by mouth daily.   Yes [provider]  losartan (COZAAR) 50 MG tablet Take 50 mg by mouth daily.   Yes [provider]  omeprazole (PRILOSEC) 40 MG capsule Take 1 capsule (40 mg total) by mouth in the morning and at bedtime. Patient taking differently: Take 40 mg by mouth daily. 10/20/22  Yes Tressia Danas, MD  propranolol (INDERAL) 40 MG tablet Take 40 mg by mouth daily.   Yes [provider]  TURMERIC CURCUMIN PO Take 1 tablet by mouth daily.   Yes [provider]  VITAMIN E PO Take 1 capsule by mouth daily.   Yes [provider]     Vital Signs: BP (!) 145/81 (BP Location: Left Arm)   Pulse 78   Temp 98 F (36.7 C) (Oral)   Resp 19   Ht 5\' 2"  (1.575 m)   Wt 163 lb (73.9 kg)   SpO2 95%   BMI 29.81 kg/m   Physical Exam Vitals reviewed.  Constitutional:      General: She is not in acute distress.    Appearance: She is not ill-appearing.  Pulmonary:     Effort: Pulmonary effort is normal.  Skin:    General: Skin is warm and dry.     Findings: Ecchymosis present.     Comments: Ecchymosis in pubic area, area is soft and non tender. Suture at right groin puncture site.   Neurological:     Mental Status: She is alert and oriented to person, place, and time.  Psychiatric:        Mood and Affect: Mood normal.        Behavior: Behavior  normal.     Imaging: IR US Guide Vasc Access Right  Result Date: 05/31/2023 INDICATION: Submassive PE. Briefly, 77 year old female presenting with severe shortness of breath. ER CTA PE demonstrating saddle embolus, submassive PE. RV/LV ratio 1.4. EXAM: Procedures: 1. PULMONARY ARTERIOGRAPHY 2. CATHETER-DIRECTED MECHANICAL THROMBECTOMY COMPARISON:  Chest XR and CTA PE, 12/30/2022. MEDICATIONS: 5K IU heparin IV. ANESTHESIA/SEDATION: Moderate (conscious) sedation was employed during this procedure. A total of Versed 2 mg and Fentanyl 25 mcg was administered intravenously. Moderate Sedation Time: 75 minutes. The patient's level of consciousness and vital signs were monitored continuously by radiology nursing throughout the procedure under my direct supervision. CONTRAST:  50mL OMNIPAQUE IOHEXOL 300 MG/ML  SOLN FLUOROSCOPY TIME:  Fluoroscopic dose; 94 mGy COMPLICATIONS: None immediate. TECHNIQUE: Informed written consent was obtained from the patient and/or patient's representative after a discussion of the risks, benefits and alternatives to treatment. Questions regarding the procedure were encouraged and answered. A timeout was performed prior to the initiation of the procedure. Ultrasound scanning was performed of the RIGHT groin and demonstrated patency of the RIGHT common femoral and greater saphenous veins. The RIGHT groin was prepped and draped in the usual sterile fashion, and a sterile drape was applied covering the operative field. Maximum barrier sterile technique with  sterile gowns and gloves were used for the procedure. A timeout was performed prior to the initiation of the procedure. Local anesthesia was provided with 1% lidocaine. Under direct ultrasound guidance, the RIGHT greater saphenous vein was accessed with a micro puncture kit ultimately allowing placement of a 7 Fr vascular sheath. Ultrasound and fluoroscopic spot images were saved for procedural documentation purposes. With the use of a  0.035 inch Bentson wire and an angled pigtail pulmonary catheter, access past the RIGHT heart and into the main pulmonary artery was obtained. Pre intervention central pulmonary arteriogram was performed. The Bentson wire was exchanged for an Amplatz wire then a 16 Fr, 33 cm GORE dry seal sheath then a 16 Fr Penumbra Lightning Flash aspiration catheter was advanced into the RIGHT then LEFT pulmonary arteries and catheter directed thrombectomy was performed. Intermittent and postprocedural pulmonary arteriograms were performed during thrombus removal. Following adequate angiographic result and the catheter and sheath were then removed, and hemostasis was obtained by manual pressure. The patient tolerated the procedure well without immediate postprocedural complication. FINDINGS: *Central pulmonary arteriogram demonstrates saddle embolus and additional burden predominantly at the pulmonary arterial bifurcations, bilateral inferior lobar and the RIGHT superior lobar pulmonary artery. *Post interventional pulmonary arteriograms demonstrating adequate debulking thrombectomy from the bilateral inferior lobar pulmonary arteries. *Mild residual thrombus burden. IMPRESSION: Successful catheter-directed bilateral pulmonary arterial debulking mechanical thrombectomy. PLAN: 1. Continue systemic anticoagulation with heparin gtt and transition to DOAC when clinically feasible. 2. The patient will be seen by me in the Vascular Interventional Radiology (VIR) clinic for postoperative follow-up, and including repeat BILATERAL lower extremity venous duplex in 1 month. Roanna Banning, MD [facility] Radiology Electronically Signed   By: Roanna Banning M.D.   On: 05/31/2023 16:29   IR THROMBECT PRIM MECH INIT (INCLU) MOD SED  Result Date: 05/31/2023 INDICATION: Submassive PE. Briefly, 77 year old female presenting with severe shortness of breath. ER CTA PE demonstrating saddle embolus,  submassive PE. RV/LV ratio 1.4. EXAM: Procedures: 1. PULMONARY ARTERIOGRAPHY 2. CATHETER-DIRECTED MECHANICAL THROMBECTOMY COMPARISON:  Chest XR and CTA PE, 12/30/2022. MEDICATIONS: 5K IU heparin IV. ANESTHESIA/SEDATION: Moderate (conscious) sedation was employed during this procedure. A total of Versed 2 mg and Fentanyl 25 mcg was administered intravenously. Moderate Sedation Time: 75 minutes. The patient's level of consciousness and vital signs were monitored continuously by radiology nursing throughout the procedure under my direct supervision. CONTRAST:  50mL OMNIPAQUE IOHEXOL 300 MG/ML  SOLN FLUOROSCOPY TIME:  Fluoroscopic dose; 94 mGy COMPLICATIONS: None immediate. TECHNIQUE: Informed written consent was obtained from the patient and/or patient's representative after a discussion of the risks, benefits and alternatives to treatment. Questions regarding the procedure were encouraged and answered. A timeout was performed prior to the initiation of the procedure. Ultrasound scanning was performed of the RIGHT groin and demonstrated patency of the RIGHT common femoral and greater saphenous veins. The RIGHT groin was prepped and draped in the usual sterile fashion, and a sterile drape was applied covering the operative field. Maximum barrier sterile technique with sterile gowns and gloves were used for the procedure. A timeout was performed prior to the initiation of the procedure. Local anesthesia was provided with 1% lidocaine. Under direct ultrasound guidance, the RIGHT greater saphenous vein was accessed with a micro puncture kit ultimately allowing placement of a 7 Fr vascular sheath. Ultrasound and fluoroscopic spot images were saved for procedural documentation purposes. With the use of a 0.035 inch Bentson wire and an angled pigtail pulmonary catheter, access past the  RIGHT heart and into the main pulmonary artery was obtained. Pre intervention central pulmonary arteriogram was performed. The Bentson wire  was exchanged for an Amplatz wire then a 16 Fr, 33 cm GORE dry seal sheath then a 16 Fr Penumbra Lightning Flash aspiration catheter was advanced into the RIGHT then LEFT pulmonary arteries and catheter directed thrombectomy was performed. Intermittent and postprocedural pulmonary arteriograms were performed during thrombus removal. Following adequate angiographic result and the catheter and sheath were then removed, and hemostasis was obtained by manual pressure. The patient tolerated the procedure well without immediate postprocedural complication. FINDINGS: *Central pulmonary arteriogram demonstrates saddle embolus and additional burden predominantly at the pulmonary arterial bifurcations, bilateral inferior lobar and the RIGHT superior lobar pulmonary artery. *Post interventional pulmonary arteriograms demonstrating adequate debulking thrombectomy from the bilateral inferior lobar pulmonary arteries. *Mild residual thrombus burden. IMPRESSION: Successful catheter-directed bilateral pulmonary arterial debulking mechanical thrombectomy. PLAN: 1. Continue systemic anticoagulation with heparin gtt and transition to DOAC when clinically feasible. 2. The patient will be seen by me in the Vascular Interventional Radiology (VIR) clinic for postoperative follow-up, and including repeat BILATERAL lower extremity venous duplex in 1 month. Roanna Banning, MD [facility] Radiology Electronically Signed   By: Roanna Banning M.D.   On: 05/31/2023 16:29   IR THROMBECT PRIM MECH INIT (INCLU) MOD SED  Result Date: 05/31/2023 INDICATION: Submassive PE. Briefly, 77 year old female presenting with severe shortness of breath. ER CTA PE demonstrating saddle embolus, submassive PE. RV/LV ratio 1.4. EXAM: Procedures: 1. PULMONARY ARTERIOGRAPHY 2. CATHETER-DIRECTED MECHANICAL THROMBECTOMY COMPARISON:  Chest XR and CTA PE, 12/30/2022. MEDICATIONS: 5K IU heparin IV. ANESTHESIA/SEDATION:  Moderate (conscious) sedation was employed during this procedure. A total of Versed 2 mg and Fentanyl 25 mcg was administered intravenously. Moderate Sedation Time: 75 minutes. The patient's level of consciousness and vital signs were monitored continuously by radiology nursing throughout the procedure under my direct supervision. CONTRAST:  50mL OMNIPAQUE IOHEXOL 300 MG/ML  SOLN FLUOROSCOPY TIME:  Fluoroscopic dose; 94 mGy COMPLICATIONS: None immediate. TECHNIQUE: Informed written consent was obtained from the patient and/or patient's representative after a discussion of the risks, benefits and alternatives to treatment. Questions regarding the procedure were encouraged and answered. A timeout was performed prior to the initiation of the procedure. Ultrasound scanning was performed of the RIGHT groin and demonstrated patency of the RIGHT common femoral and greater saphenous veins. The RIGHT groin was prepped and draped in the usual sterile fashion, and a sterile drape was applied covering the operative field. Maximum barrier sterile technique with sterile gowns and gloves were used for the procedure. A timeout was performed prior to the initiation of the procedure. Local anesthesia was provided with 1% lidocaine. Under direct ultrasound guidance, the RIGHT greater saphenous vein was accessed with a micro puncture kit ultimately allowing placement of a 7 Fr vascular sheath. Ultrasound and fluoroscopic spot images were saved for procedural documentation purposes. With the use of a 0.035 inch Bentson wire and an angled pigtail pulmonary catheter, access past the RIGHT heart and into the main pulmonary artery was obtained. Pre intervention central pulmonary arteriogram was performed. The Bentson wire was exchanged for an Amplatz wire then a 16 Fr, 33 cm GORE dry seal sheath then a 16 Fr Penumbra Lightning Flash aspiration catheter was advanced into the RIGHT then LEFT pulmonary arteries and catheter directed  thrombectomy was performed. Intermittent and postprocedural pulmonary arteriograms were performed during thrombus removal. Following adequate angiographic result and the catheter and sheath were  then removed, and hemostasis was obtained by manual pressure. The patient tolerated the procedure well without immediate postprocedural complication. FINDINGS: *Central pulmonary arteriogram demonstrates saddle embolus and additional burden predominantly at the pulmonary arterial bifurcations, bilateral inferior lobar and the RIGHT superior lobar pulmonary artery. *Post interventional pulmonary arteriograms demonstrating adequate debulking thrombectomy from the bilateral inferior lobar pulmonary arteries. *Mild residual thrombus burden. IMPRESSION: Successful catheter-directed bilateral pulmonary arterial debulking mechanical thrombectomy. PLAN: 1. Continue systemic anticoagulation with heparin gtt and transition to DOAC when clinically feasible. 2. The patient will be seen by me in the Vascular Interventional Radiology (VIR) clinic for postoperative follow-up, and including repeat BILATERAL lower extremity venous duplex in 1 month. Roanna Banning, MD [facility] Radiology Electronically Signed   By: Roanna Banning M.D.   On: 05/31/2023 16:29   IR Angiogram Pulmonary Bilateral Selective  Result Date: 05/31/2023 INDICATION: Submassive PE. Briefly, 77 year old female presenting with severe shortness of breath. ER CTA PE demonstrating saddle embolus, submassive PE. RV/LV ratio 1.4. EXAM: Procedures: 1. PULMONARY ARTERIOGRAPHY 2. CATHETER-DIRECTED MECHANICAL THROMBECTOMY COMPARISON:  Chest XR and CTA PE, 12/30/2022. MEDICATIONS: 5K IU heparin IV. ANESTHESIA/SEDATION: Moderate (conscious) sedation was employed during this procedure. A total of Versed 2 mg and Fentanyl 25 mcg was administered intravenously. Moderate Sedation Time: 75 minutes. The patient's level of consciousness and  vital signs were monitored continuously by radiology nursing throughout the procedure under my direct supervision. CONTRAST:  50mL OMNIPAQUE IOHEXOL 300 MG/ML  SOLN FLUOROSCOPY TIME:  Fluoroscopic dose; 94 mGy COMPLICATIONS: None immediate. TECHNIQUE: Informed written consent was obtained from the patient and/or patient's representative after a discussion of the risks, benefits and alternatives to treatment. Questions regarding the procedure were encouraged and answered. A timeout was performed prior to the initiation of the procedure. Ultrasound scanning was performed of the RIGHT groin and demonstrated patency of the RIGHT common femoral and greater saphenous veins. The RIGHT groin was prepped and draped in the usual sterile fashion, and a sterile drape was applied covering the operative field. Maximum barrier sterile technique with sterile gowns and gloves were used for the procedure. A timeout was performed prior to the initiation of the procedure. Local anesthesia was provided with 1% lidocaine. Under direct ultrasound guidance, the RIGHT greater saphenous vein was accessed with a micro puncture kit ultimately allowing placement of a 7 Fr vascular sheath. Ultrasound and fluoroscopic spot images were saved for procedural documentation purposes. With the use of a 0.035 inch Bentson wire and an angled pigtail pulmonary catheter, access past the RIGHT heart and into the main pulmonary artery was obtained. Pre intervention central pulmonary arteriogram was performed. The Bentson wire was exchanged for an Amplatz wire then a 16 Fr, 33 cm GORE dry seal sheath then a 16 Fr Penumbra Lightning Flash aspiration catheter was advanced into the RIGHT then LEFT pulmonary arteries and catheter directed thrombectomy was performed. Intermittent and postprocedural pulmonary arteriograms were performed during thrombus removal. Following adequate angiographic result and the catheter and sheath were then removed, and hemostasis was  obtained by manual pressure. The patient tolerated the procedure well without immediate postprocedural complication. FINDINGS: *Central pulmonary arteriogram demonstrates saddle embolus and additional burden predominantly at the pulmonary arterial bifurcations, bilateral inferior lobar and the RIGHT superior lobar pulmonary artery. *Post interventional pulmonary arteriograms demonstrating adequate debulking thrombectomy from the bilateral inferior lobar pulmonary arteries. *Mild residual thrombus burden. IMPRESSION: Successful catheter-directed bilateral pulmonary arterial debulking mechanical thrombectomy. PLAN: 1. Continue systemic anticoagulation with heparin gtt and transition to  DOAC when clinically feasible. 2. The patient will be seen by me in the Vascular Interventional Radiology (VIR) clinic for postoperative follow-up, and including repeat BILATERAL lower extremity venous duplex in 1 month. Roanna Banning, MD [facility] Radiology Electronically Signed   By: Roanna Banning M.D.   On: 05/31/2023 16:29   IR Angiogram Selective Each Additional Vessel  Result Date: 05/31/2023 INDICATION: Submassive PE. Briefly, 77 year old female presenting with severe shortness of breath. ER CTA PE demonstrating saddle embolus, submassive PE. RV/LV ratio 1.4. EXAM: Procedures: 1. PULMONARY ARTERIOGRAPHY 2. CATHETER-DIRECTED MECHANICAL THROMBECTOMY COMPARISON:  Chest XR and CTA PE, 12/30/2022. MEDICATIONS: 5K IU heparin IV. ANESTHESIA/SEDATION: Moderate (conscious) sedation was employed during this procedure. A total of Versed 2 mg and Fentanyl 25 mcg was administered intravenously. Moderate Sedation Time: 75 minutes. The patient's level of consciousness and vital signs were monitored continuously by radiology nursing throughout the procedure under my direct supervision. CONTRAST:  50mL OMNIPAQUE IOHEXOL 300 MG/ML  SOLN FLUOROSCOPY TIME:  Fluoroscopic dose; 94 mGy COMPLICATIONS:  None immediate. TECHNIQUE: Informed written consent was obtained from the patient and/or patient's representative after a discussion of the risks, benefits and alternatives to treatment. Questions regarding the procedure were encouraged and answered. A timeout was performed prior to the initiation of the procedure. Ultrasound scanning was performed of the RIGHT groin and demonstrated patency of the RIGHT common femoral and greater saphenous veins. The RIGHT groin was prepped and draped in the usual sterile fashion, and a sterile drape was applied covering the operative field. Maximum barrier sterile technique with sterile gowns and gloves were used for the procedure. A timeout was performed prior to the initiation of the procedure. Local anesthesia was provided with 1% lidocaine. Under direct ultrasound guidance, the RIGHT greater saphenous vein was accessed with a micro puncture kit ultimately allowing placement of a 7 Fr vascular sheath. Ultrasound and fluoroscopic spot images were saved for procedural documentation purposes. With the use of a 0.035 inch Bentson wire and an angled pigtail pulmonary catheter, access past the RIGHT heart and into the main pulmonary artery was obtained. Pre intervention central pulmonary arteriogram was performed. The Bentson wire was exchanged for an Amplatz wire then a 16 Fr, 33 cm GORE dry seal sheath then a 16 Fr Penumbra Lightning Flash aspiration catheter was advanced into the RIGHT then LEFT pulmonary arteries and catheter directed thrombectomy was performed. Intermittent and postprocedural pulmonary arteriograms were performed during thrombus removal. Following adequate angiographic result and the catheter and sheath were then removed, and hemostasis was obtained by manual pressure. The patient tolerated the procedure well without immediate postprocedural complication. FINDINGS: *Central pulmonary arteriogram demonstrates saddle embolus and additional burden predominantly at  the pulmonary arterial bifurcations, bilateral inferior lobar and the RIGHT superior lobar pulmonary artery. *Post interventional pulmonary arteriograms demonstrating adequate debulking thrombectomy from the bilateral inferior lobar pulmonary arteries. *Mild residual thrombus burden. IMPRESSION: Successful catheter-directed bilateral pulmonary arterial debulking mechanical thrombectomy. PLAN: 1. Continue systemic anticoagulation with heparin gtt and transition to DOAC when clinically feasible. 2. The patient will be seen by me in the Vascular Interventional Radiology (VIR) clinic for postoperative follow-up, and including repeat BILATERAL lower extremity venous duplex in 1 month. Roanna Banning, MD [facility] Interventional Radiology Specialists [facility] Radiology Electronically Signed   By: Roanna Banning M.D.   On: 05/31/2023 16:29   IR Angiogram Selective Each Additional Vessel  Result Date: 05/31/2023 INDICATION: Submassive PE. Briefly, 77 year old female presenting with severe shortness of breath. ER CTA  PE demonstrating saddle embolus, submassive PE. RV/LV ratio 1.4. EXAM: Procedures: 1. PULMONARY ARTERIOGRAPHY 2. CATHETER-DIRECTED MECHANICAL THROMBECTOMY COMPARISON:  Chest XR and CTA PE, 12/30/2022. MEDICATIONS: 5K IU heparin IV. ANESTHESIA/SEDATION: Moderate (conscious) sedation was employed during this procedure. A total of Versed 2 mg and Fentanyl 25 mcg was administered intravenously. Moderate Sedation Time: 75 minutes. The patient's level of consciousness and vital signs were monitored continuously by radiology nursing throughout the procedure under my direct supervision. CONTRAST:  50mL OMNIPAQUE IOHEXOL 300 MG/ML  SOLN FLUOROSCOPY TIME:  Fluoroscopic dose; 94 mGy COMPLICATIONS: None immediate. TECHNIQUE: Informed written consent was obtained from the patient and/or patient's representative after a discussion of the risks, benefits and alternatives to treatment. Questions regarding the procedure were  encouraged and answered. A timeout was performed prior to the initiation of the procedure. Ultrasound scanning was performed of the RIGHT groin and demonstrated patency of the RIGHT common femoral and greater saphenous veins. The RIGHT groin was prepped and draped in the usual sterile fashion, and a sterile drape was applied covering the operative field. Maximum barrier sterile technique with sterile gowns and gloves were used for the procedure. A timeout was performed prior to the initiation of the procedure. Local anesthesia was provided with 1% lidocaine. Under direct ultrasound guidance, the RIGHT greater saphenous vein was accessed with a micro puncture kit ultimately allowing placement of a 7 Fr vascular sheath. Ultrasound and fluoroscopic spot images were saved for procedural documentation purposes. With the use of a 0.035 inch Bentson wire and an angled pigtail pulmonary catheter, access past the RIGHT heart and into the main pulmonary artery was obtained. Pre intervention central pulmonary arteriogram was performed. The Bentson wire was exchanged for an Amplatz wire then a 16 Fr, 33 cm GORE dry seal sheath then a 16 Fr Penumbra Lightning Flash aspiration catheter was advanced into the RIGHT then LEFT pulmonary arteries and catheter directed thrombectomy was performed. Intermittent and postprocedural pulmonary arteriograms were performed during thrombus removal. Following adequate angiographic result and the catheter and sheath were then removed, and hemostasis was obtained by manual pressure. The patient tolerated the procedure well without immediate postprocedural complication. FINDINGS: *Central pulmonary arteriogram demonstrates saddle embolus and additional burden predominantly at the pulmonary arterial bifurcations, bilateral inferior lobar and the RIGHT superior lobar pulmonary artery. *Post interventional pulmonary arteriograms demonstrating adequate debulking thrombectomy from the bilateral inferior  lobar pulmonary arteries. *Mild residual thrombus burden. IMPRESSION: Successful catheter-directed bilateral pulmonary arterial debulking mechanical thrombectomy. PLAN: 1. Continue systemic anticoagulation with heparin gtt and transition to DOAC when clinically feasible. 2. The patient will be seen by me in the Vascular Interventional Radiology (VIR) clinic for postoperative follow-up, and including repeat BILATERAL lower extremity venous duplex in 1 month. Roanna Banning, MD [facility] Interventional Radiology Specialists [facility] Radiology Electronically Signed   By: Roanna Banning M.D.   On: 05/31/2023 16:29   CT Angio Chest PE W and/or Wo Contrast  Result Date: 05/31/2023 CLINICAL DATA:  Shortness of breath. EXAM: CT ANGIOGRAPHY CHEST WITH CONTRAST TECHNIQUE: Multidetector CT imaging of the chest was performed using the standard protocol during bolus administration of intravenous contrast. Multiplanar CT image reconstructions and MIPs were obtained to evaluate the vascular anatomy. RADIATION DOSE REDUCTION: This exam was performed according to the departmental dose-optimization program which includes automated exposure control, adjustment of the mA and/or kV according to patient size and/or use of iterative reconstruction technique. CONTRAST:  75mL OMNIPAQUE IOHEXOL 350 MG/ML SOLN COMPARISON:  Radiograph of same day. FINDINGS: Cardiovascular: Large  saddle embolus is noted which extends into lower lobe branches of both pulmonary arteries. RV/LV ratio of 1.4 is noted suggesting right heart strain. No pericardial effusion. Coronary artery calcifications are noted. Mediastinum/Nodes: Small sliding-type hiatal hernia. No adenopathy is noted. Thyroid gland is unremarkable. Lungs/Pleura: Lungs are clear. No pleural effusion or pneumothorax. Upper Abdomen: No acute abnormality. Musculoskeletal: No chest wall abnormality. No acute or significant osseous findings. Review of the MIP images confirms the above findings.  IMPRESSION: Large saddle type pulmonary embolus is noted which extends into lower lobe branches of both pulmonary arteries. Positive for acute PE with CT evidence of right heart strain (RV/LV Ratio = 1.4) consistent with at least submassive (intermediate risk) PE. The presence of right heart strain has been associated with an increased risk of morbidity and mortality. Please refer to the "Code PE Focused" order set in EPIC. Critical Value/emergent results were called by telephone at the time of interpretation on 05/31/2023 at 10:39 am to provider [facility] , who verbally acknowledged these results. Small sliding-type hiatal hernia. Coronary artery calcifications are noted. Electronically Signed   By: Lupita Raider M.D.   On: 05/31/2023 10:39   DG Chest Port 1 View  Result Date: 05/31/2023 CLINICAL DATA:  Dyspnea EXAM: PORTABLE CHEST 1 VIEW COMPARISON:  None Available. FINDINGS: Normal heart size. Normal mediastinal contour. No pneumothorax. No pleural effusion. Lungs appear clear, with no acute consolidative airspace disease and no pulmonary edema. IMPRESSION: No active disease. Electronically Signed   By: Delbert Phenix M.D.   On: 05/31/2023 08:53    Labs:  CBC: Recent Labs    05/31/23 0816 06/01/23 0607  WBC 8.4 7.8  HGB 13.9 11.2*  HCT 41.8 33.0*  PLT 179 148*    COAGS: Recent Labs    05/31/23 0816  INR 1.1    BMP: Recent Labs    05/31/23 0816 06/01/23 0607  NA 139 140  K 4.3 3.6  CL 108 105  CO2 21* 22  GLUCOSE 126* 125*  BUN 17 11  CALCIUM 9.0 8.4*  CREATININE 1.25* 1.00  GFRNONAA 45* 59*    LIVER FUNCTION TESTS: No results for input(s): "BILITOT", "AST", "ALT", "ALKPHOS", "PROT", "ALBUMIN" in the last 8760 hours.  Assessment and Plan:  77 y.o. female with HTN, HLD, OAS, CVA, recent retinal hemorrhage who presented to ED with CP and SOB, found to have bilateral PE with right heart strain, she is s/p PE thrombectomy by Dr. Milford Cage on 6/35/24.   VSS RF stable,  hgb dropped from 13.9 to 11.2 On heparin   R groin site with ecchymosis in the pubic area, soft and tender.  Suture removed w/o difficulty, oozing blood noted, hemostasis achieved after holding pressure for 2 min.  Dressing placed, patient was instructed to hold pressure on her right groin over the dressing when she changes her position such as standing, sitting, or squatting.   Further treatment plan per [facility]/pulmonary team  Appreciate and agree with the plan.  Please call IR for questions and concerns.    Electronically Signed: Willette Brace, PA-C 06/01/2023, 9:34 AM   I spent a total of 15 Minutes at the the patient's bedside AND on the patient's hospital floor or unit, greater than 50% of which was counseling/coordinating care for PE thrombectomy follow up.   This chart was dictated using voice recognition software.  Despite best efforts to proofread,  errors can occur which can change the documentation meaning.

## 2023-06-01 NOTE — Progress Notes (Signed)
  Echocardiogram 2D Echocardiogram has been performed.  Milda Smart 06/01/2023, 9:35 AM

## 2023-06-01 NOTE — Discharge Instructions (Signed)
Information on my medicine - ELIQUIS (apixaban)  This medication education was reviewed with me or my healthcare representative as part of my discharge preparation.  The pharmacist that spoke with me during my hospital stay was:    Why was Eliquis prescribed for you? Eliquis was prescribed to treat blood clots that may have been found in the veins of your legs (deep vein thrombosis) or in your lungs (pulmonary embolism) and to reduce the risk of them occurring again.  What do You need to know about Eliquis ? The starting dose is 10 mg (two 5 mg tablets) taken TWICE daily for the FIRST SEVEN (7) DAYS, then on (enter date)  06/08/23  the dose is reduced to ONE 5 mg tablet taken TWICE daily.  Eliquis may be taken with or without food.   Try to take the dose about the same time in the morning and in the evening. If you have difficulty swallowing the tablet whole please discuss with your pharmacist how to take the medication safely.  Take Eliquis exactly as prescribed and DO NOT stop taking Eliquis without talking to the doctor who prescribed the medication.  Stopping may increase your risk of developing a new blood clot.  Refill your prescription before you run out.  After discharge, you should have regular check-up appointments with your healthcare provider that is prescribing your Eliquis.    What do you do if you miss a dose? If a dose of ELIQUIS is not taken at the scheduled time, take it as soon as possible on the same day and twice-daily administration should be resumed. The dose should not be doubled to make up for a missed dose.  Important Safety Information A possible side effect of Eliquis is bleeding. You should call your healthcare provider right away if you experience any of the following: Bleeding from an injury or your nose that does not stop. Unusual colored urine (red or dark brown) or unusual colored stools (red or black). Unusual bruising for unknown reasons. A serious  fall or if you hit your head (even if there is no bleeding).  Some medicines may interact with Eliquis and might increase your risk of bleeding or clotting while on Eliquis. To help avoid this, consult your healthcare provider or pharmacist prior to using any new prescription or non-prescription medications, including herbals, vitamins, non-steroidal anti-inflammatory drugs (NSAIDs) and supplements.  This website has more information on Eliquis (apixaban): http://www.eliquis.com/eliquis/home

## 2023-06-01 NOTE — Plan of Care (Signed)
  Problem: Education: Goal: Understanding of CV disease, CV risk reduction, and recovery process will improve Outcome: Progressing   

## 2023-06-01 NOTE — Progress Notes (Signed)
ANTICOAGULATION CONSULT NOTE - Follow Up Consult  Pharmacy Consult for Heparin infusion Indication: pulmonary embolus  Allergies  Allergen Reactions   Sulfa Antibiotics Itching    Patient Measurements: Height: 5\' 2"  (157.5 cm) Weight: 73.9 kg (163 lb) IBW/kg (Calculated) : 50.1 Heparin Dosing Weight: 66 kg  Vital Signs: Temp: 98 F (36.7 C) (06/26 0350) Temp Source: Oral (06/26 0350) BP: 129/80 (06/26 0350) Pulse Rate: 77 (06/26 0350)  Labs: Recent Labs    05/31/23 0816 05/31/23 1026 05/31/23 1901 06/01/23 0607  HGB 13.9  --   --  11.2*  HCT 41.8  --   --  33.0*  PLT 179  --   --  148*  LABPROT 14.2  --   --   --   INR 1.1  --   --   --   HEPARINUNFRC  --   --  >1.10* 1.10*  CREATININE 1.25*  --   --   --   TROPONINIHS 102* 152*  --   --      Estimated Creatinine Clearance: 36.6 mL/min (A) (by C-G formula based on SCr of 1.25 mg/dL (H)).   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   05/30/2023   losartan (COZAAR) 50 MG tablet Take 50 mg by mouth daily.   05/30/2023   omeprazole (PRILOSEC) 40 MG capsule Take 1 capsule (40 mg total) by mouth in the morning and at bedtime. (Patient taking differently: Take 40 mg by mouth daily.) 180 capsule 3 05/30/2023   propranolol (INDERAL) 40 MG tablet Take 40 mg by mouth daily.   05/30/2023 at 0600   TURMERIC CURCUMIN PO Take 1 tablet by mouth daily.   05/30/2023    VITAMIN E PO Take 1 capsule by mouth daily.   05/30/2023    Assessment: 77 yo F presents with chest pain and SOB and found to have a large saddle PE extending to lower lobe branches of both pulmonary arteries with evidence of R heart strain. Patient not taking any anticoagulation prior to admission. Pharmacy consulted to dose heparin infusion for acute PE.   6/26 AM update:  Heparin level remains elevated  Goal of Therapy:  Heparin level 0.3-0.7 units/ml Monitor platelets by anticoagulation protocol: Yes   Plan:  Hold heparin drip x 30 minutes Resume at reduced infusion rate of 850 units/hr Check heparin level in 8 hours Monitor daily CBC, heparin level, and for s/sx of monitoring  Abran Duke, PharmD, BCPS Clinical Pharmacist Phone: [phone]

## 2023-06-01 NOTE — Progress Notes (Signed)
Pt doesn't want to wear CPAP. 

## 2023-06-01 NOTE — Progress Notes (Signed)
Bilateral lower extremity venous duplex has been completed. Preliminary results can be found in CV Proc through chart review.  Results were given to the patient's nurse, Miranda.  06/01/23 12:28 PM Olen Cordial RVT

## 2023-06-01 NOTE — Progress Notes (Signed)
PROGRESS NOTE        PATIENT DETAILS Name: Leah Knight Age: 77 y.o. Sex: female Date of Birth: 1946-11-07 Admit Date: 05/31/2023 Admitting Physician Jonah Blue, MD BJY:[id number], Adelfa Koh, MD  Brief Summary: Patient is a 77 y.o.  female with history of HTN, HLD, prior CVA, OSA-who presented with chest pain/exertional dyspnea-she was found to have saddle pulmonary embolism-evaluated by IR and underwent mechanical thrombectomy.  She was subsequently started on anticoagulation and admitted to [facility] service.  Significant events: 6/25>> admit to [facility]  Significant studies: 6/25>> CTA chest: Large saddle embolus  Significant microbiology data: 6/25>> COVID PCR: Negative  Procedures: 6/25>> mechanical thrombectomy by IR  Consults: PCCM IR  Subjective: Lying comfortably in bed-denies any chest pain or shortness of breath.  Feels much better-to transition to room air today.  She was ambulating in the room earlier this morning without any major exertional dyspnea.  Objective: Vitals: Blood pressure (!) 145/81, pulse 78, temperature 98 F (36.7 C), temperature source Oral, resp. rate 19, height 5\' 2"  (1.575 m), weight 73.9 kg, SpO2 95 %.   Exam: Gen Exam:Alert awake-not in any distress HEENT:atraumatic, normocephalic Chest: B/L clear to auscultation anteriorly CVS:S1S2 regular Abdomen:soft non tender, non distended Extremities:no edema Neurology: Non focal Skin: no rash  Pertinent Labs/Radiology:    Latest Ref Rng & Units 06/01/2023    6:07 AM 05/31/2023    8:16 AM 12/15/2014   10:52 AM  CBC  WBC 4.0 - 10.5 K/uL 7.8  8.4  5.9   Hemoglobin 12.0 - 15.0 g/dL 13.0  86.5  78.4   Hematocrit 36.0 - 46.0 % 33.0  41.8  42.8   Platelets 150 - 400 K/uL 148  179      Lab Results  Component Value Date   NA 140 06/01/2023   K 3.6 06/01/2023   CL 105 06/01/2023   CO2 22 06/01/2023      Assessment/Plan: Submassive pulmonary embolism Unprovoked  (daughter claims mostly sedentary lifestyle) Much better after mechanical thrombectomy Transition to Eliquis Await echo/lower extremity Doppler Mobilize with nursing staff Likely will require indefinite anticoagulation and outpatient hematology evaluation  HLD Statin  HTN BP stable-continue Propranolol Resume losartan when able  GERD PPI  BMI: Estimated body mass index is 29.81 kg/m as calculated from the following:   Height as of this encounter: 5\' 2"  (1.575 m).   Weight as of this encounter: 73.9 kg.   Code status:   Code Status: Full Code   DVT Prophylaxis: Heparin  Family Communication: Daughter at bedside   Disposition Plan: Status is: Inpatient Remains inpatient appropriate because: Severity of illness   Planned Discharge Destination:Home   Diet: Diet Order             Diet regular Room service appropriate? Yes; Fluid consistency: Thin  Diet effective ____                     Antimicrobial agents: Anti-infectives (From admission, onward)    None        MEDICATIONS: Scheduled Meds:  atorvastatin  10 mg Oral Daily   docusate sodium  100 mg Oral BID   pantoprazole  40 mg Oral Daily   propranolol  40 mg Oral Daily   sodium chloride flush  3 mL Intravenous Q12H   Continuous Infusions:  heparin 850 Units/hr (06/01/23 0959)  lactated ringers 75 mL/hr at 06/01/23 0342   PRN Meds:.acetaminophen **OR** acetaminophen, bisacodyl, hydrALAZINE, morphine injection, ondansetron **OR** ondansetron (ZOFRAN) IV, mouth rinse, oxyCODONE, perflutren lipid microspheres (DEFINITY) IV suspension, polyethylene glycol   I have personally reviewed following labs and imaging studies  LABORATORY DATA: CBC: Recent Labs  Lab 05/31/23 0816 06/01/23 0607  WBC 8.4 7.8  NEUTROABS 5.8  --   HGB 13.9 11.2*  HCT 41.8 33.0*  MCV 93.1 93.0  PLT 179 148*    Basic Metabolic Panel: Recent Labs  Lab 05/31/23 0816 06/01/23 0607  NA 139 140  K 4.3 3.6  CL  108 105  CO2 21* 22  GLUCOSE 126* 125*  BUN 17 11  CREATININE 1.25* 1.00  CALCIUM 9.0 8.4*    GFR: Estimated Creatinine Clearance: 45.7 mL/min (by C-G formula based on SCr of 1 mg/dL).  Liver Function Tests: No results for input(s): "AST", "ALT", "ALKPHOS", "BILITOT", "PROT", "ALBUMIN" in the last 168 hours. No results for input(s): "LIPASE", "AMYLASE" in the last 168 hours. No results for input(s): "AMMONIA" in the last 168 hours.  Coagulation Profile: Recent Labs  Lab 05/31/23 0816  INR 1.1    Cardiac Enzymes: No results for input(s): "CKTOTAL", "CKMB", "CKMBINDEX", "TROPONINI" in the last 168 hours.  BNP (last 3 results) No results for input(s): "PROBNP" in the last 8760 hours.  Lipid Profile: No results for input(s): "CHOL", "HDL", "LDLCALC", "TRIG", "CHOLHDL", "LDLDIRECT" in the last 72 hours.  Thyroid Function Tests: No results for input(s): "TSH", "T4TOTAL", "FREET4", "T3FREE", "THYROIDAB" in the last 72 hours.  Anemia Panel: No results for input(s): "VITAMINB12", "FOLATE", "FERRITIN", "TIBC", "IRON", "RETICCTPCT" in the last 72 hours.  Urine analysis: No results found for: "COLORURINE", "APPEARANCEUR", "LABSPEC", "PHURINE", "GLUCOSEU", "HGBUR", "BILIRUBINUR", "KETONESUR", "PROTEINUR", "UROBILINOGEN", "NITRITE", "LEUKOCYTESUR"  Sepsis Labs: Lactic Acid, Venous No results found for: "LATICACIDVEN"  MICROBIOLOGY: Recent Results (from the past 240 hour(s))  SARS Coronavirus 2 by RT PCR (hospital order, performed in [facility] hospital lab) *cepheid single result test* Anterior Nasal Swab     Status: None   Collection Time: 05/31/23  8:27 AM   Specimen: Anterior Nasal Swab  Result Value Ref Range Status   SARS Coronavirus 2 by RT PCR NEGATIVE NEGATIVE Final    Comment: Performed at [facility], [address], [place], [place] [address]    RADIOLOGY STUDIES/RESULTS: ECHOCARDIOGRAM LIMITED  Result Date: 06/01/2023    ECHOCARDIOGRAM LIMITED REPORT    Patient Name:   Leah Knight [facility] Date of Exam: 06/01/2023 Medical Rec #:  [id number]         Height:       62.0 in Accession #:    [id number]        Weight:       163.0 lb Date of Birth:  [birth year]         BSA:          1.753 m Patient Age:    76 years          BP:           145/81 mmHg Patient Gender: F                 HR:           80 bpm. Exam Location:  Inpatient Procedure: 2D Echo, Cardiac Doppler, Color Doppler and Strain Analysis           STAT ECHO Reported to: Dr Lennie Odor. Indications:    Pulmonary embolism  History:        Patient has no prior history of Echocardiogram examinations.                 Stroke, Signs/Symptoms:Chest Pain and Shortness of Breath; Risk                 Factors:Hypertension, Dyslipidemia and Sleep Apnea.  Sonographer:    Milda Smart Referring Phys: [id number] JENNIFER YATES  Sonographer Comments: Image acquisition challenging due to respiratory motion. IMPRESSIONS  1. Mobile linear density 2.0 cm x 0.8 cm in the main pulmonary artery consistent with thrombus in this patient with known saddle PE.  2. Left ventricular ejection fraction, by estimation, is 70 to 75%. The left ventricle has hyperdynamic function. The left ventricle has no regional wall motion abnormalities. There is moderate concentric left ventricular hypertrophy. Left ventricular diastolic parameters are consistent with Grade I diastolic dysfunction (impaired relaxation). There is the interventricular septum is flattened in systole and diastole, consistent with right ventricular pressure and volume overload.  3. RV stain -8.1 which is abnormal. Right ventricular systolic function is moderately reduced. The right ventricular size is severely enlarged. Tricuspid regurgitation signal is inadequate for assessing PA pressure.  4. The mitral valve is grossly normal. No evidence of mitral valve regurgitation. No evidence of mitral stenosis.  5. The aortic valve is tricuspid. Aortic valve regurgitation is not visualized. No  aortic stenosis is present.  6. The inferior vena cava is normal in size with greater than 50% respiratory variability, suggesting right atrial pressure of 3 mmHg. FINDINGS  Left Ventricle: Left ventricular ejection fraction, by estimation, is 70 to 75%. The left ventricle has hyperdynamic function. The left ventricle has no regional wall motion abnormalities. Definity contrast agent was given IV to delineate the left ventricular endocardial borders. The left ventricular internal cavity size was normal in size. There is moderate concentric left ventricular hypertrophy. The interventricular septum is flattened in systole and diastole, consistent with right ventricular pressure and volume overload. Left ventricular diastolic parameters are consistent with Grade I diastolic dysfunction (impaired relaxation). Right Ventricle: RV stain -8.1 which is abnormal. The right ventricular size is severely enlarged. No increase in right ventricular wall thickness. Right ventricular systolic function is moderately reduced. Tricuspid regurgitation signal is inadequate for assessing PA pressure. Left Atrium: Left atrial size was normal in size. Right Atrium: Right atrial size was normal in size. Pericardium: Trivial pericardial effusion is present. Mitral Valve: The mitral valve is grossly normal. No evidence of mitral valve stenosis. MV peak gradient, 3.3 mmHg. The mean mitral valve gradient is 1.0 mmHg. Tricuspid Valve: The tricuspid valve is grossly normal. Tricuspid valve regurgitation is mild . No evidence of tricuspid stenosis. Aortic Valve: The aortic valve is tricuspid. Aortic valve regurgitation is not visualized. No aortic stenosis is present. Pulmonic Valve: The pulmonic valve was grossly normal. Pulmonic valve regurgitation is trivial. No evidence of pulmonic stenosis. Aorta: The aortic root and ascending aorta are structurally normal, with no evidence of dilitation. Pulmonary Artery: Mobile linear density 2.0 cm x 0.8  cm in the main pulmonary artery consistent with thrombus in this patient with known saddle PE. Venous: The inferior vena cava is normal in size with greater than 50% respiratory variability, suggesting right atrial pressure of 3 mmHg. IAS/Shunts: The atrial septum is grossly normal. Additional Comments: Spectral Doppler performed. Color Doppler performed.  LEFT VENTRICLE PLAX 2D LVIDd:         2.90 cm   Diastology LVIDs:  0.90 cm   LV e' medial:    4.46 cm/s LV PW:         1.50 cm   LV E/e' medial:  14.2 LV IVS:        1.40 cm   LV e' lateral:   5.22 cm/s LVOT diam:     1.70 cm   LV E/e' lateral: 12.1 LV SV:         46 LV SV Index:   26 LVOT Area:     2.27 cm  RIGHT VENTRICLE RV S prime:     13.40 cm/s TAPSE (M-mode): 1.5 cm LEFT ATRIUM             Index        RIGHT ATRIUM           Index LA diam:        2.90 cm 1.65 cm/m   RA Area:     15.50 cm LA Vol (A2C):   26.0 ml 14.84 ml/m  RA Volume:   40.80 ml  23.28 ml/m LA Vol (A4C):   26.5 ml 15.12 ml/m LA Biplane Vol: 26.3 ml 15.01 ml/m  AORTIC VALVE LVOT Vmax:   112.00 cm/s LVOT Vmean:  75.800 cm/s LVOT VTI:    0.202 m  AORTA Ao Root diam: 3.00 cm Ao Asc diam:  3.40 cm MITRAL VALVE MV Area (PHT): 2.36 cm    SHUNTS MV Peak grad:  3.3 mmHg    Systemic VTI:  0.20 m MV Mean grad:  1.0 mmHg    Systemic Diam: 1.70 cm MV Vmax:       0.90 m/s MV Vmean:      58.1 cm/s MV Decel Time: 322 msec MV E velocity: 63.20 cm/s MV A velocity: 89.20 cm/s MV E/A ratio:  0.71 Lennie Odor MD Electronically signed by Lennie Odor MD Signature Date/Time: 06/01/2023/10:12:54 AM    Final    IR US Guide Vasc Access Right  Result Date: 05/31/2023 INDICATION: Submassive PE. Briefly, 77 year old female presenting with severe shortness of breath. ER CTA PE demonstrating saddle embolus, submassive PE. RV/LV ratio 1.4. EXAM: Procedures: 1. PULMONARY ARTERIOGRAPHY 2. CATHETER-DIRECTED MECHANICAL THROMBECTOMY COMPARISON:  Chest XR and CTA PE, 12/30/2022. MEDICATIONS: 5K IU heparin  IV. ANESTHESIA/SEDATION: Moderate (conscious) sedation was employed during this procedure. A total of Versed 2 mg and Fentanyl 25 mcg was administered intravenously. Moderate Sedation Time: 75 minutes. The patient's level of consciousness and vital signs were monitored continuously by radiology nursing throughout the procedure under my direct supervision. CONTRAST:  50mL OMNIPAQUE IOHEXOL 300 MG/ML  SOLN FLUOROSCOPY TIME:  Fluoroscopic dose; 94 mGy COMPLICATIONS: None immediate. TECHNIQUE: Informed written consent was obtained from the patient and/or patient's representative after a discussion of the risks, benefits and alternatives to treatment. Questions regarding the procedure were encouraged and answered. A timeout was performed prior to the initiation of the procedure. Ultrasound scanning was performed of the RIGHT groin and demonstrated patency of the RIGHT common femoral and greater saphenous veins. The RIGHT groin was prepped and draped in the usual sterile fashion, and a sterile drape was applied covering the operative field. Maximum barrier sterile technique with sterile gowns and gloves were used for the procedure. A timeout was performed prior to the initiation of the procedure. Local anesthesia was provided with 1% lidocaine. Under direct ultrasound guidance, the RIGHT greater saphenous vein was accessed with a micro puncture kit ultimately allowing placement of a 7 Fr vascular sheath. Ultrasound and fluoroscopic spot images were  saved for procedural documentation purposes. With the use of a 0.035 inch Bentson wire and an angled pigtail pulmonary catheter, access past the RIGHT heart and into the main pulmonary artery was obtained. Pre intervention central pulmonary arteriogram was performed. The Bentson wire was exchanged for an Amplatz wire then a 16 Fr, 33 cm GORE dry seal sheath then a 16 Fr Penumbra Lightning Flash aspiration catheter was advanced into the RIGHT then LEFT pulmonary arteries and  catheter directed thrombectomy was performed. Intermittent and postprocedural pulmonary arteriograms were performed during thrombus removal. Following adequate angiographic result and the catheter and sheath were then removed, and hemostasis was obtained by manual pressure. The patient tolerated the procedure well without immediate postprocedural complication. FINDINGS: *Central pulmonary arteriogram demonstrates saddle embolus and additional burden predominantly at the pulmonary arterial bifurcations, bilateral inferior lobar and the RIGHT superior lobar pulmonary artery. *Post interventional pulmonary arteriograms demonstrating adequate debulking thrombectomy from the bilateral inferior lobar pulmonary arteries. *Mild residual thrombus burden. IMPRESSION: Successful catheter-directed bilateral pulmonary arterial debulking mechanical thrombectomy. PLAN: 1. Continue systemic anticoagulation with heparin gtt and transition to DOAC when clinically feasible. 2. The patient will be seen by me in the Vascular Interventional Radiology (VIR) clinic for postoperative follow-up, and including repeat BILATERAL lower extremity venous duplex in 1 month. Roanna Banning, MD [facility] Radiology Electronically Signed   By: Roanna Banning M.D.   On: 05/31/2023 16:29   IR THROMBECT PRIM MECH INIT (INCLU) MOD SED  Result Date: 05/31/2023 INDICATION: Submassive PE. Briefly, 77 year old female presenting with severe shortness of breath. ER CTA PE demonstrating saddle embolus, submassive PE. RV/LV ratio 1.4. EXAM: Procedures: 1. PULMONARY ARTERIOGRAPHY 2. CATHETER-DIRECTED MECHANICAL THROMBECTOMY COMPARISON:  Chest XR and CTA PE, 12/30/2022. MEDICATIONS: 5K IU heparin IV. ANESTHESIA/SEDATION: Moderate (conscious) sedation was employed during this procedure. A total of Versed 2 mg and Fentanyl 25 mcg was administered intravenously. Moderate Sedation Time: 75 minutes. The patient's level of  consciousness and vital signs were monitored continuously by radiology nursing throughout the procedure under my direct supervision. CONTRAST:  50mL OMNIPAQUE IOHEXOL 300 MG/ML  SOLN FLUOROSCOPY TIME:  Fluoroscopic dose; 94 mGy COMPLICATIONS: None immediate. TECHNIQUE: Informed written consent was obtained from the patient and/or patient's representative after a discussion of the risks, benefits and alternatives to treatment. Questions regarding the procedure were encouraged and answered. A timeout was performed prior to the initiation of the procedure. Ultrasound scanning was performed of the RIGHT groin and demonstrated patency of the RIGHT common femoral and greater saphenous veins. The RIGHT groin was prepped and draped in the usual sterile fashion, and a sterile drape was applied covering the operative field. Maximum barrier sterile technique with sterile gowns and gloves were used for the procedure. A timeout was performed prior to the initiation of the procedure. Local anesthesia was provided with 1% lidocaine. Under direct ultrasound guidance, the RIGHT greater saphenous vein was accessed with a micro puncture kit ultimately allowing placement of a 7 Fr vascular sheath. Ultrasound and fluoroscopic spot images were saved for procedural documentation purposes. With the use of a 0.035 inch Bentson wire and an angled pigtail pulmonary catheter, access past the RIGHT heart and into the main pulmonary artery was obtained. Pre intervention central pulmonary arteriogram was performed. The Bentson wire was exchanged for an Amplatz wire then a 16 Fr, 33 cm GORE dry seal sheath then a 16 Fr Penumbra Lightning Flash aspiration catheter was advanced into the RIGHT then LEFT pulmonary arteries and catheter directed thrombectomy  was performed. Intermittent and postprocedural pulmonary arteriograms were performed during thrombus removal. Following adequate angiographic result and the catheter and sheath were then removed,  and hemostasis was obtained by manual pressure. The patient tolerated the procedure well without immediate postprocedural complication. FINDINGS: *Central pulmonary arteriogram demonstrates saddle embolus and additional burden predominantly at the pulmonary arterial bifurcations, bilateral inferior lobar and the RIGHT superior lobar pulmonary artery. *Post interventional pulmonary arteriograms demonstrating adequate debulking thrombectomy from the bilateral inferior lobar pulmonary arteries. *Mild residual thrombus burden. IMPRESSION: Successful catheter-directed bilateral pulmonary arterial debulking mechanical thrombectomy. PLAN: 1. Continue systemic anticoagulation with heparin gtt and transition to DOAC when clinically feasible. 2. The patient will be seen by me in the Vascular Interventional Radiology (VIR) clinic for postoperative follow-up, and including repeat BILATERAL lower extremity venous duplex in 1 month. Roanna Banning, MD [facility] Radiology Electronically Signed   By: Roanna Banning M.D.   On: 05/31/2023 16:29   IR THROMBECT PRIM MECH INIT (INCLU) MOD SED  Result Date: 05/31/2023 INDICATION: Submassive PE. Briefly, 77 year old female presenting with severe shortness of breath. ER CTA PE demonstrating saddle embolus, submassive PE. RV/LV ratio 1.4. EXAM: Procedures: 1. PULMONARY ARTERIOGRAPHY 2. CATHETER-DIRECTED MECHANICAL THROMBECTOMY COMPARISON:  Chest XR and CTA PE, 12/30/2022. MEDICATIONS: 5K IU heparin IV. ANESTHESIA/SEDATION: Moderate (conscious) sedation was employed during this procedure. A total of Versed 2 mg and Fentanyl 25 mcg was administered intravenously. Moderate Sedation Time: 75 minutes. The patient's level of consciousness and vital signs were monitored continuously by radiology nursing throughout the procedure under my direct supervision. CONTRAST:  50mL OMNIPAQUE IOHEXOL 300 MG/ML  SOLN FLUOROSCOPY TIME:  Fluoroscopic dose; 94  mGy COMPLICATIONS: None immediate. TECHNIQUE: Informed written consent was obtained from the patient and/or patient's representative after a discussion of the risks, benefits and alternatives to treatment. Questions regarding the procedure were encouraged and answered. A timeout was performed prior to the initiation of the procedure. Ultrasound scanning was performed of the RIGHT groin and demonstrated patency of the RIGHT common femoral and greater saphenous veins. The RIGHT groin was prepped and draped in the usual sterile fashion, and a sterile drape was applied covering the operative field. Maximum barrier sterile technique with sterile gowns and gloves were used for the procedure. A timeout was performed prior to the initiation of the procedure. Local anesthesia was provided with 1% lidocaine. Under direct ultrasound guidance, the RIGHT greater saphenous vein was accessed with a micro puncture kit ultimately allowing placement of a 7 Fr vascular sheath. Ultrasound and fluoroscopic spot images were saved for procedural documentation purposes. With the use of a 0.035 inch Bentson wire and an angled pigtail pulmonary catheter, access past the RIGHT heart and into the main pulmonary artery was obtained. Pre intervention central pulmonary arteriogram was performed. The Bentson wire was exchanged for an Amplatz wire then a 16 Fr, 33 cm GORE dry seal sheath then a 16 Fr Penumbra Lightning Flash aspiration catheter was advanced into the RIGHT then LEFT pulmonary arteries and catheter directed thrombectomy was performed. Intermittent and postprocedural pulmonary arteriograms were performed during thrombus removal. Following adequate angiographic result and the catheter and sheath were then removed, and hemostasis was obtained by manual pressure. The patient tolerated the procedure well without immediate postprocedural complication. FINDINGS: *Central pulmonary arteriogram demonstrates saddle embolus and additional  burden predominantly at the pulmonary arterial bifurcations, bilateral inferior lobar and the RIGHT superior lobar pulmonary artery. *Post interventional pulmonary arteriograms demonstrating adequate debulking thrombectomy from the bilateral inferior lobar  pulmonary arteries. *Mild residual thrombus burden. IMPRESSION: Successful catheter-directed bilateral pulmonary arterial debulking mechanical thrombectomy. PLAN: 1. Continue systemic anticoagulation with heparin gtt and transition to DOAC when clinically feasible. 2. The patient will be seen by me in the Vascular Interventional Radiology (VIR) clinic for postoperative follow-up, and including repeat BILATERAL lower extremity venous duplex in 1 month. Roanna Banning, MD [facility] Radiology Electronically Signed   By: Roanna Banning M.D.   On: 05/31/2023 16:29   IR Angiogram Pulmonary Bilateral Selective  Result Date: 05/31/2023 INDICATION: Submassive PE. Briefly, 77 year old female presenting with severe shortness of breath. ER CTA PE demonstrating saddle embolus, submassive PE. RV/LV ratio 1.4. EXAM: Procedures: 1. PULMONARY ARTERIOGRAPHY 2. CATHETER-DIRECTED MECHANICAL THROMBECTOMY COMPARISON:  Chest XR and CTA PE, 12/30/2022. MEDICATIONS: 5K IU heparin IV. ANESTHESIA/SEDATION: Moderate (conscious) sedation was employed during this procedure. A total of Versed 2 mg and Fentanyl 25 mcg was administered intravenously. Moderate Sedation Time: 75 minutes. The patient's level of consciousness and vital signs were monitored continuously by radiology nursing throughout the procedure under my direct supervision. CONTRAST:  50mL OMNIPAQUE IOHEXOL 300 MG/ML  SOLN FLUOROSCOPY TIME:  Fluoroscopic dose; 94 mGy COMPLICATIONS: None immediate. TECHNIQUE: Informed written consent was obtained from the patient and/or patient's representative after a discussion of the risks, benefits and alternatives to treatment. Questions  regarding the procedure were encouraged and answered. A timeout was performed prior to the initiation of the procedure. Ultrasound scanning was performed of the RIGHT groin and demonstrated patency of the RIGHT common femoral and greater saphenous veins. The RIGHT groin was prepped and draped in the usual sterile fashion, and a sterile drape was applied covering the operative field. Maximum barrier sterile technique with sterile gowns and gloves were used for the procedure. A timeout was performed prior to the initiation of the procedure. Local anesthesia was provided with 1% lidocaine. Under direct ultrasound guidance, the RIGHT greater saphenous vein was accessed with a micro puncture kit ultimately allowing placement of a 7 Fr vascular sheath. Ultrasound and fluoroscopic spot images were saved for procedural documentation purposes. With the use of a 0.035 inch Bentson wire and an angled pigtail pulmonary catheter, access past the RIGHT heart and into the main pulmonary artery was obtained. Pre intervention central pulmonary arteriogram was performed. The Bentson wire was exchanged for an Amplatz wire then a 16 Fr, 33 cm GORE dry seal sheath then a 16 Fr Penumbra Lightning Flash aspiration catheter was advanced into the RIGHT then LEFT pulmonary arteries and catheter directed thrombectomy was performed. Intermittent and postprocedural pulmonary arteriograms were performed during thrombus removal. Following adequate angiographic result and the catheter and sheath were then removed, and hemostasis was obtained by manual pressure. The patient tolerated the procedure well without immediate postprocedural complication. FINDINGS: *Central pulmonary arteriogram demonstrates saddle embolus and additional burden predominantly at the pulmonary arterial bifurcations, bilateral inferior lobar and the RIGHT superior lobar pulmonary artery. *Post interventional pulmonary arteriograms demonstrating adequate debulking  thrombectomy from the bilateral inferior lobar pulmonary arteries. *Mild residual thrombus burden. IMPRESSION: Successful catheter-directed bilateral pulmonary arterial debulking mechanical thrombectomy. PLAN: 1. Continue systemic anticoagulation with heparin gtt and transition to DOAC when clinically feasible. 2. The patient will be seen by me in the Vascular Interventional Radiology (VIR) clinic for postoperative follow-up, and including repeat BILATERAL lower extremity venous duplex in 1 month. Roanna Banning, MD [facility] Radiology Electronically Signed   By: Roanna Banning M.D.   On: 05/31/2023 16:29  IR Angiogram Selective Each Additional Vessel  Result Date: 05/31/2023 INDICATION: Submassive PE. Briefly, 77 year old female presenting with severe shortness of breath. ER CTA PE demonstrating saddle embolus, submassive PE. RV/LV ratio 1.4. EXAM: Procedures: 1. PULMONARY ARTERIOGRAPHY 2. CATHETER-DIRECTED MECHANICAL THROMBECTOMY COMPARISON:  Chest XR and CTA PE, 12/30/2022. MEDICATIONS: 5K IU heparin IV. ANESTHESIA/SEDATION: Moderate (conscious) sedation was employed during this procedure. A total of Versed 2 mg and Fentanyl 25 mcg was administered intravenously. Moderate Sedation Time: 75 minutes. The patient's level of consciousness and vital signs were monitored continuously by radiology nursing throughout the procedure under my direct supervision. CONTRAST:  50mL OMNIPAQUE IOHEXOL 300 MG/ML  SOLN FLUOROSCOPY TIME:  Fluoroscopic dose; 94 mGy COMPLICATIONS: None immediate. TECHNIQUE: Informed written consent was obtained from the patient and/or patient's representative after a discussion of the risks, benefits and alternatives to treatment. Questions regarding the procedure were encouraged and answered. A timeout was performed prior to the initiation of the procedure. Ultrasound scanning was performed of the RIGHT groin and demonstrated patency of the RIGHT  common femoral and greater saphenous veins. The RIGHT groin was prepped and draped in the usual sterile fashion, and a sterile drape was applied covering the operative field. Maximum barrier sterile technique with sterile gowns and gloves were used for the procedure. A timeout was performed prior to the initiation of the procedure. Local anesthesia was provided with 1% lidocaine. Under direct ultrasound guidance, the RIGHT greater saphenous vein was accessed with a micro puncture kit ultimately allowing placement of a 7 Fr vascular sheath. Ultrasound and fluoroscopic spot images were saved for procedural documentation purposes. With the use of a 0.035 inch Bentson wire and an angled pigtail pulmonary catheter, access past the RIGHT heart and into the main pulmonary artery was obtained. Pre intervention central pulmonary arteriogram was performed. The Bentson wire was exchanged for an Amplatz wire then a 16 Fr, 33 cm GORE dry seal sheath then a 16 Fr Penumbra Lightning Flash aspiration catheter was advanced into the RIGHT then LEFT pulmonary arteries and catheter directed thrombectomy was performed. Intermittent and postprocedural pulmonary arteriograms were performed during thrombus removal. Following adequate angiographic result and the catheter and sheath were then removed, and hemostasis was obtained by manual pressure. The patient tolerated the procedure well without immediate postprocedural complication. FINDINGS: *Central pulmonary arteriogram demonstrates saddle embolus and additional burden predominantly at the pulmonary arterial bifurcations, bilateral inferior lobar and the RIGHT superior lobar pulmonary artery. *Post interventional pulmonary arteriograms demonstrating adequate debulking thrombectomy from the bilateral inferior lobar pulmonary arteries. *Mild residual thrombus burden. IMPRESSION: Successful catheter-directed bilateral pulmonary arterial debulking mechanical thrombectomy. PLAN: 1. Continue  systemic anticoagulation with heparin gtt and transition to DOAC when clinically feasible. 2. The patient will be seen by me in the Vascular Interventional Radiology (VIR) clinic for postoperative follow-up, and including repeat BILATERAL lower extremity venous duplex in 1 month. Roanna Banning, MD [facility] Radiology Electronically Signed   By: Roanna Banning M.D.   On: 05/31/2023 16:29   IR Angiogram Selective Each Additional Vessel  Result Date: 05/31/2023 INDICATION: Submassive PE. Briefly, 77 year old female presenting with severe shortness of breath. ER CTA PE demonstrating saddle embolus, submassive PE. RV/LV ratio 1.4. EXAM: Procedures: 1. PULMONARY ARTERIOGRAPHY 2. CATHETER-DIRECTED MECHANICAL THROMBECTOMY COMPARISON:  Chest XR and CTA PE, 12/30/2022. MEDICATIONS: 5K IU heparin IV. ANESTHESIA/SEDATION: Moderate (conscious) sedation was employed during this procedure. A total of Versed 2 mg and Fentanyl 25 mcg was administered intravenously. Moderate Sedation Time: 75 minutes. The  patient's level of consciousness and vital signs were monitored continuously by radiology nursing throughout the procedure under my direct supervision. CONTRAST:  50mL OMNIPAQUE IOHEXOL 300 MG/ML  SOLN FLUOROSCOPY TIME:  Fluoroscopic dose; 94 mGy COMPLICATIONS: None immediate. TECHNIQUE: Informed written consent was obtained from the patient and/or patient's representative after a discussion of the risks, benefits and alternatives to treatment. Questions regarding the procedure were encouraged and answered. A timeout was performed prior to the initiation of the procedure. Ultrasound scanning was performed of the RIGHT groin and demonstrated patency of the RIGHT common femoral and greater saphenous veins. The RIGHT groin was prepped and draped in the usual sterile fashion, and a sterile drape was applied covering the operative field. Maximum barrier sterile technique with sterile  gowns and gloves were used for the procedure. A timeout was performed prior to the initiation of the procedure. Local anesthesia was provided with 1% lidocaine. Under direct ultrasound guidance, the RIGHT greater saphenous vein was accessed with a micro puncture kit ultimately allowing placement of a 7 Fr vascular sheath. Ultrasound and fluoroscopic spot images were saved for procedural documentation purposes. With the use of a 0.035 inch Bentson wire and an angled pigtail pulmonary catheter, access past the RIGHT heart and into the main pulmonary artery was obtained. Pre intervention central pulmonary arteriogram was performed. The Bentson wire was exchanged for an Amplatz wire then a 16 Fr, 33 cm GORE dry seal sheath then a 16 Fr Penumbra Lightning Flash aspiration catheter was advanced into the RIGHT then LEFT pulmonary arteries and catheter directed thrombectomy was performed. Intermittent and postprocedural pulmonary arteriograms were performed during thrombus removal. Following adequate angiographic result and the catheter and sheath were then removed, and hemostasis was obtained by manual pressure. The patient tolerated the procedure well without immediate postprocedural complication. FINDINGS: *Central pulmonary arteriogram demonstrates saddle embolus and additional burden predominantly at the pulmonary arterial bifurcations, bilateral inferior lobar and the RIGHT superior lobar pulmonary artery. *Post interventional pulmonary arteriograms demonstrating adequate debulking thrombectomy from the bilateral inferior lobar pulmonary arteries. *Mild residual thrombus burden. IMPRESSION: Successful catheter-directed bilateral pulmonary arterial debulking mechanical thrombectomy. PLAN: 1. Continue systemic anticoagulation with heparin gtt and transition to DOAC when clinically feasible. 2. The patient will be seen by me in the Vascular Interventional Radiology (VIR) clinic for postoperative follow-up, and including  repeat BILATERAL lower extremity venous duplex in 1 month. Roanna Banning, MD [facility] Radiology Electronically Signed   By: Roanna Banning M.D.   On: 05/31/2023 16:29   CT Angio Chest PE W and/or Wo Contrast  Result Date: 05/31/2023 CLINICAL DATA:  Shortness of breath. EXAM: CT ANGIOGRAPHY CHEST WITH CONTRAST TECHNIQUE: Multidetector CT imaging of the chest was performed using the standard protocol during bolus administration of intravenous contrast. Multiplanar CT image reconstructions and MIPs were obtained to evaluate the vascular anatomy. RADIATION DOSE REDUCTION: This exam was performed according to the departmental dose-optimization program which includes automated exposure control, adjustment of the mA and/or kV according to patient size and/or use of iterative reconstruction technique. CONTRAST:  75mL OMNIPAQUE IOHEXOL 350 MG/ML SOLN COMPARISON:  Radiograph of same day. FINDINGS: Cardiovascular: Large saddle embolus is noted which extends into lower lobe branches of both pulmonary arteries. RV/LV ratio of 1.4 is noted suggesting right heart strain. No pericardial effusion. Coronary artery calcifications are noted. Mediastinum/Nodes: Small sliding-type hiatal hernia. No adenopathy is noted. Thyroid gland is unremarkable. Lungs/Pleura: Lungs are clear. No pleural effusion or pneumothorax. Upper Abdomen: No acute abnormality.  Musculoskeletal: No chest wall abnormality. No acute or significant osseous findings. Review of the MIP images confirms the above findings. IMPRESSION: Large saddle type pulmonary embolus is noted which extends into lower lobe branches of both pulmonary arteries. Positive for acute PE with CT evidence of right heart strain (RV/LV Ratio = 1.4) consistent with at least submassive (intermediate risk) PE. The presence of right heart strain has been associated with an increased risk of morbidity and mortality. Please refer to the "Code PE  Focused" order set in EPIC. Critical Value/emergent results were called by telephone at the time of interpretation on 05/31/2023 at 10:39 am to provider [facility] , who verbally acknowledged these results. Small sliding-type hiatal hernia. Coronary artery calcifications are noted. Electronically Signed   By: Lupita Raider M.D.   On: 05/31/2023 10:39   DG Chest Port 1 View  Result Date: 05/31/2023 CLINICAL DATA:  Dyspnea EXAM: PORTABLE CHEST 1 VIEW COMPARISON:  None Available. FINDINGS: Normal heart size. Normal mediastinal contour. No pneumothorax. No pleural effusion. Lungs appear clear, with no acute consolidative airspace disease and no pulmonary edema. IMPRESSION: No active disease. Electronically Signed   By: Delbert Phenix M.D.   On: 05/31/2023 08:53     LOS: 1 day   Jeoffrey Massed, MD  [facility]    To contact the attending provider between 7A-7P or the covering provider during after hours 7P-7A, please log into the web site [email or web address] and access using universal [place] password for that web site. If you do not have the password, please call the hospital operator.  06/01/2023, 10:17 AM

## 2023-06-01 NOTE — TOC Benefit Eligibility Note (Signed)
Pharmacy Patient Advocate Encounter  Insurance verification completed.    The patient is insured through [employer] Medicare Part D  Ran test claim for Eliquis 5 mg and the current 30 day co-pay is $47.00.   This test claim was processed through [facility]- copay amounts may vary at other pharmacies due to pharmacy/plan contracts, or as the patient moves through the different stages of their insurance plan.    Khrystian Schauf, CPHT Pharmacy Patient Advocate Specialist [facility] Patient Advocate Team Direct Number: [phone]  Fax: [phone] 

## 2023-06-02 ENCOUNTER — Other Ambulatory Visit (HOSPITAL_COMMUNITY): Payer: Self-pay

## 2023-06-02 DIAGNOSIS — N1831 Chronic kidney disease, stage 3a: Secondary | ICD-10-CM | POA: Diagnosis not present

## 2023-06-02 DIAGNOSIS — I10 Essential (primary) hypertension: Secondary | ICD-10-CM | POA: Diagnosis not present

## 2023-06-02 DIAGNOSIS — I2602 Saddle embolus of pulmonary artery with acute cor pulmonale: Secondary | ICD-10-CM | POA: Diagnosis not present

## 2023-06-02 DIAGNOSIS — E785 Hyperlipidemia, unspecified: Secondary | ICD-10-CM | POA: Diagnosis not present

## 2023-06-02 LAB — CBC
HCT: 32.2 % — ABNORMAL LOW (ref 36.0–46.0)
Hemoglobin: 10.6 g/dL — ABNORMAL LOW (ref 12.0–15.0)
MCH: 30.5 pg (ref 26.0–34.0)
MCHC: 32.9 g/dL (ref 30.0–36.0)
MCV: 92.5 fL (ref 80.0–100.0)
Platelets: 155 10*3/uL (ref 150–400)
RBC: 3.48 MIL/uL — ABNORMAL LOW (ref 3.87–5.11)
RDW: 13.7 % (ref 11.5–15.5)
WBC: 7.1 10*3/uL (ref 4.0–10.5)
nRBC: 0 % (ref 0.0–0.2)

## 2023-06-02 LAB — SURGICAL PATHOLOGY

## 2023-06-02 MED ORDER — APIXABAN 5 MG PO TABS
ORAL_TABLET | ORAL | 4 refills | Status: DC
  Filled 2023-06-02: qty 58, 30d supply, fill #0

## 2023-06-02 NOTE — Plan of Care (Signed)
AVS reviewed with pt by charge RN Christiane Ha. Pt in NAD and pt's daughter is present to take pt home via private vehicle.    Problem: Education: Goal: Understanding of CV disease, CV risk reduction, and recovery process will improve Outcome: Adequate for Discharge Goal: Individualized Educational Video(s) Outcome: Adequate for Discharge   Problem: Activity: Goal: Ability to return to baseline activity level will improve Outcome: Adequate for Discharge   Problem: Cardiovascular: Goal: Ability to achieve and maintain adequate cardiovascular perfusion will improve Outcome: Adequate for Discharge Goal: Vascular access site(s) Level 0-1 will be maintained Outcome: Adequate for Discharge   Problem: Health Behavior/Discharge Planning: Goal: Ability to safely manage health-related needs after discharge will improve Outcome: Adequate for Discharge   Problem: Education: Goal: Knowledge of General Education information will improve Description: Including pain rating scale, medication(s)/side effects and non-pharmacologic comfort measures Outcome: Adequate for Discharge   Problem: Health Behavior/Discharge Planning: Goal: Ability to manage health-related needs will improve Outcome: Adequate for Discharge   Problem: Clinical Measurements: Goal: Ability to maintain clinical measurements within normal limits will improve Outcome: Adequate for Discharge Goal: Will remain free from infection Outcome: Adequate for Discharge Goal: Diagnostic test results will improve Outcome: Adequate for Discharge Goal: Respiratory complications will improve Outcome: Adequate for Discharge Goal: Cardiovascular complication will be avoided Outcome: Adequate for Discharge   Problem: Activity: Goal: Risk for activity intolerance will decrease Outcome: Adequate for Discharge   Problem: Nutrition: Goal: Adequate nutrition will be maintained Outcome: Adequate for Discharge   Problem: Coping: Goal: Level of  anxiety will decrease Outcome: Adequate for Discharge   Problem: Elimination: Goal: Will not experience complications related to bowel motility Outcome: Adequate for Discharge Goal: Will not experience complications related to urinary retention Outcome: Adequate for Discharge   Problem: Pain Managment: Goal: General experience of comfort will improve Outcome: Adequate for Discharge   Problem: Safety: Goal: Ability to remain free from injury will improve Outcome: Adequate for Discharge   Problem: Skin Integrity: Goal: Risk for impaired skin integrity will decrease Outcome: Adequate for Discharge

## 2023-06-02 NOTE — TOC Transition Note (Signed)
Transition of Care [facility]) - CM/SW Discharge Note   Patient Details  Name: Leah Knight MRN: [id number] Date of Birth: 1946-06-23  Transition of Care [facility]) CM/SW Contact:  Mearl Latin, LCSW Phone Number: 06/02/2023, 11:05 AM   Clinical Narrative:    CSW received request to speak with patient about assistance with a handicap parking placard. CSW printed forms from [facility] website and provider filled out appropriate sections. CSW provided form to patient and her daughter. No further needs identified.    Final next level of care: Home/Self Care Barriers to Discharge: No Barriers Identified   Patient Goals and CMS Choice      Discharge Placement                         Discharge Plan and Services Additional resources added to the After Visit Summary for   In-house Referral: Clinical Social Work                                   Social Determinants of Health (SDOH) Interventions SDOH Screenings   Food Insecurity: No Food Insecurity (05/31/2023)  Housing: Low Risk  (05/31/2023)  Transportation Needs: No Transportation Needs (05/31/2023)  Utilities: Not At Risk (05/31/2023)  Tobacco Use: Low Risk  (05/31/2023)     Readmission Risk Interventions     No data to display

## 2023-06-02 NOTE — Discharge Summary (Signed)
PATIENT DETAILS Name: Leah Knight Age: 77 y.o. Sex: female Date of Birth: [birth year] MRN: [id number]. Admitting Physician: Jonah Blue, MD WUJ:[id number], Adelfa Koh, MD  Admit Date: 05/31/2023 Discharge date: 06/02/2023  Recommendations for Outpatient Follow-up:  Follow up with PCP in 1-2 weeks Please obtain CMP/CBC in one week Indefinite anticoagulation recommended Outpatient follow-up with hematology for hypercoagulable workup-unprovoked VTE. Please ensure you are patient is up-to-date with recommended cancer screening-given unprovoked VTE.  Admitted From:  Home  Disposition: Home   Discharge Condition: good  CODE STATUS:   Code Status: Full Code   Diet recommendation:  Diet Order             Diet - low sodium heart healthy           Diet regular Room service appropriate? Yes; Fluid consistency: Thin  Diet effective ____                    Brief Summary: Patient is a 77 y.o.  female with history of HTN, HLD, prior CVA, OSA-who presented with chest pain/exertional dyspnea-she was found to have saddle pulmonary embolism-evaluated by IR and underwent mechanical thrombectomy.  She was subsequently started on anticoagulation and admitted to [facility] service.   Significant events: 6/25>> admit to [facility]   Significant studies: 6/25>> CTA chest: Large saddle embolus 6/26>> echo: EF 70-75%, mobile linear density in the main pulmonary artery consistent with thrombus, right ventricular systolic function moderately reduced. 6/26>> lower extremity Doppler: Left popliteal vein/left peroneal vein DVT.   Significant microbiology data: 6/25>> COVID PCR: Negative   Procedures: 6/25>> mechanical thrombectomy by IR   Consults: PCCM IR    Brief Hospital Course: Submassive pulmonary embolism with LLE DVT Unprovoked (daughter claims mostly sedentary lifestyle) Much better after mechanical thrombectomy Initially on IV heparin-but has been transitioned to  Eliquis Workup as above-although much better than how she first presented-still somewhat short of breath with exertion.  Stable on room air-without any need for oxygen. Likely will require indefinite anticoagulation Epic referral sent to hematology for further workup in the outpatient setting.   HLD Statin   HTN BP creeping up-continue propranolol/losartan on discharge.    GERD PPI   BMI: Estimated body mass index is 29.81 kg/m as calculated from the following:   Height as of this encounter: 5\' 2"  (1.575 m).   Weight as of this encounter: 73.9 kg.     Discharge Diagnoses:  Principal Problem:   Acute saddle pulmonary embolism with acute cor pulmonale (HCC) Active Problems:   OSA on CPAP   Essential hypertension   Dyslipidemia   Chronic kidney disease, stage 3a [facility])   Discharge Instructions:  Activity:  As tolerated   Discharge Instructions     Ambulatory referral to Hematology / Oncology   Complete by: As directed    Call MD for:  difficulty breathing, headache or visual disturbances   Complete by: As directed    Call MD for:  severe uncontrolled pain   Complete by: As directed    Diet - low sodium heart healthy   Complete by: As directed    Discharge instructions   Complete by: As directed    Follow with Primary MD  Tisovec, Adelfa Koh, MD in 1-2 weeks  Please get a complete blood count and chemistry panel checked by your Primary MD at your next visit, and again as instructed by your Primary MD.  Get Medicines reviewed and adjusted: Please take all your medications with you for  your next visit with your Primary MD  Laboratory/radiological data: Please request your Primary MD to go over all hospital tests and procedure/radiological results at the follow up, please ask your Primary MD to get all Hospital records sent to his/her office.  In some cases, they will be blood work, cultures and biopsy results pending at the time of your discharge. Please request that  your primary care M.D. follows up on these results.  Also Note the following: If you experience worsening of your admission symptoms, develop shortness of breath, life threatening emergency, suicidal or homicidal thoughts you must seek medical attention immediately by calling 911 or calling your MD immediately  if symptoms less severe.  You must read complete instructions/literature along with all the possible adverse reactions/side effects for all the Medicines you take and that have been prescribed to you. Take any new Medicines after you have completely understood and accpet all the possible adverse reactions/side effects.   Do not drive when taking Pain medications or sleeping medications (Benzodaizepines)  Do not take more than prescribed Pain, Sleep and Anxiety Medications. It is not advisable to combine anxiety,sleep and pain medications without talking with your primary care practitioner  Special Instructions: If you have smoked or chewed Tobacco  in the last 2 yrs please stop smoking, stop any regular Alcohol  and or any Recreational drug use.  Wear Seat belts while driving.  Please note: You were cared for by a hospitalist during your hospital stay. Once you are discharged, your primary care physician will handle any further medical issues. Please note that NO REFILLS for any discharge medications will be authorized once you are discharged, as it is imperative that you return to your primary care physician (or establish a relationship with a primary care physician if you do not have one) for your post hospital discharge needs so that they can reassess your need for medications and monitor your lab values.   Increase activity slowly   Complete by: As directed    No wound care   Complete by: As directed       Allergies as of 06/02/2023       Reactions   Sulfa Antibiotics Itching        Medication List     TAKE these medications    apixaban 5 MG Tabs tablet Commonly known  as: ELIQUIS Take 10 mg (2 tablets) p.o. twice daily for 7 days-on 06/08/2023-switch to 5 mg (1 tablet) p.o. daily   atorvastatin 10 MG tablet Commonly known as: LIPITOR Take 10 mg by mouth daily.   losartan 50 MG tablet Commonly known as: COZAAR Take 50 mg by mouth daily.   omeprazole 40 MG capsule Commonly known as: PRILOSEC Take 1 capsule (40 mg total) by mouth in the morning and at bedtime. What changed: when to take this   propranolol 40 MG tablet Commonly known as: INDERAL Take 40 mg by mouth daily.   TURMERIC CURCUMIN PO Take 1 tablet by mouth daily.   VITAMIN E PO Take 1 capsule by mouth daily.        Follow-up Information     Tisovec, Adelfa Koh, MD. Schedule an appointment as soon as possible for a visit in 2 week(s).   Specialty: Internal Medicine Contact information: [address] [place] [address] [phone]         [facility] Follow up.   Specialty: Oncology Why: Hospital follow up, Office will call with date/time, If  you dont hear from them,please give them a call Contact information: [address] [id number] [address] [address] [phone]               Allergies  Allergen Reactions   Sulfa Antibiotics Itching     Other Procedures/Studies: VAS [place] LOWER EXTREMITY VENOUS (DVT)  Result Date: 06/01/2023  Lower Venous DVT Study Patient Name:  Leah Knight [facility]  Date of Exam:   06/01/2023 Medical Rec #: [id number]          Accession #:    [id number] Date of Birth: [birth year]          Patient Gender: F Patient Age:   40 years Exam Location:  [facility] Procedure:      VAS [place] LOWER EXTREMITY VENOUS (DVT) Referring Phys: Jonah Blue --------------------------------------------------------------------------------  Indications: Pulmonary embolism.  Risk Factors: Confirmed PE. Anticoagulation: Heparin. Limitations: Poor ultrasound/tissue interface. Comparison Study: No  prior studies. Performing Technologist: Chanda Busing RVT  Examination Guidelines: A complete evaluation includes B-mode imaging, spectral Doppler, color Doppler, and power Doppler as needed of all accessible portions of each vessel. Bilateral testing is considered an integral part of a complete examination. Limited examinations for reoccurring indications may be performed as noted. The reflux portion of the exam is performed with the patient in reverse Trendelenburg.  +---------+---------------+---------+-----------+----------+--------------+ RIGHT    CompressibilityPhasicitySpontaneityPropertiesThrombus Aging +---------+---------------+---------+-----------+----------+--------------+ CFV      Full           Yes      Yes                                 +---------+---------------+---------+-----------+----------+--------------+ SFJ      Full                                                        +---------+---------------+---------+-----------+----------+--------------+ FV Prox  Full                                                        +---------+---------------+---------+-----------+----------+--------------+ FV Mid   Full                                                        +---------+---------------+---------+-----------+----------+--------------+ FV DistalFull                                                        +---------+---------------+---------+-----------+----------+--------------+ PFV      Full                                                        +---------+---------------+---------+-----------+----------+--------------+ POP  Full           Yes      Yes                                 +---------+---------------+---------+-----------+----------+--------------+ PTV      Full                                                        +---------+---------------+---------+-----------+----------+--------------+ PERO     Full                                                         +---------+---------------+---------+-----------+----------+--------------+   +---------+---------------+---------+-----------+----------+--------------+ LEFT     CompressibilityPhasicitySpontaneityPropertiesThrombus Aging +---------+---------------+---------+-----------+----------+--------------+ CFV      Full           Yes      Yes                                 +---------+---------------+---------+-----------+----------+--------------+ SFJ      Full                                                        +---------+---------------+---------+-----------+----------+--------------+ FV Prox  Full                                                        +---------+---------------+---------+-----------+----------+--------------+ FV Mid   Full                                                        +---------+---------------+---------+-----------+----------+--------------+ FV DistalFull                                                        +---------+---------------+---------+-----------+----------+--------------+ PFV      Full                                                        +---------+---------------+---------+-----------+----------+--------------+ POP      Partial        Yes      Yes                  Acute          +---------+---------------+---------+-----------+----------+--------------+ PTV  Full                                                        +---------+---------------+---------+-----------+----------+--------------+ PERO     Partial                                      Acute          +---------+---------------+---------+-----------+----------+--------------+     Summary: RIGHT: - There is no evidence of deep vein thrombosis in the lower extremity.  - No cystic structure found in the popliteal fossa.  LEFT: - Findings consistent with acute deep vein thrombosis involving the left  popliteal vein, and left peroneal veins. - No cystic structure found in the popliteal fossa.  *See table(s) above for measurements and observations. Electronically signed by Coral Else MD on 06/01/2023 at 8:35:52 PM.    Final    ECHOCARDIOGRAM LIMITED  Result Date: 06/01/2023    ECHOCARDIOGRAM LIMITED REPORT   Patient Name:   Leah Knight [facility] Date of Exam: 06/01/2023 Medical Rec #:  [id number]         Height:       62.0 in Accession #:    [id number]        Weight:       163.0 lb Date of Birth:  [birth year]         BSA:          1.753 m Patient Age:    76 years          BP:           145/81 mmHg Patient Gender: F                 HR:           80 bpm. Exam Location:  Inpatient Procedure: 2D Echo, Cardiac Doppler, Color Doppler and Strain Analysis           STAT ECHO Reported to: Dr Lennie Odor. Indications:    Pulmonary embolism  History:        Patient has no prior history of Echocardiogram examinations.                 Stroke, Signs/Symptoms:Chest Pain and Shortness of Breath; Risk                 Factors:Hypertension, Dyslipidemia and Sleep Apnea.  Sonographer:    Milda Smart Referring Phys: [id number] JENNIFER YATES  Sonographer Comments: Image acquisition challenging due to respiratory motion. IMPRESSIONS  1. Mobile linear density 2.0 cm x 0.8 cm in the main pulmonary artery consistent with thrombus in this patient with known saddle PE.  2. Left ventricular ejection fraction, by estimation, is 70 to 75%. The left ventricle has hyperdynamic function. The left ventricle has no regional wall motion abnormalities. There is moderate concentric left ventricular hypertrophy. Left ventricular diastolic parameters are consistent with Grade I diastolic dysfunction (impaired relaxation). There is the interventricular septum is flattened in systole and diastole, consistent with right ventricular pressure and volume overload.  3. RV stain -8.1 which is abnormal. Right ventricular systolic function is moderately reduced.  The right ventricular size is severely enlarged. Tricuspid regurgitation signal is inadequate for assessing PA pressure.  4. The mitral valve  is grossly normal. No evidence of mitral valve regurgitation. No evidence of mitral stenosis.  5. The aortic valve is tricuspid. Aortic valve regurgitation is not visualized. No aortic stenosis is present.  6. The inferior vena cava is normal in size with greater than 50% respiratory variability, suggesting right atrial pressure of 3 mmHg. FINDINGS  Left Ventricle: Left ventricular ejection fraction, by estimation, is 70 to 75%. The left ventricle has hyperdynamic function. The left ventricle has no regional wall motion abnormalities. Definity contrast agent was given IV to delineate the left ventricular endocardial borders. The left ventricular internal cavity size was normal in size. There is moderate concentric left ventricular hypertrophy. The interventricular septum is flattened in systole and diastole, consistent with right ventricular pressure and volume overload. Left ventricular diastolic parameters are consistent with Grade I diastolic dysfunction (impaired relaxation). Right Ventricle: RV stain -8.1 which is abnormal. The right ventricular size is severely enlarged. No increase in right ventricular wall thickness. Right ventricular systolic function is moderately reduced. Tricuspid regurgitation signal is inadequate for assessing PA pressure. Left Atrium: Left atrial size was normal in size. Right Atrium: Right atrial size was normal in size. Pericardium: Trivial pericardial effusion is present. Mitral Valve: The mitral valve is grossly normal. No evidence of mitral valve stenosis. MV peak gradient, 3.3 mmHg. The mean mitral valve gradient is 1.0 mmHg. Tricuspid Valve: The tricuspid valve is grossly normal. Tricuspid valve regurgitation is mild . No evidence of tricuspid stenosis. Aortic Valve: The aortic valve is tricuspid. Aortic valve regurgitation is not  visualized. No aortic stenosis is present. Pulmonic Valve: The pulmonic valve was grossly normal. Pulmonic valve regurgitation is trivial. No evidence of pulmonic stenosis. Aorta: The aortic root and ascending aorta are structurally normal, with no evidence of dilitation. 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 Venous: The inferior vena cava is normal in size with greater than 50% respiratory variability, suggesting right atrial pressure of 3 mmHg. IAS/Shunts: The atrial septum is grossly normal. Additional Comments: Spectral Doppler performed. Color Doppler performed.  LEFT VENTRICLE PLAX 2D LVIDd:         2.90 cm   Diastology LVIDs:         0.90 cm   LV e' medial:    4.46 cm/s LV PW:         1.50 cm   LV E/e' medial:  14.2 LV IVS:        1.40 cm   LV e' lateral:   5.22 cm/s LVOT diam:     1.70 cm   LV E/e' lateral: 12.1 LV SV:         46 LV SV Index:   26 LVOT Area:     2.27 cm  RIGHT VENTRICLE RV S prime:     13.40 cm/s TAPSE (M-mode): 1.5 cm LEFT ATRIUM             Index        RIGHT ATRIUM           Index LA diam:        2.90 cm 1.65 cm/m   RA Area:     15.50 cm LA Vol (A2C):   26.0 ml 14.84 ml/m  RA Volume:   40.80 ml  23.28 ml/m LA Vol (A4C):   26.5 ml 15.12 ml/m LA Biplane Vol: 26.3 ml 15.01 ml/m  AORTIC VALVE LVOT Vmax:  112.00 cm/s LVOT Vmean:  75.800 cm/s LVOT VTI:    0.202 m  AORTA Ao Root diam: 3.00 cm Ao Asc diam:  3.40 cm MITRAL VALVE MV Area (PHT): 2.36 cm    SHUNTS MV Peak grad:  3.3 mmHg    Systemic VTI:  0.20 m MV Mean grad:  1.0 mmHg    Systemic Diam: 1.70 cm MV Vmax:       0.90 m/s MV Vmean:      58.1 cm/s MV Decel Time: 322 msec MV E velocity: 63.20 cm/s MV A velocity: 89.20 cm/s MV E/A ratio:  0.71 Lennie Odor MD Electronically signed by Lennie Odor MD Signature Date/Time: 06/01/2023/10:12:54 AM    Final    IR US Guide Vasc Access Right  Result Date:  05/31/2023 INDICATION: Submassive PE. Briefly, 77 year old female presenting with severe shortness of breath. ER CTA PE demonstrating saddle embolus, submassive PE. RV/LV ratio 1.4. EXAM: Procedures: 1. PULMONARY ARTERIOGRAPHY 2. CATHETER-DIRECTED MECHANICAL THROMBECTOMY COMPARISON:  Chest XR and CTA PE, 12/30/2022. MEDICATIONS: 5K IU heparin IV. ANESTHESIA/SEDATION: Moderate (conscious) sedation was employed during this procedure. A total of Versed 2 mg and Fentanyl 25 mcg was administered intravenously. Moderate Sedation Time: 75 minutes. The patient's level of consciousness and vital signs were monitored continuously by radiology nursing throughout the procedure under my direct supervision. CONTRAST:  50mL OMNIPAQUE IOHEXOL 300 MG/ML  SOLN FLUOROSCOPY TIME:  Fluoroscopic dose; 94 mGy COMPLICATIONS: None immediate. TECHNIQUE: Informed written consent was obtained from the patient and/or patient's representative after a discussion of the risks, benefits and alternatives to treatment. Questions regarding the procedure were encouraged and answered. A timeout was performed prior to the initiation of the procedure. Ultrasound scanning was performed of the RIGHT groin and demonstrated patency of the RIGHT common femoral and greater saphenous veins. The RIGHT groin was prepped and draped in the usual sterile fashion, and a sterile drape was applied covering the operative field. Maximum barrier sterile technique with sterile gowns and gloves were used for the procedure. A timeout was performed prior to the initiation of the procedure. Local anesthesia was provided with 1% lidocaine. Under direct ultrasound guidance, the RIGHT greater saphenous vein was accessed with a micro puncture kit ultimately allowing placement of a 7 Fr vascular sheath. Ultrasound and fluoroscopic spot images were saved for procedural documentation purposes. With the use of a 0.035 inch Bentson wire and an angled pigtail pulmonary catheter, access  past the RIGHT heart and into the main pulmonary artery was obtained. Pre intervention central pulmonary arteriogram was performed. The Bentson wire was exchanged for an Amplatz wire then a 16 Fr, 33 cm GORE dry seal sheath then a 16 Fr Penumbra Lightning Flash aspiration catheter was advanced into the RIGHT then LEFT pulmonary arteries and catheter directed thrombectomy was performed. Intermittent and postprocedural pulmonary arteriograms were performed during thrombus removal. Following adequate angiographic result and the catheter and sheath were then removed, and hemostasis was obtained by manual pressure. The patient tolerated the procedure well without immediate postprocedural complication. FINDINGS: *Central pulmonary arteriogram demonstrates saddle embolus and additional burden predominantly at the pulmonary arterial bifurcations, bilateral inferior lobar and the RIGHT superior lobar pulmonary artery. *Post interventional pulmonary arteriograms demonstrating adequate debulking thrombectomy from the bilateral inferior lobar pulmonary arteries. *Mild residual thrombus burden. IMPRESSION: Successful catheter-directed bilateral pulmonary arterial debulking mechanical thrombectomy. PLAN: 1. Continue systemic anticoagulation with heparin gtt and transition to DOAC when clinically feasible. 2. The patient will be seen by me in the Vascular Interventional Radiology (VIR)  clinic for postoperative follow-up, and including repeat BILATERAL lower extremity venous duplex in 1 month. Roanna Banning, MD [facility] Radiology Electronically Signed   By: Roanna Banning M.D.   On: 05/31/2023 16:29   IR THROMBECT PRIM MECH INIT (INCLU) MOD SED  Result Date: 05/31/2023 INDICATION: Submassive PE. Briefly, 77 year old female presenting with severe shortness of breath. ER CTA PE demonstrating saddle embolus, submassive PE. RV/LV ratio 1.4. EXAM: Procedures: 1. PULMONARY ARTERIOGRAPHY  2. CATHETER-DIRECTED MECHANICAL THROMBECTOMY COMPARISON:  Chest XR and CTA PE, 12/30/2022. MEDICATIONS: 5K IU heparin IV. ANESTHESIA/SEDATION: Moderate (conscious) sedation was employed during this procedure. A total of Versed 2 mg and Fentanyl 25 mcg was administered intravenously. Moderate Sedation Time: 75 minutes. The patient's level of consciousness and vital signs were monitored continuously by radiology nursing throughout the procedure under my direct supervision. CONTRAST:  50mL OMNIPAQUE IOHEXOL 300 MG/ML  SOLN FLUOROSCOPY TIME:  Fluoroscopic dose; 94 mGy COMPLICATIONS: None immediate. TECHNIQUE: Informed written consent was obtained from the patient and/or patient's representative after a discussion of the risks, benefits and alternatives to treatment. Questions regarding the procedure were encouraged and answered. A timeout was performed prior to the initiation of the procedure. Ultrasound scanning was performed of the RIGHT groin and demonstrated patency of the RIGHT common femoral and greater saphenous veins. The RIGHT groin was prepped and draped in the usual sterile fashion, and a sterile drape was applied covering the operative field. Maximum barrier sterile technique with sterile gowns and gloves were used for the procedure. A timeout was performed prior to the initiation of the procedure. Local anesthesia was provided with 1% lidocaine. Under direct ultrasound guidance, the RIGHT greater saphenous vein was accessed with a micro puncture kit ultimately allowing placement of a 7 Fr vascular sheath. Ultrasound and fluoroscopic spot images were saved for procedural documentation purposes. With the use of a 0.035 inch Bentson wire and an angled pigtail pulmonary catheter, access past the RIGHT heart and into the main pulmonary artery was obtained. Pre intervention central pulmonary arteriogram was performed. The Bentson wire was exchanged for an Amplatz wire then a 16 Fr, 33 cm GORE dry seal sheath then  a 16 Fr Penumbra Lightning Flash aspiration catheter was advanced into the RIGHT then LEFT pulmonary arteries and catheter directed thrombectomy was performed. Intermittent and postprocedural pulmonary arteriograms were performed during thrombus removal. Following adequate angiographic result and the catheter and sheath were then removed, and hemostasis was obtained by manual pressure. The patient tolerated the procedure well without immediate postprocedural complication. FINDINGS: *Central pulmonary arteriogram demonstrates saddle embolus and additional burden predominantly at the pulmonary arterial bifurcations, bilateral inferior lobar and the RIGHT superior lobar pulmonary artery. *Post interventional pulmonary arteriograms demonstrating adequate debulking thrombectomy from the bilateral inferior lobar pulmonary arteries. *Mild residual thrombus burden. IMPRESSION: Successful catheter-directed bilateral pulmonary arterial debulking mechanical thrombectomy. PLAN: 1. Continue systemic anticoagulation with heparin gtt and transition to DOAC when clinically feasible. 2. The patient will be seen by me in the Vascular Interventional Radiology (VIR) clinic for postoperative follow-up, and including repeat BILATERAL lower extremity venous duplex in 1 month. Roanna Banning, MD [facility] Radiology Electronically Signed   By: Roanna Banning M.D.   On: 05/31/2023 16:29   IR THROMBECT PRIM MECH INIT (INCLU) MOD SED  Result Date: 05/31/2023 INDICATION: Submassive PE. Briefly, 77 year old female presenting with severe shortness of breath. ER CTA PE demonstrating saddle embolus, submassive PE. RV/LV ratio 1.4. EXAM: Procedures: 1. PULMONARY ARTERIOGRAPHY  2. CATHETER-DIRECTED MECHANICAL THROMBECTOMY COMPARISON:  Chest XR and CTA PE, 12/30/2022. MEDICATIONS: 5K IU heparin IV. ANESTHESIA/SEDATION: Moderate (conscious) sedation was employed during this procedure. A total of Versed 2  mg and Fentanyl 25 mcg was administered intravenously. Moderate Sedation Time: 75 minutes. The patient's level of consciousness and vital signs were monitored continuously by radiology nursing throughout the procedure under my direct supervision. CONTRAST:  50mL OMNIPAQUE IOHEXOL 300 MG/ML  SOLN FLUOROSCOPY TIME:  Fluoroscopic dose; 94 mGy COMPLICATIONS: None immediate. TECHNIQUE: Informed written consent was obtained from the patient and/or patient's representative after a discussion of the risks, benefits and alternatives to treatment. Questions regarding the procedure were encouraged and answered. A timeout was performed prior to the initiation of the procedure. Ultrasound scanning was performed of the RIGHT groin and demonstrated patency of the RIGHT common femoral and greater saphenous veins. The RIGHT groin was prepped and draped in the usual sterile fashion, and a sterile drape was applied covering the operative field. Maximum barrier sterile technique with sterile gowns and gloves were used for the procedure. A timeout was performed prior to the initiation of the procedure. Local anesthesia was provided with 1% lidocaine. Under direct ultrasound guidance, the RIGHT greater saphenous vein was accessed with a micro puncture kit ultimately allowing placement of a 7 Fr vascular sheath. Ultrasound and fluoroscopic spot images were saved for procedural documentation purposes. With the use of a 0.035 inch Bentson wire and an angled pigtail pulmonary catheter, access past the RIGHT heart and into the main pulmonary artery was obtained. Pre intervention central pulmonary arteriogram was performed. The Bentson wire was exchanged for an Amplatz wire then a 16 Fr, 33 cm GORE dry seal sheath then a 16 Fr Penumbra Lightning Flash aspiration catheter was advanced into the RIGHT then LEFT pulmonary arteries and catheter directed thrombectomy was performed. Intermittent and postprocedural pulmonary arteriograms were  performed during thrombus removal. Following adequate angiographic result and the catheter and sheath were then removed, and hemostasis was obtained by manual pressure. The patient tolerated the procedure well without immediate postprocedural complication. FINDINGS: *Central pulmonary arteriogram demonstrates saddle embolus and additional burden predominantly at the pulmonary arterial bifurcations, bilateral inferior lobar and the RIGHT superior lobar pulmonary artery. *Post interventional pulmonary arteriograms demonstrating adequate debulking thrombectomy from the bilateral inferior lobar pulmonary arteries. *Mild residual thrombus burden. IMPRESSION: Successful catheter-directed bilateral pulmonary arterial debulking mechanical thrombectomy. PLAN: 1. Continue systemic anticoagulation with heparin gtt and transition to DOAC when clinically feasible. 2. The patient will be seen by me in the Vascular Interventional Radiology (VIR) clinic for postoperative follow-up, and including repeat BILATERAL lower extremity venous duplex in 1 month. Roanna Banning, MD [facility] Radiology Electronically Signed   By: Roanna Banning M.D.   On: 05/31/2023 16:29   IR Angiogram Pulmonary Bilateral Selective  Result Date: 05/31/2023 INDICATION: Submassive PE. Briefly, 77 year old female presenting with severe shortness of breath. ER CTA PE demonstrating saddle embolus, submassive PE. RV/LV ratio 1.4. EXAM: Procedures: 1. PULMONARY ARTERIOGRAPHY 2. CATHETER-DIRECTED MECHANICAL THROMBECTOMY COMPARISON:  Chest XR and CTA PE, 12/30/2022. MEDICATIONS: 5K IU heparin IV. ANESTHESIA/SEDATION: Moderate (conscious) sedation was employed during this procedure. A total of Versed 2 mg and Fentanyl 25 mcg was administered intravenously. Moderate Sedation Time: 75 minutes. The patient's level of consciousness and vital signs were monitored continuously by radiology nursing throughout the procedure  under my direct supervision. CONTRAST:  50mL OMNIPAQUE IOHEXOL 300 MG/ML  SOLN FLUOROSCOPY TIME:  Fluoroscopic dose; 94 mGy COMPLICATIONS: None  immediate. TECHNIQUE: Informed written consent was obtained from the patient and/or patient's representative after a discussion of the risks, benefits and alternatives to treatment. Questions regarding the procedure were encouraged and answered. A timeout was performed prior to the initiation of the procedure. Ultrasound scanning was performed of the RIGHT groin and demonstrated patency of the RIGHT common femoral and greater saphenous veins. The RIGHT groin was prepped and draped in the usual sterile fashion, and a sterile drape was applied covering the operative field. Maximum barrier sterile technique with sterile gowns and gloves were used for the procedure. A timeout was performed prior to the initiation of the procedure. Local anesthesia was provided with 1% lidocaine. Under direct ultrasound guidance, the RIGHT greater saphenous vein was accessed with a micro puncture kit ultimately allowing placement of a 7 Fr vascular sheath. Ultrasound and fluoroscopic spot images were saved for procedural documentation purposes. With the use of a 0.035 inch Bentson wire and an angled pigtail pulmonary catheter, access past the RIGHT heart and into the main pulmonary artery was obtained. Pre intervention central pulmonary arteriogram was performed. The Bentson wire was exchanged for an Amplatz wire then a 16 Fr, 33 cm GORE dry seal sheath then a 16 Fr Penumbra Lightning Flash aspiration catheter was advanced into the RIGHT then LEFT pulmonary arteries and catheter directed thrombectomy was performed. Intermittent and postprocedural pulmonary arteriograms were performed during thrombus removal. Following adequate angiographic result and the catheter and sheath were then removed, and hemostasis was obtained by manual pressure. The patient tolerated the procedure well without  immediate postprocedural complication. FINDINGS: *Central pulmonary arteriogram demonstrates saddle embolus and additional burden predominantly at the pulmonary arterial bifurcations, bilateral inferior lobar and the RIGHT superior lobar pulmonary artery. *Post interventional pulmonary arteriograms demonstrating adequate debulking thrombectomy from the bilateral inferior lobar pulmonary arteries. *Mild residual thrombus burden. IMPRESSION: Successful catheter-directed bilateral pulmonary arterial debulking mechanical thrombectomy. PLAN: 1. Continue systemic anticoagulation with heparin gtt and transition to DOAC when clinically feasible. 2. The patient will be seen by me in the Vascular Interventional Radiology (VIR) clinic for postoperative follow-up, and including repeat BILATERAL lower extremity venous duplex in 1 month. Roanna Banning, MD [facility] Radiology Electronically Signed   By: Roanna Banning M.D.   On: 05/31/2023 16:29   IR Angiogram Selective Each Additional Vessel  Result Date: 05/31/2023 INDICATION: Submassive PE. Briefly, 77 year old female presenting with severe shortness of breath. ER CTA PE demonstrating saddle embolus, submassive PE. RV/LV ratio 1.4. EXAM: Procedures: 1. PULMONARY ARTERIOGRAPHY 2. CATHETER-DIRECTED MECHANICAL THROMBECTOMY COMPARISON:  Chest XR and CTA PE, 12/30/2022. MEDICATIONS: 5K IU heparin IV. ANESTHESIA/SEDATION: Moderate (conscious) sedation was employed during this procedure. A total of Versed 2 mg and Fentanyl 25 mcg was administered intravenously. Moderate Sedation Time: 75 minutes. The patient's level of consciousness and vital signs were monitored continuously by radiology nursing throughout the procedure under my direct supervision. CONTRAST:  50mL OMNIPAQUE IOHEXOL 300 MG/ML  SOLN FLUOROSCOPY TIME:  Fluoroscopic dose; 94 mGy COMPLICATIONS: None immediate. TECHNIQUE: Informed written consent was obtained from the  patient and/or patient's representative after a discussion of the risks, benefits and alternatives to treatment. Questions regarding the procedure were encouraged and answered. A timeout was performed prior to the initiation of the procedure. Ultrasound scanning was performed of the RIGHT groin and demonstrated patency of the RIGHT common femoral and greater saphenous veins. The RIGHT groin was prepped and draped in the usual sterile fashion, and a sterile drape was applied covering the  operative field. Maximum barrier sterile technique with sterile gowns and gloves were used for the procedure. A timeout was performed prior to the initiation of the procedure. Local anesthesia was provided with 1% lidocaine. Under direct ultrasound guidance, the RIGHT greater saphenous vein was accessed with a micro puncture kit ultimately allowing placement of a 7 Fr vascular sheath. Ultrasound and fluoroscopic spot images were saved for procedural documentation purposes. With the use of a 0.035 inch Bentson wire and an angled pigtail pulmonary catheter, access past the RIGHT heart and into the main pulmonary artery was obtained. Pre intervention central pulmonary arteriogram was performed. The Bentson wire was exchanged for an Amplatz wire then a 16 Fr, 33 cm GORE dry seal sheath then a 16 Fr Penumbra Lightning Flash aspiration catheter was advanced into the RIGHT then LEFT pulmonary arteries and catheter directed thrombectomy was performed. Intermittent and postprocedural pulmonary arteriograms were performed during thrombus removal. Following adequate angiographic result and the catheter and sheath were then removed, and hemostasis was obtained by manual pressure. The patient tolerated the procedure well without immediate postprocedural complication. FINDINGS: *Central pulmonary arteriogram demonstrates saddle embolus and additional burden predominantly at the pulmonary arterial bifurcations, bilateral inferior lobar and the  RIGHT superior lobar pulmonary artery. *Post interventional pulmonary arteriograms demonstrating adequate debulking thrombectomy from the bilateral inferior lobar pulmonary arteries. *Mild residual thrombus burden. IMPRESSION: Successful catheter-directed bilateral pulmonary arterial debulking mechanical thrombectomy. PLAN: 1. Continue systemic anticoagulation with heparin gtt and transition to DOAC when clinically feasible. 2. The patient will be seen by me in the Vascular Interventional Radiology (VIR) clinic for postoperative follow-up, and including repeat BILATERAL lower extremity venous duplex in 1 month. Roanna Banning, MD [facility]iology [facility] Radiology Electronically Signed   By: Roanna Banning M.D.   On: 05/31/2023 16:29   IR Angiogram Selective Each Additional Vessel  Result Date: 05/31/2023 INDICATION: Submassive PE. Briefly, 77 year old female presenting with severe shortness of breath. ER CTA PE demonstrating saddle embolus, submassive PE. RV/LV ratio 1.4. EXAM: Procedures: 1. PULMONARY ARTERIOGRAPHY 2. CATHETER-DIRECTED MECHANICAL THROMBECTOMY COMPARISON:  Chest XR and CTA PE, 12/30/2022. MEDICATIONS: 5K IU heparin IV. ANESTHESIA/SEDATION: Moderate (conscious) sedation was employed during this procedure. A total of Versed 2 mg and Fentanyl 25 mcg was administered intravenously. Moderate Sedation Time: 75 minutes. The patient's level of consciousness and vital signs were monitored continuously by radiology nursing throughout the procedure under my direct supervision. CONTRAST:  50mL OMNIPAQUE IOHEXOL 300 MG/ML  SOLN FLUOROSCOPY TIME:  Fluoroscopic dose; 94 mGy COMPLICATIONS: None immediate. TECHNIQUE: Informed written consent was obtained from the patient and/or patient's representative after a discussion of the risks, benefits and alternatives to treatment. Questions regarding the procedure were encouraged and answered. A timeout was performed prior to the  initiation of the procedure. Ultrasound scanning was performed of the RIGHT groin and demonstrated patency of the RIGHT common femoral and greater saphenous veins. The RIGHT groin was prepped and draped in the usual sterile fashion, and a sterile drape was applied covering the operative field. Maximum barrier sterile technique with sterile gowns and gloves were used for the procedure. A timeout was performed prior to the initiation of the procedure. Local anesthesia was provided with 1% lidocaine. Under direct ultrasound guidance, the RIGHT greater saphenous vein was accessed with a micro puncture kit ultimately allowing placement of a 7 Fr vascular sheath. Ultrasound and fluoroscopic spot images were saved for procedural documentation purposes. With the use of a 0.035 inch Bentson wire and an angled pigtail  pulmonary catheter, access past the RIGHT heart and into the main pulmonary artery was obtained. Pre intervention central pulmonary arteriogram was performed. The Bentson wire was exchanged for an Amplatz wire then a 16 Fr, 33 cm GORE dry seal sheath then a 16 Fr Penumbra Lightning Flash aspiration catheter was advanced into the RIGHT then LEFT pulmonary arteries and catheter directed thrombectomy was performed. Intermittent and postprocedural pulmonary arteriograms were performed during thrombus removal. Following adequate angiographic result and the catheter and sheath were then removed, and hemostasis was obtained by manual pressure. The patient tolerated the procedure well without immediate postprocedural complication. FINDINGS: *Central pulmonary arteriogram demonstrates saddle embolus and additional burden predominantly at the pulmonary arterial bifurcations, bilateral inferior lobar and the RIGHT superior lobar pulmonary artery. *Post interventional pulmonary arteriograms demonstrating adequate debulking thrombectomy from the bilateral inferior lobar pulmonary arteries. *Mild residual thrombus burden.  IMPRESSION: Successful catheter-directed bilateral pulmonary arterial debulking mechanical thrombectomy. PLAN: 1. Continue systemic anticoagulation with heparin gtt and transition to DOAC when clinically feasible. 2. The patient will be seen by me in the Vascular Interventional Radiology (VIR) clinic for postoperative follow-up, and including repeat BILATERAL lower extremity venous duplex in 1 month. Roanna Banning, MD [facility] Interventional Radiology Special[facility] Radiology Electronically Signed   By: Roanna Banning M.D.   On: 05/31/2023 16:29   CT Angio Chest PE W and/or Wo Contrast  Result Date: 05/31/2023 CLINICAL DATA:  Shortness of breath. EXAM: CT ANGIOGRAPHY CHEST WITH CONTRAST TECHNIQUE: Multidetector CT imaging of the chest was performed using the standard protocol during bolus administration of intravenous contrast. Multiplanar CT image reconstructions and MIPs were obtained to evaluate the vascular anatomy. RADIATION DOSE REDUCTION: This exam was performed according to the departmental dose-optimization program which includes automated exposure control, adjustment of the mA and/or kV according to patient size and/or use of iterative reconstruction technique. CONTRAST:  75mL OMNIPAQUE IOHEXOL 350 MG/ML SOLN COMPARISON:  Radiograph of same day. FINDINGS: Cardiovascular: Large saddle embolus is noted which extends into lower lobe branches of both pulmonary arteries. RV/LV ratio of 1.4 is noted suggesting right heart strain. No pericardial effusion. Coronary artery calcifications are noted. Mediastinum/Nodes: Small sliding-type hiatal hernia. No adenopathy is noted. Thyroid gland is unremarkable. Lungs/Pleura: Lungs are clear. No pleural effusion or pneumothorax. Upper Abdomen: No acute abnormality. Musculoskeletal: No chest wall abnormality. No acute or significant osseous findings. Review of the MIP images confirms the above findings. IMPRESSION: Large saddle type pulmonary embolus is noted  which extends into lower lobe branches of both pulmonary arteries. Positive for acute PE with CT evidence of right heart strain (RV/LV Ratio = 1.4) consistent with at least submassive (intermediate risk) PE. The presence of right heart strain has been associated with an increased risk of morbidity and mortality. Please refer to the "Code PE Focused" order set in EPIC. Critical Value/emergent results were called by telephone at the time of interpretation on 05/31/2023 at 10:39 am to provider [facility] , who verbally acknowledged these results. Small sliding-type hiatal hernia. Coronary artery calcifications are noted. Electronically Signed   By: Lupita Raider M.D.   On: 05/31/2023 10:39   DG Chest Port 1 View  Result Date: 05/31/2023 CLINICAL DATA:  Dyspnea EXAM: PORTABLE CHEST 1 VIEW COMPARISON:  None Available. FINDINGS: Normal heart size. Normal mediastinal contour. No pneumothorax. No pleural effusion. Lungs appear clear, with no acute consolidative airspace disease and no pulmonary edema. IMPRESSION: No active disease. Electronically Signed   By: Jannifer Rodney.D.  On: 05/31/2023 08:53     TODAY-DAY OF DISCHARGE:  Subjective:   Leah Knight today has no headache,no chest abdominal pain,no new weakness tingling or numbness, feels much better wants to go home today.   Objective:   Blood pressure (!) 158/91, pulse 91, temperature 98.9 F (37.2 C), temperature source Oral, resp. rate 19, height 5\' 2"  (1.575 m), weight 73.9 kg, SpO2 97 %.  Intake/Output Summary (Last 24 hours) at 06/02/2023 1010 Last data filed at 06/01/2023 2100 Gross per 24 hour  Intake 603 ml  Output --  Net 603 ml   Filed Weights   05/31/23 1055  Weight: 73.9 kg    Exam: Awake Alert, Oriented *3, No new F.N deficits, Normal affect [place].AT,PERRAL Supple Neck,No JVD, No cervical lymphadenopathy appriciated.  Symmetrical Chest wall movement, Good air movement bilaterally, CTAB RRR,No Gallops,Rubs or new  Murmurs, No Parasternal Heave +ve B.Sounds, Abd Soft, Non tender, No organomegaly appriciated, No rebound -guarding or rigidity. No Cyanosis, Clubbing or edema, No new Rash or bruise   PERTINENT RADIOLOGIC STUDIES: VAS [place] LOWER EXTREMITY VENOUS (DVT)  Result Date: 06/01/2023  Lower Venous DVT Study Patient Name:  Leah Knight [facility]  Date of Exam:   06/01/2023 Medical Rec #: [id number]          Accession #:    [id number] Date of Birth: 11-07-1946          Patient Gender: F Patient Age:   33 years Exam Location:  [facility] Procedure:      VAS [place] LOWER EXTREMITY VENOUS (DVT) Referring Phys: Jonah Blue --------------------------------------------------------------------------------  Indications: Pulmonary embolism.  Risk Factors: Confirmed PE. Anticoagulation: Heparin. Limitations: Poor ultrasound/tissue interface. Comparison Study: No prior studies. Performing Technologist: Chanda Busing RVT  Examination Guidelines: A complete evaluation includes B-mode imaging, spectral Doppler, color Doppler, and power Doppler as needed of all accessible portions of each vessel. Bilateral testing is considered an integral part of a complete examination. Limited examinations for reoccurring indications may be performed as noted. The reflux portion of the exam is performed with the patient in reverse Trendelenburg.  +---------+---------------+---------+-----------+----------+--------------+ RIGHT    CompressibilityPhasicitySpontaneityPropertiesThrombus Aging +---------+---------------+---------+-----------+----------+--------------+ CFV      Full           Yes      Yes                                 +---------+---------------+---------+-----------+----------+--------------+ SFJ      Full                                                        +---------+---------------+---------+-----------+----------+--------------+ FV Prox  Full                                                         +---------+---------------+---------+-----------+----------+--------------+ FV Mid   Full                                                        +---------+---------------+---------+-----------+----------+--------------+  FV DistalFull                                                        +---------+---------------+---------+-----------+----------+--------------+ PFV      Full                                                        +---------+---------------+---------+-----------+----------+--------------+ POP      Full           Yes      Yes                                 +---------+---------------+---------+-----------+----------+--------------+ PTV      Full                                                        +---------+---------------+---------+-----------+----------+--------------+ PERO     Full                                                        +---------+---------------+---------+-----------+----------+--------------+   +---------+---------------+---------+-----------+----------+--------------+ LEFT     CompressibilityPhasicitySpontaneityPropertiesThrombus Aging +---------+---------------+---------+-----------+----------+--------------+ CFV      Full           Yes      Yes                                 +---------+---------------+---------+-----------+----------+--------------+ SFJ      Full                                                        +---------+---------------+---------+-----------+----------+--------------+ FV Prox  Full                                                        +---------+---------------+---------+-----------+----------+--------------+ FV Mid   Full                                                        +---------+---------------+---------+-----------+----------+--------------+ FV DistalFull                                                         +---------+---------------+---------+-----------+----------+--------------+  PFV      Full                                                        +---------+---------------+---------+-----------+----------+--------------+ POP      Partial        Yes      Yes                  Acute          +---------+---------------+---------+-----------+----------+--------------+ PTV      Full                                                        +---------+---------------+---------+-----------+----------+--------------+ PERO     Partial                                      Acute          +---------+---------------+---------+-----------+----------+--------------+     Summary: RIGHT: - There is no evidence of deep vein thrombosis in the lower extremity.  - No cystic structure found in the popliteal fossa.  LEFT: - Findings consistent with acute deep vein thrombosis involving the left popliteal vein, and left peroneal veins. - No cystic structure found in the popliteal fossa.  *See table(s) above for measurements and observations. Electronically signed by Coral Else MD on 06/01/2023 at 8:35:52 PM.    Final    ECHOCARDIOGRAM LIMITED  Result Date: 06/01/2023    ECHOCARDIOGRAM LIMITED REPORT   Patient Name:   Leah Knight [facility] Date of Exam: 06/01/2023 Medical Rec #:  [id number]         Height:       62.0 in Accession #:    [id number]        Weight:       163.0 lb Date of Birth:  12-25-1945         BSA:          1.753 m Patient Age:    76 years          BP:           145/81 mmHg Patient Gender: F                 HR:           80 bpm. Exam Location:  Inpatient Procedure: 2D Echo, Cardiac Doppler, Color Doppler and Strain Analysis           STAT ECHO Reported to: Dr Lennie Odor. Indications:    Pulmonary embolism  History:        Patient has no prior history of Echocardiogram examinations.                 Stroke, Signs/Symptoms:Chest Pain and Shortness of Breath; Risk                 Factors:Hypertension,  Dyslipidemia and Sleep Apnea.  Sonographer:    Milda Smart Referring Phys: [id number] JENNIFER YATES  Sonographer Comments: Image acquisition challenging due to respiratory motion. IMPRESSIONS  1. 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  2. Left ventricular ejection fraction, by estimation, is 70 to 75%. The left ventricle has hyperdynamic function. The left ventricle has no regional wall motion abnormalities. There is moderate concentric left ventricular hypertrophy. Left ventricular diastolic parameters are consistent with Grade I diastolic dysfunction (impaired relaxation). There is the interventricular septum is flattened in systole and diastole, consistent with right ventricular pressure and volume overload.  3. RV stain -8.1 which is abnormal. Right ventricular systolic function is moderately reduced. The right ventricular size is severely enlarged. Tricuspid regurgitation signal is inadequate for assessing PA pressure.  4. The mitral valve is grossly normal. No evidence of mitral valve regurgitation. No evidence of mitral stenosis.  5. The aortic valve is tricuspid. Aortic valve regurgitation is not visualized. No aortic stenosis is present.  6. The inferior vena cava is normal in size with greater than 50% respiratory variability, suggesting right atrial pressure of 3 mmHg. FINDINGS  Left Ventricle: Left ventricular ejection fraction, by estimation, is 70 to 75%. The left ventricle has hyperdynamic function. The left ventricle has no regional wall motion abnormalities. Definity contrast agent was given IV to delineate the left ventricular endocardial borders. The left ventricular internal cavity size was normal in size. There is moderate concentric left ventricular hypertrophy. The interventricular septum is flattened in systole and diastole, consistent with right ventricular pressure and volume overload. Left ventricular  diastolic parameters are consistent with Grade I diastolic dysfunction (impaired relaxation). Right Ventricle: RV stain -8.1 which is abnormal. The right ventricular size is severely enlarged. No increase in right ventricular wall thickness. Right ventricular systolic function is moderately reduced. Tricuspid regurgitation signal is inadequate for assessing PA pressure. Left Atrium: Left atrial size was normal in size. Right Atrium: Right atrial size was normal in size. Pericardium: Trivial pericardial effusion is present. Mitral Valve: The mitral valve is grossly normal. No evidence of mitral valve stenosis. MV peak gradient, 3.3 mmHg. The mean mitral valve gradient is 1.0 mmHg. Tricuspid Valve: The tricuspid valve is grossly normal. Tricuspid valve regurgitation is mild . No evidence of tricuspid stenosis. Aortic Valve: The aortic valve is tricuspid. Aortic valve regurgitation is not visualized. No aortic stenosis is present. Pulmonic Valve: The pulmonic valve was grossly normal. Pulmonic valve regurgitation is trivial. No evidence of pulmonic stenosis. Aorta: The aortic root and ascending aorta are structurally normal, with no evidence of dilitation. 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 Venous: The inferior vena cava is normal in size with greater than 50% respiratory variability, suggesting right atrial pressure of 3 mmHg. IAS/Shunts: The atrial septum is grossly normal. Additional Comments: Spectral Doppler performed. Color Doppler performed.  LEFT VENTRICLE PLAX 2D LVIDd:         2.90 cm   Diastology LVIDs:         0.90 cm   LV e' medial:    4.46 cm/s LV PW:         1.50 cm   LV E/e' medial:  14.2 LV IVS:        1.40 cm   LV e' lateral:   5.22 cm/s LVOT diam:     1.70 cm   LV E/e' lateral: 12.1 LV SV:         46 LV SV Index:   26 LVOT Area:     2.27  cm  RIGHT VENTRICLE RV S prime:     13.40 cm/s  TAPSE (M-mode): 1.5 cm LEFT ATRIUM             Index        RIGHT ATRIUM           Index LA diam:        2.90 cm 1.65 cm/m   RA Area:     15.50 cm LA Vol (A2C):   26.0 ml 14.84 ml/m  RA Volume:   40.80 ml  23.28 ml/m LA Vol (A4C):   26.5 ml 15.12 ml/m LA Biplane Vol: 26.3 ml 15.01 ml/m  AORTIC VALVE LVOT Vmax:   112.00 cm/s LVOT Vmean:  75.800 cm/s LVOT VTI:    0.202 m  AORTA Ao Root diam: 3.00 cm Ao Asc diam:  3.40 cm MITRAL VALVE MV Area (PHT): 2.36 cm    SHUNTS MV Peak grad:  3.3 mmHg    Systemic VTI:  0.20 m MV Mean grad:  1.0 mmHg    Systemic Diam: 1.70 cm MV Vmax:       0.90 m/s MV Vmean:      58.1 cm/s MV Decel Time: 322 msec MV E velocity: 63.20 cm/s MV A velocity: 89.20 cm/s MV E/A ratio:  0.71 Lennie Odor MD Electronically signed by Lennie Odor MD Signature Date/Time: 06/01/2023/10:12:54 AM    Final    IR US Guide Vasc Access Right  Result Date: 05/31/2023 INDICATION: Submassive PE. Briefly, 77 year old female presenting with severe shortness of breath. ER CTA PE demonstrating saddle embolus, submassive PE. RV/LV ratio 1.4. EXAM: Procedures: 1. PULMONARY ARTERIOGRAPHY 2. CATHETER-DIRECTED MECHANICAL THROMBECTOMY COMPARISON:  Chest XR and CTA PE, 12/30/2022. MEDICATIONS: 5K IU heparin IV. ANESTHESIA/SEDATION: Moderate (conscious) sedation was employed during this procedure. A total of Versed 2 mg and Fentanyl 25 mcg was administered intravenously. Moderate Sedation Time: 75 minutes. The patient's level of consciousness and vital signs were monitored continuously by radiology nursing throughout the procedure under my direct supervision. CONTRAST:  50mL OMNIPAQUE IOHEXOL 300 MG/ML  SOLN FLUOROSCOPY TIME:  Fluoroscopic dose; 94 mGy COMPLICATIONS: None immediate. TECHNIQUE: Informed written consent was obtained from the patient and/or patient's representative after a discussion of the risks, benefits and alternatives to treatment. Questions regarding the procedure were encouraged and answered. A  timeout was performed prior to the initiation of the procedure. Ultrasound scanning was performed of the RIGHT groin and demonstrated patency of the RIGHT common femoral and greater saphenous veins. The RIGHT groin was prepped and draped in the usual sterile fashion, and a sterile drape was applied covering the operative field. Maximum barrier sterile technique with sterile gowns and gloves were used for the procedure. A timeout was performed prior to the initiation of the procedure. Local anesthesia was provided with 1% lidocaine. Under direct ultrasound guidance, the RIGHT greater saphenous vein was accessed with a micro puncture kit ultimately allowing placement of a 7 Fr vascular sheath. Ultrasound and fluoroscopic spot images were saved for procedural documentation purposes. With the use of a 0.035 inch Bentson wire and an angled pigtail pulmonary catheter, access past the RIGHT heart and into the main pulmonary artery was obtained. Pre intervention central pulmonary arteriogram was performed. The Bentson wire was exchanged for an Amplatz wire then a 16 Fr, 33 cm GORE dry seal sheath then a 16 Fr Penumbra Lightning Flash aspiration catheter was advanced into the RIGHT then LEFT pulmonary arteries and catheter directed thrombectomy was performed. Intermittent and postprocedural pulmonary arteriograms  were performed during thrombus removal. Following adequate angiographic result and the catheter and sheath were then removed, and hemostasis was obtained by manual pressure. The patient tolerated the procedure well without immediate postprocedural complication. FINDINGS: *Central pulmonary arteriogram demonstrates saddle embolus and additional burden predominantly at the pulmonary arterial bifurcations, bilateral inferior lobar and the RIGHT superior lobar pulmonary artery. *Post interventional pulmonary arteriograms demonstrating adequate debulking thrombectomy from the bilateral inferior lobar pulmonary arteries.  *Mild residual thrombus burden. IMPRESSION: Successful catheter-directed bilateral pulmonary arterial debulking mechanical thrombectomy. PLAN: 1. Continue systemic anticoagulation with heparin gtt and transition to DOAC when clinically feasible. 2. The patient will be seen by me in the Vascular Interventional Radiology (VIR) clinic for postoperative follow-up, and including repeat BILATERAL lower extremity venous duplex in 1 month. Roanna Banning, MD [facility] Radiology Electronically Signed   By: Roanna Banning M.D.   On: 05/31/2023 16:29   IR THROMBECT PRIM MECH INIT (INCLU) MOD SED  Result Date: 05/31/2023 INDICATION: Submassive PE. Briefly, 77 year old female presenting with severe shortness of breath. ER CTA PE demonstrating saddle embolus, submassive PE. RV/LV ratio 1.4. EXAM: Procedures: 1. PULMONARY ARTERIOGRAPHY 2. CATHETER-DIRECTED MECHANICAL THROMBECTOMY COMPARISON:  Chest XR and CTA PE, 12/30/2022. MEDICATIONS: 5K IU heparin IV. ANESTHESIA/SEDATION: Moderate (conscious) sedation was employed during this procedure. A total of Versed 2 mg and Fentanyl 25 mcg was administered intravenously. Moderate Sedation Time: 75 minutes. The patient's level of consciousness and vital signs were monitored continuously by radiology nursing throughout the procedure under my direct supervision. CONTRAST:  50mL OMNIPAQUE IOHEXOL 300 MG/ML  SOLN FLUOROSCOPY TIME:  Fluoroscopic dose; 94 mGy COMPLICATIONS: None immediate. TECHNIQUE: Informed written consent was obtained from the patient and/or patient's representative after a discussion of the risks, benefits and alternatives to treatment. Questions regarding the procedure were encouraged and answered. A timeout was performed prior to the initiation of the procedure. Ultrasound scanning was performed of the RIGHT groin and demonstrated patency of the RIGHT common femoral and greater saphenous veins. The RIGHT groin was prepped  and draped in the usual sterile fashion, and a sterile drape was applied covering the operative field. Maximum barrier sterile technique with sterile gowns and gloves were used for the procedure. A timeout was performed prior to the initiation of the procedure. Local anesthesia was provided with 1% lidocaine. Under direct ultrasound guidance, the RIGHT greater saphenous vein was accessed with a micro puncture kit ultimately allowing placement of a 7 Fr vascular sheath. Ultrasound and fluoroscopic spot images were saved for procedural documentation purposes. With the use of a 0.035 inch Bentson wire and an angled pigtail pulmonary catheter, access past the RIGHT heart and into the main pulmonary artery was obtained. Pre intervention central pulmonary arteriogram was performed. The Bentson wire was exchanged for an Amplatz wire then a 16 Fr, 33 cm GORE dry seal sheath then a 16 Fr Penumbra Lightning Flash aspiration catheter was advanced into the RIGHT then LEFT pulmonary arteries and catheter directed thrombectomy was performed. Intermittent and postprocedural pulmonary arteriograms were performed during thrombus removal. Following adequate angiographic result and the catheter and sheath were then removed, and hemostasis was obtained by manual pressure. The patient tolerated the procedure well without immediate postprocedural complication. FINDINGS: *Central pulmonary arteriogram demonstrates saddle embolus and additional burden predominantly at the pulmonary arterial bifurcations, bilateral inferior lobar and the RIGHT superior lobar pulmonary artery. *Post interventional pulmonary arteriograms demonstrating adequate debulking thrombectomy from the bilateral inferior lobar pulmonary arteries. *Mild residual thrombus burden. IMPRESSION: Successful  catheter-directed bilateral pulmonary arterial debulking mechanical thrombectomy. PLAN: 1. Continue systemic anticoagulation with heparin gtt and transition to DOAC when  clinically feasible. 2. The patient will be seen by me in the Vascular Interventional Radiology (VIR) clinic for postoperative follow-up, and including repeat BILATERAL lower extremity venous duplex in 1 month. Roanna Banning, MD [facility] Radiology Electronically Signed   By: Roanna Banning M.D.   On: 05/31/2023 16:29   IR THROMBECT PRIM MECH INIT (INCLU) MOD SED  Result Date: 05/31/2023 INDICATION: Submassive PE. Briefly, 77 year old female presenting with severe shortness of breath. ER CTA PE demonstrating saddle embolus, submassive PE. RV/LV ratio 1.4. EXAM: Procedures: 1. PULMONARY ARTERIOGRAPHY 2. CATHETER-DIRECTED MECHANICAL THROMBECTOMY COMPARISON:  Chest XR and CTA PE, 12/30/2022. MEDICATIONS: 5K IU heparin IV. ANESTHESIA/SEDATION: Moderate (conscious) sedation was employed during this procedure. A total of Versed 2 mg and Fentanyl 25 mcg was administered intravenously. Moderate Sedation Time: 75 minutes. The patient's level of consciousness and vital signs were monitored continuously by radiology nursing throughout the procedure under my direct supervision. CONTRAST:  50mL OMNIPAQUE IOHEXOL 300 MG/ML  SOLN FLUOROSCOPY TIME:  Fluoroscopic dose; 94 mGy COMPLICATIONS: None immediate. TECHNIQUE: Informed written consent was obtained from the patient and/or patient's representative after a discussion of the risks, benefits and alternatives to treatment. Questions regarding the procedure were encouraged and answered. A timeout was performed prior to the initiation of the procedure. Ultrasound scanning was performed of the RIGHT groin and demonstrated patency of the RIGHT common femoral and greater saphenous veins. The RIGHT groin was prepped and draped in the usual sterile fashion, and a sterile drape was applied covering the operative field. Maximum barrier sterile technique with sterile gowns and gloves were used for the procedure. A timeout was performed prior  to the initiation of the procedure. Local anesthesia was provided with 1% lidocaine. Under direct ultrasound guidance, the RIGHT greater saphenous vein was accessed with a micro puncture kit ultimately allowing placement of a 7 Fr vascular sheath. Ultrasound and fluoroscopic spot images were saved for procedural documentation purposes. With the use of a 0.035 inch Bentson wire and an angled pigtail pulmonary catheter, access past the RIGHT heart and into the main pulmonary artery was obtained. Pre intervention central pulmonary arteriogram was performed. The Bentson wire was exchanged for an Amplatz wire then a 16 Fr, 33 cm GORE dry seal sheath then a 16 Fr Penumbra Lightning Flash aspiration catheter was advanced into the RIGHT then LEFT pulmonary arteries and catheter directed thrombectomy was performed. Intermittent and postprocedural pulmonary arteriograms were performed during thrombus removal. Following adequate angiographic result and the catheter and sheath were then removed, and hemostasis was obtained by manual pressure. The patient tolerated the procedure well without immediate postprocedural complication. FINDINGS: *Central pulmonary arteriogram demonstrates saddle embolus and additional burden predominantly at the pulmonary arterial bifurcations, bilateral inferior lobar and the RIGHT superior lobar pulmonary artery. *Post interventional pulmonary arteriograms demonstrating adequate debulking thrombectomy from the bilateral inferior lobar pulmonary arteries. *Mild residual thrombus burden. IMPRESSION: Successful catheter-directed bilateral pulmonary arterial debulking mechanical thrombectomy. PLAN: 1. Continue systemic anticoagulation with heparin gtt and transition to DOAC when clinically feasible. 2. The patient will be seen by me in the Vascular Interventional Radiology (VIR) clinic for postoperative follow-up, and including repeat BILATERAL lower extremity venous duplex in 1 month. Roanna Banning, MD  [facility] Radiology Electronically Signed   By: Roanna Banning M.D.   On: 05/31/2023 16:29   IR Angiogram Pulmonary  Bilateral Selective  Result Date: 05/31/2023 INDICATION: Submassive PE. Briefly, 77 year old female presenting with severe shortness of breath. ER CTA PE demonstrating saddle embolus, submassive PE. RV/LV ratio 1.4. EXAM: Procedures: 1. PULMONARY ARTERIOGRAPHY 2. CATHETER-DIRECTED MECHANICAL THROMBECTOMY COMPARISON:  Chest XR and CTA PE, 12/30/2022. MEDICATIONS: 5K IU heparin IV. ANESTHESIA/SEDATION: Moderate (conscious) sedation was employed during this procedure. A total of Versed 2 mg and Fentanyl 25 mcg was administered intravenously. Moderate Sedation Time: 75 minutes. The patient's level of consciousness and vital signs were monitored continuously by radiology nursing throughout the procedure under my direct supervision. CONTRAST:  50mL OMNIPAQUE IOHEXOL 300 MG/ML  SOLN FLUOROSCOPY TIME:  Fluoroscopic dose; 94 mGy COMPLICATIONS: None immediate. TECHNIQUE: Informed written consent was obtained from the patient and/or patient's representative after a discussion of the risks, benefits and alternatives to treatment. Questions regarding the procedure were encouraged and answered. A timeout was performed prior to the initiation of the procedure. Ultrasound scanning was performed of the RIGHT groin and demonstrated patency of the RIGHT common femoral and greater saphenous veins. The RIGHT groin was prepped and draped in the usual sterile fashion, and a sterile drape was applied covering the operative field. Maximum barrier sterile technique with sterile gowns and gloves were used for the procedure. A timeout was performed prior to the initiation of the procedure. Local anesthesia was provided with 1% lidocaine. Under direct ultrasound guidance, the RIGHT greater saphenous vein was accessed with a micro puncture kit ultimately allowing placement of a 7  Fr vascular sheath. Ultrasound and fluoroscopic spot images were saved for procedural documentation purposes. With the use of a 0.035 inch Bentson wire and an angled pigtail pulmonary catheter, access past the RIGHT heart and into the main pulmonary artery was obtained. Pre intervention central pulmonary arteriogram was performed. The Bentson wire was exchanged for an Amplatz wire then a 16 Fr, 33 cm GORE dry seal sheath then a 16 Fr Penumbra Lightning Flash aspiration catheter was advanced into the RIGHT then LEFT pulmonary arteries and catheter directed thrombectomy was performed. Intermittent and postprocedural pulmonary arteriograms were performed during thrombus removal. Following adequate angiographic result and the catheter and sheath were then removed, and hemostasis was obtained by manual pressure. The patient tolerated the procedure well without immediate postprocedural complication. FINDINGS: *Central pulmonary arteriogram demonstrates saddle embolus and additional burden predominantly at the pulmonary arterial bifurcations, bilateral inferior lobar and the RIGHT superior lobar pulmonary artery. *Post interventional pulmonary arteriograms demonstrating adequate debulking thrombectomy from the bilateral inferior lobar pulmonary arteries. *Mild residual thrombus burden. IMPRESSION: Successful catheter-directed bilateral pulmonary arterial debulking mechanical thrombectomy. PLAN: 1. Continue systemic anticoagulation with heparin gtt and transition to DOAC when clinically feasible. 2. The patient will be seen by me in the Vascular Interventional Radiology (VIR) clinic for postoperative follow-up, and including repeat BILATERAL lower extremity venous duplex in 1 month. Roanna Banning, MD [facility] Radiology Electronically Signed   By: Roanna Banning M.D.   On: 05/31/2023 16:29   IR Angiogram Selective Each Additional Vessel  Result Date: 05/31/2023 INDICATION:  Submassive PE. Briefly, 77 year old female presenting with severe shortness of breath. ER CTA PE demonstrating saddle embolus, submassive PE. RV/LV ratio 1.4. EXAM: Procedures: 1. PULMONARY ARTERIOGRAPHY 2. CATHETER-DIRECTED MECHANICAL THROMBECTOMY COMPARISON:  Chest XR and CTA PE, 12/30/2022. MEDICATIONS: 5K IU heparin IV. ANESTHESIA/SEDATION: Moderate (conscious) sedation was employed during this procedure. A total of Versed 2 mg and Fentanyl 25 mcg was administered intravenously. Moderate Sedation Time: 75 minutes. The patient's level of consciousness  and vital signs were monitored continuously by radiology nursing throughout the procedure under my direct supervision. CONTRAST:  50mL OMNIPAQUE IOHEXOL 300 MG/ML  SOLN FLUOROSCOPY TIME:  Fluoroscopic dose; 94 mGy COMPLICATIONS: None immediate. TECHNIQUE: Informed written consent was obtained from the patient and/or patient's representative after a discussion of the risks, benefits and alternatives to treatment. Questions regarding the procedure were encouraged and answered. A timeout was performed prior to the initiation of the procedure. Ultrasound scanning was performed of the RIGHT groin and demonstrated patency of the RIGHT common femoral and greater saphenous veins. The RIGHT groin was prepped and draped in the usual sterile fashion, and a sterile drape was applied covering the operative field. Maximum barrier sterile technique with sterile gowns and gloves were used for the procedure. A timeout was performed prior to the initiation of the procedure. Local anesthesia was provided with 1% lidocaine. Under direct ultrasound guidance, the RIGHT greater saphenous vein was accessed with a micro puncture kit ultimately allowing placement of a 7 Fr vascular sheath. Ultrasound and fluoroscopic spot images were saved for procedural documentation purposes. With the use of a 0.035 inch Bentson wire and an angled pigtail pulmonary catheter, access past the RIGHT heart and  into the main pulmonary artery was obtained. Pre intervention central pulmonary arteriogram was performed. The Bentson wire was exchanged for an Amplatz wire then a 16 Fr, 33 cm GORE dry seal sheath then a 16 Fr Penumbra Lightning Flash aspiration catheter was advanced into the RIGHT then LEFT pulmonary arteries and catheter directed thrombectomy was performed. Intermittent and postprocedural pulmonary arteriograms were performed during thrombus removal. Following adequate angiographic result and the catheter and sheath were then removed, and hemostasis was obtained by manual pressure. The patient tolerated the procedure well without immediate postprocedural complication. FINDINGS: *Central pulmonary arteriogram demonstrates saddle embolus and additional burden predominantly at the pulmonary arterial bifurcations, bilateral inferior lobar and the RIGHT superior lobar pulmonary artery. *Post interventional pulmonary arteriograms demonstrating adequate debulking thrombectomy from the bilateral inferior lobar pulmonary arteries. *Mild residual thrombus burden. IMPRESSION: Successful catheter-directed bilateral pulmonary arterial debulking mechanical thrombectomy. PLAN: 1. Continue systemic anticoagulation with heparin gtt and transition to DOAC when clinically feasible. 2. The patient will be seen by me in the Vascular Interventional Radiology (VIR) clinic for postoperative follow-up, and including repeat BILATERAL lower extremity venous duplex in 1 month. Roanna Banning, MD [facility] Inter[facility]iology [facility] Radiology Electronically Signed   By: Roanna Banning M.D.   On: 05/31/2023 16:29   IR Angiogram Selective Each Additional Vessel  Result Date: 05/31/2023 INDICATION: Submassive PE. Briefly, 77 year old female presenting with severe shortness of breath. ER CTA PE demonstrating saddle embolus, submassive PE. RV/LV ratio 1.4. EXAM: Procedures: 1. PULMONARY ARTERIOGRAPHY 2. CATHETER-DIRECTED  MECHANICAL THROMBECTOMY COMPARISON:  Chest XR and CTA PE, 12/30/2022. MEDICATIONS: 5K IU heparin IV. ANESTHESIA/SEDATION: Moderate (conscious) sedation was employed during this procedure. A total of Versed 2 mg and Fentanyl 25 mcg was administered intravenously. Moderate Sedation Time: 75 minutes. The patient's level of consciousness and vital signs were monitored continuously by radiology nursing throughout the procedure under my direct supervision. CONTRAST:  50mL OMNIPAQUE IOHEXOL 300 MG/ML  SOLN FLUOROSCOPY TIME:  Fluoroscopic dose; 94 mGy COMPLICATIONS: None immediate. TECHNIQUE: Informed written consent was obtained from the patient and/or patient's representative after a discussion of the risks, benefits and alternatives to treatment. Questions regarding the procedure were encouraged and answered. A timeout was performed prior to the initiation of the procedure. Ultrasound scanning was performed of the  RIGHT groin and demonstrated patency of the RIGHT common femoral and greater saphenous veins. The RIGHT groin was prepped and draped in the usual sterile fashion, and a sterile drape was applied covering the operative field. Maximum barrier sterile technique with sterile gowns and gloves were used for the procedure. A timeout was performed prior to the initiation of the procedure. Local anesthesia was provided with 1% lidocaine. Under direct ultrasound guidance, the RIGHT greater saphenous vein was accessed with a micro puncture kit ultimately allowing placement of a 7 Fr vascular sheath. Ultrasound and fluoroscopic spot images were saved for procedural documentation purposes. With the use of a 0.035 inch Bentson wire and an angled pigtail pulmonary catheter, access past the RIGHT heart and into the main pulmonary artery was obtained. Pre intervention central pulmonary arteriogram was performed. The Bentson wire was exchanged for an Amplatz wire then a 16 Fr, 33 cm GORE dry seal sheath then a 16 Fr Penumbra  Lightning Flash aspiration catheter was advanced into the RIGHT then LEFT pulmonary arteries and catheter directed thrombectomy was performed. Intermittent and postprocedural pulmonary arteriograms were performed during thrombus removal. Following adequate angiographic result and the catheter and sheath were then removed, and hemostasis was obtained by manual pressure. The patient tolerated the procedure well without immediate postprocedural complication. FINDINGS: *Central pulmonary arteriogram demonstrates saddle embolus and additional burden predominantly at the pulmonary arterial bifurcations, bilateral inferior lobar and the RIGHT superior lobar pulmonary artery. *Post interventional pulmonary arteriograms demonstrating adequate debulking thrombectomy from the bilateral inferior lobar pulmonary arteries. *Mild residual thrombus burden. IMPRESSION: Successful catheter-directed bilateral pulmonary arterial debulking mechanical thrombectomy. PLAN: 1. Continue systemic anticoagulation with heparin gtt and transition to DOAC when clinically feasible. 2. The patient will be seen by me in the Vascular Interventional Radiology (VIR) clinic for postoperative follow-up, and including repeat BILATERAL lower extremity venous duplex in 1 month. Roanna Banning, MD [facility] Radiology Electronically Signed   By: Roanna Banning M.D.   On: 05/31/2023 16:29   CT Angio Chest PE W and/or Wo Contrast  Result Date: 05/31/2023 CLINICAL DATA:  Shortness of breath. EXAM: CT ANGIOGRAPHY CHEST WITH CONTRAST TECHNIQUE: Multidetector CT imaging of the chest was performed using the standard protocol during bolus administration of intravenous contrast. Multiplanar CT image reconstructions and MIPs were obtained to evaluate the vascular anatomy. RADIATION DOSE REDUCTION: This exam was performed according to the departmental dose-optimization program which includes automated exposure control,  adjustment of the mA and/or kV according to patient size and/or use of iterative reconstruction technique. CONTRAST:  75mL OMNIPAQUE IOHEXOL 350 MG/ML SOLN COMPARISON:  Radiograph of same day. FINDINGS: Cardiovascular: Large saddle embolus is noted which extends into lower lobe branches of both pulmonary arteries. RV/LV ratio of 1.4 is noted suggesting right heart strain. No pericardial effusion. Coronary artery calcifications are noted. Mediastinum/Nodes: Small sliding-type hiatal hernia. No adenopathy is noted. Thyroid gland is unremarkable. Lungs/Pleura: Lungs are clear. No pleural effusion or pneumothorax. Upper Abdomen: No acute abnormality. Musculoskeletal: No chest wall abnormality. No acute or significant osseous findings. Review of the MIP images confirms the above findings. IMPRESSION: Large saddle type pulmonary embolus is noted which extends into lower lobe branches of both pulmonary arteries. Positive for acute PE with CT evidence of right heart strain (RV/LV Ratio = 1.4) consistent with at least submassive (intermediate risk) PE. The presence of right heart strain has been associated with an increased risk of morbidity and mortality. Please refer to the "Code PE Focused" order set  in EPIC. Critical Value/emergent results were called by telephone at the time of interpretation on 05/31/2023 at 10:39 am to provider [facility] , who verbally acknowledged these results. Small sliding-type hiatal hernia. Coronary artery calcifications are noted. Electronically Signed   By: Lupita Raider M.D.   On: 05/31/2023 10:39     PERTINENT LAB RESULTS: CBC: Recent Labs    06/01/23 0607 06/02/23 0546  WBC 7.8 7.1  HGB 11.2* 10.6*  HCT 33.0* 32.2*  PLT 148* 155   CMET CMP     Component Value Date/Time   NA 140 06/01/2023 0607   K 3.6 06/01/2023 0607   CL 105 06/01/2023 0607   CO2 22 06/01/2023 0607   GLUCOSE 125 (H) 06/01/2023 0607   BUN 11 06/01/2023 0607   CREATININE 1.00 06/01/2023 0607    CREATININE 0.92 12/15/2014 1050   CALCIUM 8.4 (L) 06/01/2023 0607   PROT 6.7 12/15/2014 1050   ALBUMIN 4.0 12/15/2014 1050   AST 13 12/15/2014 1050   ALT 11 12/15/2014 1050   ALKPHOS 52 12/15/2014 1050   BILITOT 0.6 12/15/2014 1050   GFRNONAA 59 (L) 06/01/2023 0607    GFR Estimated Creatinine Clearance: 45.7 mL/min (by C-G formula based on SCr of 1 mg/dL). No results for input(s): "LIPASE", "AMYLASE" in the last 72 hours. No results for input(s): "CKTOTAL", "CKMB", "CKMBINDEX", "TROPONINI" in the last 72 hours. Invalid input(s): "POCBNP" No results for input(s): "DDIMER" in the last 72 hours. No results for input(s): "HGBA1C" in the last 72 hours. No results for input(s): "CHOL", "HDL", "LDLCALC", "TRIG", "CHOLHDL", "LDLDIRECT" in the last 72 hours. No results for input(s): "TSH", "T4TOTAL", "T3FREE", "THYROIDAB" in the last 72 hours.  Invalid input(s): "FREET3" No results for input(s): "VITAMINB12", "FOLATE", "FERRITIN", "TIBC", "IRON", "RETICCTPCT" in the last 72 hours. Coags: Recent Labs    05/31/23 0816  INR 1.1   Microbiology: Recent Results (from the past 240 hour(s))  SARS Coronavirus 2 by RT PCR (hospital order, performed in [facility] hospital lab) *cepheid single result test* Anterior Nasal Swab     Status: None   Collection Time: 05/31/23  8:27 AM   Specimen: Anterior Nasal Swab  Result Value Ref Range Status   SARS Coronavirus 2 by RT PCR NEGATIVE NEGATIVE Final    Comment: Performed at [facility], [address] [place], [place] [address]    FURTHER DISCHARGE INSTRUCTIONS:  Get Medicines reviewed and adjusted: Please take all your medications with you for your next visit with your Primary MD  Laboratory/radiological data: Please request your Primary MD to go over all hospital tests and procedure/radiological results at the follow up, please ask your Primary MD to get all Hospital records sent to his/her office.  In some cases, they will be blood  work, cultures and biopsy results pending at the time of your discharge. Please request that your primary care M.D. goes through all the records of your hospital data and follows up on these results.  Also Note the following: If you experience worsening of your admission symptoms, develop shortness of breath, life threatening emergency, suicidal or homicidal thoughts you must seek medical attention immediately by calling 911 or calling your MD immediately  if symptoms less severe.  You must read complete instructions/literature along with all the possible adverse reactions/side effects for all the Medicines you take and that have been prescribed to you. Take any new Medicines after you have completely understood and accpet all the possible adverse reactions/side effects.  Do not drive when taking Pain medications or sleeping medications (Benzodaizepines)  Do not take more than prescribed Pain, Sleep and Anxiety Medications. It is not advisable to combine anxiety,sleep and pain medications without talking with your primary care practitioner  Special Instructions: If you have smoked or chewed Tobacco  in the last 2 yrs please stop smoking, stop any regular Alcohol  and or any Recreational drug use.  Wear Seat belts while driving.  Please note: You were cared for by a hospitalist during your hospital stay. Once you are discharged, your primary care physician will handle any further medical issues. Please note that NO REFILLS for any discharge medications will be authorized once you are discharged, as it is imperative that you return to your primary care physician (or establish a relationship with a primary care physician if you do not have one) for your post hospital discharge needs so that they can reassess your need for medications and monitor your lab values.  Total Time spent coordinating discharge including counseling, education and face to face time equals greater than 30  minutes.  Signed: Rossetta Knight 06/02/2023 10:10 AM

## 2023-06-03 ENCOUNTER — Other Ambulatory Visit: Payer: Self-pay | Admitting: *Deleted

## 2023-06-22 ENCOUNTER — Other Ambulatory Visit: Payer: Self-pay

## 2023-06-22 ENCOUNTER — Other Ambulatory Visit (HOSPITAL_COMMUNITY): Payer: Self-pay

## 2023-06-25 ENCOUNTER — Inpatient Hospital Stay: Payer: Medicare Other

## 2023-06-25 ENCOUNTER — Other Ambulatory Visit: Payer: Self-pay

## 2023-06-25 ENCOUNTER — Inpatient Hospital Stay: Payer: Medicare Other | Attending: Hematology and Oncology | Admitting: Hematology and Oncology

## 2023-06-25 VITALS — BP 137/87 | HR 71 | Temp 98.1°F | Resp 16 | Ht 62.0 in | Wt 169.0 lb

## 2023-06-25 DIAGNOSIS — Z86718 Personal history of other venous thrombosis and embolism: Secondary | ICD-10-CM | POA: Diagnosis not present

## 2023-06-25 DIAGNOSIS — I82432 Acute embolism and thrombosis of left popliteal vein: Secondary | ICD-10-CM | POA: Diagnosis not present

## 2023-06-25 DIAGNOSIS — Z7901 Long term (current) use of anticoagulants: Secondary | ICD-10-CM | POA: Insufficient documentation

## 2023-06-25 DIAGNOSIS — E663 Overweight: Secondary | ICD-10-CM | POA: Insufficient documentation

## 2023-06-25 DIAGNOSIS — I82452 Acute embolism and thrombosis of left peroneal vein: Secondary | ICD-10-CM | POA: Diagnosis not present

## 2023-06-25 DIAGNOSIS — I2602 Saddle embolus of pulmonary artery with acute cor pulmonale: Secondary | ICD-10-CM

## 2023-06-25 DIAGNOSIS — Z8673 Personal history of transient ischemic attack (TIA), and cerebral infarction without residual deficits: Secondary | ICD-10-CM | POA: Diagnosis not present

## 2023-06-25 LAB — ANTITHROMBIN III: AntiThromb III Func: 103 % (ref 75–120)

## 2023-06-25 NOTE — Assessment & Plan Note (Signed)
05/31/23: CTA chest: Large saddle embolus 06/01/2023: Lower extremity Doppler: Left popliteal vein/left peroneal vein DVT  Current treatment: Eliquis   I discussed with the patient risk factors for blood clots.  Inherited risk factors include: 1. Factor V Leiden mutation 2. Prothrombin gene G20210A 3. Protein S deficiency  4. Protein C deficiency  5. Antithrombin deficiency  Acquired risk factors include: 1. Antiphospholipid antibody syndrome 2. Tobacco use 3. Obesity 4. Medications  5. Sedentary behavior including postoperative state 6. Foreign bodies in circulation  Workup recommended: Bloodwork to evaluate for the 5 inherited factors mentioned above along with antiphospholipid antibodies. Return to clinic in one to 2 weeks to discuss the results of the tests   The above testing will determine the duration of anticoagulation.

## 2023-06-25 NOTE — Progress Notes (Signed)
[facility] CONSULT NOTE  Patient Care Team: Tisovec, Adelfa Koh, MD as PCP - General (Internal Medicine)  CHIEF COMPLAINTS/PURPOSE OF CONSULTATION:  DVT and pulmonary embolism  HISTORY OF PRESENTING ILLNESS:  Leah Knight 77 y.o. female is here because of recent diagnosis of DVT and bilateral pulmonary emboli.  Patient was in her usual state of health when she started noticing difficulty breathing and chest discomfort and went to the emergency room and was diagnosed with bilateral pulmonary emboli.  She was also diagnosed with DVT.  She was started on Eliquis and she was referred to [place] for evaluation of causes of her blood clots as well as to figure out the duration of anticoagulation.  Patient reports that she is tolerating Eliquis fairly well without any major problems or concerns.  I reviewed her records extensively and collaborated the history with the patient.     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   Hx of retinal hemorrhage    Hyperlipemia    Hypertension    Sleep apnea    Stroke [facility])     SURGICAL HISTORY: Past Surgical History:  Procedure Laterality Date   CATARACT EXTRACTION, BILATERAL  summer 2021   childbirth     x 2   IR ANGIOGRAM PULMONARY BILATERAL SELECTIVE  05/31/2023   IR ANGIOGRAM SELECTIVE EACH ADDITIONAL VESSEL  05/31/2023   IR ANGIOGRAM SELECTIVE EACH ADDITIONAL VESSEL  05/31/2023   IR THROMBECT PRIM MECH INIT (INCLU) MOD SED  05/31/2023   IR THROMBECT PRIM MECH INIT (INCLU) MOD SED  05/31/2023   IR US GUIDE VASC ACCESS RIGHT  05/31/2023    SOCIAL HISTORY: Social History   Socioeconomic History   Marital status: Widowed    Spouse name: Not on file   Number of children: 2   Years of education: Not on file   Highest education level: Not on file  Occupational History   Not on file  Tobacco Use   Smoking  status: Never   Smokeless tobacco: Never  Substance and Sexual Activity   Alcohol use: Not Currently   Drug use: Not Currently   Sexual activity: Not on file  Other Topics Concern   Not on file  Social History Narrative   Lives alone    Right handed   Caffeine: 1-2 cups/day   Social Determinants of Health   Financial Resource Strain: Not on file  Food Insecurity: No Food Insecurity (05/31/2023)   Hunger Vital Sign    Worried About Running Out of Food in the Last Year: Never true    Ran Out of Food in the Last Year: Never true  Transportation Needs: No Transportation Needs (05/31/2023)   PRAPARE - Administrator, Civil Service (Medical): No    Lack of Transportation (Non-Medical): No  Physical Activity: Not on file  Stress: Not on file  Social Connections: Not on file  Intimate Partner Violence: Not At Risk (05/31/2023)   Humiliation, Afraid, Rape, and Kick questionnaire    Fear of Current or Ex-Partner: No    Emotionally Abused: No    Physically Abused: No    Sexually Abused: No    FAMILY HISTORY: Family History  Problem Relation Age of Onset   Heart failure Mother    Hypertension Mother    Pneumonia Father    Diabetes Father  Hypertension Brother    Diabetes Brother    Colon cancer Neg Hx    Esophageal cancer Neg Hx    Rectal cancer Neg Hx    Stomach cancer Neg Hx     ALLERGIES:  is allergic to sulfa antibiotics.  MEDICATIONS:  Current Outpatient Medications  Medication Sig Dispense Refill   apixaban (ELIQUIS) 5 MG TABS tablet Take 10 mg (2 tablets) by mouth twice daily for 7 days-on 06/08/2023-switch to 5 mg (1 tablet) by mouth daily 90 tablet 4   atorvastatin (LIPITOR) 10 MG tablet Take 10 mg by mouth daily.     losartan (COZAAR) 50 MG tablet Take 50 mg by mouth daily.     omeprazole (PRILOSEC) 40 MG capsule Take 1 capsule (40 mg total) by mouth in the morning and at bedtime. (Patient taking differently: Take 40 mg by mouth daily.) 180 capsule 3    propranolol (INDERAL) 40 MG tablet Take 40 mg by mouth daily.     TURMERIC CURCUMIN PO Take 1 tablet by mouth daily.     VITAMIN E PO Take 1 capsule by mouth daily.     Current Facility-Administered Medications  Medication Dose Route Frequency Provider Last Rate Last Admin   0.9 %  sodium chloride infusion  500 mL Intravenous Once Tressia Danas, MD        REVIEW OF SYSTEMS:   Constitutional: Denies fevers, chills or abnormal night sweats   All other systems were reviewed with the patient and are negative.  PHYSICAL EXAMINATION: ECOG PERFORMANCE STATUS: 1 - Symptomatic but completely ambulatory  Vitals:   06/25/23 0902  BP: 137/87  Pulse: 71  Resp: 16  Temp: 98.1 F (36.7 C)  SpO2: 97%   Filed Weights   06/25/23 0902  Weight: 169 lb (76.7 kg)    GENERAL:alert, no distress and comfortable    LABORATORY DATA:  I have reviewed the data as listed Lab Results  Component Value Date   WBC 7.1 06/02/2023   HGB 10.6 (L) 06/02/2023   HCT 32.2 (L) 06/02/2023   MCV 92.5 06/02/2023   PLT 155 06/02/2023   Lab Results  Component Value Date   NA 140 06/01/2023   K 3.6 06/01/2023   CL 105 06/01/2023   CO2 22 06/01/2023    RADIOGRAPHIC STUDIES: I have personally reviewed the radiological reports and agreed with the findings in the report.  ASSESSMENT AND PLAN:  Acute saddle pulmonary embolism with acute cor pulmonale (HCC) 05/31/23: CTA chest: Large saddle embolus 06/01/2023: Lower extremity Doppler: Left popliteal vein/left peroneal vein DVT  Current treatment: Eliquis   I discussed with the patient risk factors for blood clots.  Inherited risk factors include: 1. Factor V Leiden mutation 2. Prothrombin gene G20210A 3. Protein S deficiency  4. Protein C deficiency  5. Antithrombin deficiency  Acquired risk factors include: 1. Antiphospholipid antibody syndrome 2. Tobacco use: Patient does not smoke 3. Obesity: Slightly overweight 4. Medications: Take does  not take any medications 5. Foreign bodies in circulation: None  Workup recommended: Bloodwork to evaluate for the 5 inherited factors mentioned above along with antiphospholipid antibodies. Return to clinic in one to 2 weeks to discuss the results of the tests   The above testing will determine the duration of anticoagulation.  All questions were answered. The patient knows to call the clinic with any problems, questions or concerns.    Tamsen Meek, MD 06/25/23

## 2023-06-27 ENCOUNTER — Other Ambulatory Visit (HOSPITAL_COMMUNITY): Payer: Self-pay

## 2023-06-27 LAB — PROTEIN S, TOTAL: Protein S Ag, Total: 95 % (ref 60–150)

## 2023-06-27 LAB — LUPUS ANTICOAGULANT PANEL
DRVVT: 75.7 s — ABNORMAL HIGH (ref 0.0–47.0)
PTT Lupus Anticoagulant: 37.4 s (ref 0.0–43.5)

## 2023-06-27 LAB — DRVVT CONFIRM: dRVVT Confirm: 1 ratio (ref 0.8–1.2)

## 2023-06-27 LAB — FACTOR 8 ASSAY: Coagulation Factor VIII: 207 % — ABNORMAL HIGH (ref 56–140)

## 2023-06-27 LAB — DRVVT MIX: dRVVT Mix: 52.9 s — ABNORMAL HIGH (ref 0.0–40.4)

## 2023-06-27 NOTE — Progress Notes (Unsigned)
PATIENT: Leah Knight DOB: 04-Sep-1946  REASON FOR VISIT: follow up HISTORY FROM: patient  No chief complaint on file.    HISTORY OF PRESENT ILLNESS:  06/27/23 ALL:  Leah Knight returns for follow up for OSA on CPAP. She is doing well. She is using therapy nightly for about 6-7 hours, on average.     06/24/2022 ALL: Leah Knight is a 77 y.o. female here today for follow up for OSA on CPAP. She reports doing well on therapy. No concerns with machine or supplies. She is using her CPAP most nights. She did go out of town and did not take it with her. She admits that she falls asleep before starting therapy sometimes. She does note significant improvement in sleep quality and wakes feeling more refreshed when using CPAP. She has gained some weight and feels this may be contributing to fatigue.     HISTORY: (copied from Dr Dohmeier's previous note)  Interval history from 06-18-2021 : I have the pleasure of meeting [name] today the patient has been a CPAP user.avs Had not been able to use his CPAP every day of the last 30 days.  Her overall compliance is 23 out of 30 days with 77% the average daily use at time is all days also those of normal use is 3 hours and 54 minutes.  She is using an AutoSet between a minimum pressure of 5 and a maximum pressure of 15 cmH2O with 3 cm EPR she is requiring at the 95th percentile 13.5 cm water.  Her residual AHI is 1.6 which speaks for an excellent resolution and she has only mild air leakage from the mask.  So overall this seems to work very well for her I think we could increase the maximum pressure to 17 just to make sure that she has able to respond to all obstructive events appropriately.  The patient also has a history of gastroesophageal reflux reflux and acid reflux can mimic apnea she is currently treated on pantoprazole 40 mg daily, she is continuing to take propranolol Inderal 40 mg daily Prilosec was her previous medication she  has been on Cozaar for blood pressure control on vitamin D3 calcium atorvastatin and sometimes takes Tylenol for arthritis.  She has not gone to the weight los program she had spoken about last visit- seems not motivated and depressed.    Today's weight was 169 pounds at a height of 5 foot 2 inches of course this includes her clothes and shoes.   REVIEW OF SYSTEMS: Out of a complete 14 system review of symptoms, the patient complains only of the following symptoms, fatigue, anxiety, and all other reviewed systems are negative.  ESS: 12/24, previously 4/24  ALLERGIES: Allergies  Allergen Reactions   Sulfa Antibiotics Itching    HOME MEDICATIONS: Outpatient Medications Prior to Visit  Medication Sig Dispense Refill   apixaban (ELIQUIS) 5 MG TABS tablet Take 10 mg (2 tablets) by mouth twice daily for 7 days-on 06/08/2023-switch to 5 mg (1 tablet) by mouth daily 90 tablet 4   atorvastatin (LIPITOR) 10 MG tablet Take 10 mg by mouth daily.     losartan (COZAAR) 50 MG tablet Take 50 mg by mouth daily.     omeprazole (PRILOSEC) 40 MG capsule Take 1 capsule (40 mg total) by mouth in the morning and at bedtime. (Patient taking differently: Take 40 mg by mouth daily.) 180 capsule 3   propranolol (INDERAL) 40 MG tablet Take 40 mg by mouth  daily.     TURMERIC CURCUMIN PO Take 1 tablet by mouth daily.     VITAMIN E PO Take 1 capsule by mouth daily.     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   Hx of retinal hemorrhage    Hyperlipemia    Hypertension    Sleep apnea    Stroke [facility])     PAST SURGICAL HISTORY: Past Surgical History:  Procedure Laterality Date   CATARACT  EXTRACTION, BILATERAL  summer 2021   childbirth     x 2   IR ANGIOGRAM PULMONARY BILATERAL SELECTIVE  05/31/2023   IR ANGIOGRAM SELECTIVE EACH ADDITIONAL VESSEL  05/31/2023   IR ANGIOGRAM SELECTIVE EACH ADDITIONAL VESSEL  05/31/2023   IR THROMBECT PRIM MECH INIT (INCLU) MOD SED  05/31/2023   IR THROMBECT PRIM MECH INIT (INCLU) MOD SED  05/31/2023   IR US GUIDE VASC ACCESS RIGHT  05/31/2023    FAMILY HISTORY: Family History  Problem Relation Age of Onset   Heart failure Mother    Hypertension Mother    Pneumonia Father    Diabetes Father    Hypertension Brother    Diabetes Brother    Colon cancer Neg Hx    Esophageal cancer Neg Hx    Rectal cancer Neg Hx    Stomach cancer Neg Hx     SOCIAL HISTORY: Social History   Socioeconomic History   Marital status: Widowed    Spouse name: Not on file   Number of children: 2   Years of education: Not on file   Highest education level: Not on file  Occupational History   Not on file  Tobacco Use   Smoking status: Never   Smokeless tobacco: Never  Substance and Sexual Activity   Alcohol use: Not Currently   Drug use: Not Currently   Sexual activity: Not on file  Other Topics Concern   Not on file  Social History Narrative   Lives alone    Right handed   Caffeine: 1-2 cups/day   Social Determinants of Health   Financial Resource Strain: Not on file  Food Insecurity: No Food Insecurity (05/31/2023)   Hunger Vital Sign    Worried About Running Out of Food in the Last Year: Never true    Ran Out of Food in the Last Year: Never true  Transportation Needs: No Transportation Needs (05/31/2023)   PRAPARE - Administrator, Civil Service (Medical): No    Lack of Transportation (Non-Medical): No  Physical Activity: Not on file  Stress: Not on file  Social Connections: Not on file  Intimate Partner Violence: Not At Risk (05/31/2023)   Humiliation, Afraid, Rape, and Kick questionnaire    Fear of Current or Ex-Partner: No     Emotionally Abused: No    Physically Abused: No    Sexually Abused: No     PHYSICAL EXAM  There were no vitals filed for this visit.  There is no height or weight on file to calculate BMI.  Generalized: Well developed, in no acute distress  Cardiology: normal rate and rhythm, no murmur noted Respiratory: clear  to auscultation bilaterally  Neurological examination  Mentation: Alert oriented to time, place, history taking. Follows all commands speech and language fluent Cranial nerve II-XII: Pupils were equal round reactive to light. Extraocular movements were full, visual field were full  Motor: The motor testing reveals 5 over 5 strength of all 4 extremities. Good symmetric motor tone is noted throughout.  Gait and station: Gait is normal.    DIAGNOSTIC DATA (LABS, IMAGING, TESTING) - I reviewed patient records, labs, notes, testing and imaging myself where available.      No data to display           Lab Results  Component Value Date   WBC 7.1 06/02/2023   HGB 10.6 (L) 06/02/2023   HCT 32.2 (L) 06/02/2023   MCV 92.5 06/02/2023   PLT 155 06/02/2023      Component Value Date/Time   NA 140 06/01/2023 0607   K 3.6 06/01/2023 0607   CL 105 06/01/2023 0607   CO2 22 06/01/2023 0607   GLUCOSE 125 (H) 06/01/2023 0607   BUN 11 06/01/2023 0607   CREATININE 1.00 06/01/2023 0607   CREATININE 0.92 12/15/2014 1050   CALCIUM 8.4 (L) 06/01/2023 0607   PROT 6.7 12/15/2014 1050   ALBUMIN 4.0 12/15/2014 1050   AST 13 12/15/2014 1050   ALT 11 12/15/2014 1050   ALKPHOS 52 12/15/2014 1050   BILITOT 0.6 12/15/2014 1050   GFRNONAA 59 (L) 06/01/2023 0607   No results found for: "CHOL", "HDL", "LDLCALC", "LDLDIRECT", "TRIG", "CHOLHDL" No results found for: "HGBA1C" No results found for: "VITAMINB12" Lab Results  Component Value Date   TSH 2.209 12/15/2014     ASSESSMENT AND PLAN 77 y.o. year old female  has a past medical history of Allergy, Arthritis, GERD  (gastroesophageal reflux disease), H/O: stroke (2000), retinal hemorrhage, Hyperlipemia, Hypertension, Sleep apnea, and Stroke (HCC). here with   No diagnosis found.    Meghana Tullo Pavlock is doing well on CPAP therapy. Compliance report reveals acceptable compliance. She was encouraged to continue using CPAP nightly and for greater than 4 hours each night. We have discussed ESS score of 12/24, previously 4/24. She contributes some to weight gain. She was encouraged to work on improving compliance and let me know if daytime sleepiness does not improve. I would prefer to avoid stimulants if possible but may consider as needed if EDS effects daytime functioning. We will update supply orders as indicated. Risks of untreated sleep apnea review and education materials provided. Healthy lifestyle habits encouraged. She will follow up in 1 year, sooner if needed. She verbalizes understanding and agreement with this plan.    No orders of the defined types were placed in this encounter.    No orders of the defined types were placed in this encounter.     Christena Deem 06/27/2023, 4:37 PM [facility] [address] [place], [place] [address] [phone]

## 2023-06-27 NOTE — Patient Instructions (Incomplete)

## 2023-06-28 ENCOUNTER — Ambulatory Visit: Payer: Medicare Other | Admitting: Family Medicine

## 2023-06-28 ENCOUNTER — Encounter: Payer: Self-pay | Admitting: Family Medicine

## 2023-06-28 VITALS — BP 137/77 | HR 60 | Ht 62.0 in | Wt 169.2 lb

## 2023-06-28 DIAGNOSIS — G4733 Obstructive sleep apnea (adult) (pediatric): Secondary | ICD-10-CM

## 2023-06-29 LAB — PROTEIN C, TOTAL: Protein C, Total: 122 % (ref 60–150)

## 2023-07-03 NOTE — Progress Notes (Signed)
HEMATOLOGY-ONCOLOGY TELEPHONE VISIT PROGRESS NOTE  I connected with our patient on 07/07/23 at  8:15 AM EDT by telephone and verified that I am speaking with the correct person using two identifiers.  I discussed the limitations, risks, security and privacy concerns of performing an evaluation and management service by telephone and the availability of in person appointments.  I also discussed with the patient that there may be a patient responsible charge related to this service. The patient expressed understanding and agreed to proceed.   History of Present Illness: Leah Knight 77 y.o. female is here because of recent diagnosis of DVT and bilateral pulmonary emboli.  She presents to the clinic for a telephone follow-up to discuss test results.    REVIEW OF SYSTEMS:   Constitutional: Denies fevers, chills or abnormal weight loss All other systems were reviewed with the patient and are negative. Observations/Objective:     Assessment Plan:  Acute saddle pulmonary embolism with acute cor pulmonale (HCC) 05/31/23: CTA chest: Large saddle embolus 06/01/2023: Lower extremity Doppler: Left popliteal vein/left peroneal vein DVT   Current treatment: Eliquis Hypercoagulability testing 06/25/2023: Antithrombin III: 103%, protein S: 95%, protein C: 122%, no lupus anticoagulant was detected.  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  This alone is not an indication for indefinite anticoagulation.  Recommend anticoagulation for 6 months and then repeat Doppler ultrasound and a D-dimer before discontinuing therapy.    I discussed the assessment and treatment plan with the patient. The patient was provided an opportunity to ask questions and all were answered. The patient agreed with the plan and demonstrated an understanding of the instructions. The patient was advised  to call back or seek an in-person evaluation if the symptoms worsen or if the condition fails to improve as anticipated.   I provided 12 minutes of non-face-to-face time during this encounter.  This includes time for charting and coordination of care   Tamsen Meek, MD  I Janan Ridge am acting as a scribe for Dr.Vinay Gudena  I have reviewed the above documentation for accuracy and completeness, and I agree with the above.

## 2023-07-07 ENCOUNTER — Inpatient Hospital Stay: Payer: Medicare Other | Attending: Hematology and Oncology | Admitting: Hematology and Oncology

## 2023-07-07 DIAGNOSIS — I2602 Saddle embolus of pulmonary artery with acute cor pulmonale: Secondary | ICD-10-CM

## 2023-07-07 NOTE — Assessment & Plan Note (Signed)
05/31/23: CTA chest: Large saddle embolus 06/01/2023: Lower extremity Doppler: Left popliteal vein/left peroneal vein DVT   Current treatment: Eliquis Hypercoagulability testing 06/25/2023: Antithrombin III: 103%, protein S: 95%, protein C: 122%, no lupus anticoagulant was detected.  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  This alone is not an indication for indefinite anticoagulation.  Recommend anticoagulation for 6 months and then repeat Doppler ultrasound and a D-dimer before discontinuing therapy.

## 2023-10-04 ENCOUNTER — Other Ambulatory Visit: Payer: Self-pay

## 2023-11-08 ENCOUNTER — Ambulatory Visit (HOSPITAL_COMMUNITY)
Admission: RE | Admit: 2023-11-08 | Discharge: 2023-11-08 | Disposition: A | Discharge status: Home / Self Care | Payer: Medicare Other | Source: Ambulatory Visit | Attending: Hematology and Oncology | Admitting: Hematology and Oncology

## 2023-11-08 DIAGNOSIS — I2602 Saddle embolus of pulmonary artery with acute cor pulmonale: Secondary | ICD-10-CM

## 2023-11-08 NOTE — Progress Notes (Signed)
Left lower extremity venous duplex has been completed. Preliminary results can be found in CV Proc through chart review.  Results were given to [facility] at Dr. Earmon [place] office.  11/08/23 3:29 PM Olen Cordial RVT

## 2023-11-14 ENCOUNTER — Ambulatory Visit: Payer: Medicare Other | Admitting: Hematology and Oncology

## 2023-11-14 ENCOUNTER — Other Ambulatory Visit: Payer: Medicare Other

## 2023-11-24 ENCOUNTER — Other Ambulatory Visit: Payer: Self-pay

## 2023-11-24 ENCOUNTER — Inpatient Hospital Stay: Payer: Medicare Other | Admitting: Hematology and Oncology

## 2023-11-24 ENCOUNTER — Inpatient Hospital Stay: Payer: Medicare Other | Attending: Hematology and Oncology

## 2023-11-24 VITALS — BP 154/77 | HR 64 | Temp 98.0°F | Resp 18 | Ht 62.0 in | Wt 167.8 lb

## 2023-11-24 DIAGNOSIS — Z86718 Personal history of other venous thrombosis and embolism: Secondary | ICD-10-CM | POA: Diagnosis present

## 2023-11-24 DIAGNOSIS — Z7901 Long term (current) use of anticoagulants: Secondary | ICD-10-CM | POA: Diagnosis not present

## 2023-11-24 DIAGNOSIS — I2602 Saddle embolus of pulmonary artery with acute cor pulmonale: Secondary | ICD-10-CM | POA: Diagnosis not present

## 2023-11-24 DIAGNOSIS — Z86711 Personal history of pulmonary embolism: Secondary | ICD-10-CM | POA: Diagnosis present

## 2023-11-24 LAB — D-DIMER, QUANTITATIVE: D-Dimer, Quant: 0.29 ug{FEU}/mL (ref 0.00–0.50)

## 2023-11-24 NOTE — Progress Notes (Signed)
Patient Care Team: Tisovec, Adelfa Koh, MD as PCP - General (Internal Medicine)  DIAGNOSIS:  Encounter Diagnosis  Name Primary?   Acute saddle pulmonary embolism with acute cor pulmonale (HCC) Yes      CHIEF COMPLIANT: Follow-up of history of blood clots  HISTORY OF PRESENT ILLNESS:   History of Present Illness   The patient, with a history of a blood clot six months ago, presents for a follow-up visit. She reports no major bleeding issues while on blood thinners. She has had an ultrasound of the legs, which came back negative and all clear. She denies any issues with her breathing. The patient's oxygen levels were reported to be at 96%. She is awaiting the results of a D-dimer test, which will determine whether she can come off her blood thinners. The patient also has a slightly elevated factor VIII, indicating a mild increase in her blood clotting risk. She believes her previous blood clot was caused by the COVID-19 vaccine. She reports normal cramping in her legs, which is not the same as intense pain.         ALLERGIES:  is allergic to sulfa antibiotics.  MEDICATIONS:  Current Outpatient Medications  Medication Sig Dispense Refill   apixaban (ELIQUIS) 5 MG TABS tablet Take 10 mg (2 tablets) by mouth twice daily for 7 days-on 06/08/2023-switch to 5 mg (1 tablet) by mouth daily 90 tablet 4   atorvastatin (LIPITOR) 10 MG tablet Take 10 mg by mouth daily.     losartan (COZAAR) 50 MG tablet Take 50 mg by mouth daily.     omeprazole (PRILOSEC) 40 MG capsule Take 1 capsule (40 mg total) by mouth in the morning and at bedtime. (Patient taking differently: Take 40 mg by mouth daily.) 180 capsule 3   propranolol (INDERAL) 40 MG tablet Take 40 mg by mouth daily.     TURMERIC CURCUMIN PO Take 1 tablet by mouth daily.     VITAMIN E PO Take 1 capsule by mouth daily.     Current Facility-Administered Medications  Medication Dose Route Frequency Provider Last Rate Last Admin   0.9 %  sodium  chloride infusion  500 mL Intravenous Once Tressia Danas, MD        PHYSICAL EXAMINATION: ECOG PERFORMANCE STATUS: 1 - Symptomatic but completely ambulatory  Vitals:   11/24/23 1412  BP: (!) 154/77  Pulse: 64  Resp: 18  Temp: 98 F (36.7 C)  SpO2: 96%   Filed Weights   11/24/23 1412  Weight: 167 lb 12.8 oz (76.1 kg)    Physical Exam   VITALS: SaO2- 96      (exam performed in the presence of a chaperone)  LABORATORY DATA:  I have reviewed the data as listed    Latest Ref Rng & Units 06/01/2023    6:07 AM 05/31/2023    8:16 AM 12/15/2014   10:50 AM  CMP  Glucose 70 - 99 mg/dL 782  956  87   BUN 8 - 23 mg/dL 11  17  16    Creatinine 0.44 - 1.00 mg/dL 2.13  0.86  5.78   Sodium 135 - 145 mmol/L 140  139  140   Potassium 3.5 - 5.1 mmol/L 3.6  4.3  4.4   Chloride 98 - 111 mmol/L 105  108  106   CO2 22 - 32 mmol/L 22  21  24    Calcium 8.9 - 10.3 mg/dL 8.4  9.0  9.3   Total Protein 6.0 - 8.3  g/dL   6.7   Total Bilirubin 0.2 - 1.2 mg/dL   0.6   Alkaline Phos 39 - 117 U/L   52   AST 0 - 37 U/L   13   ALT 0 - 35 U/L   11     Lab Results  Component Value Date   WBC 7.1 06/02/2023   HGB 10.6 (L) 06/02/2023   HCT 32.2 (L) 06/02/2023   MCV 92.5 06/02/2023   PLT 155 06/02/2023   NEUTROABS 5.8 05/31/2023    ASSESSMENT & PLAN:  Acute saddle pulmonary embolism with acute cor pulmonale (HCC) 05/31/23: CTA chest: Large saddle embolus 06/01/2023: Lower extremity Doppler: Left popliteal vein/left peroneal vein DVT   Current treatment: Eliquis Hypercoagulability testing 06/25/2023: Antithrombin III: 103%, protein S: 95%, protein C: 122%, no lupus anticoagulant was detected.  Prothrombin gene mutation: Not detected, factor V Leiden: Not detected, factor VIII: 207%    Deep Vein Thrombosis (DVT) History of DVT 6 months ago, possibly related to COVID vaccination. Recent ultrasound of legs negative. Awaiting D-dimer results to determine if anticoagulation can be discontinued. Mildly  elevated Factor VIII, increasing risk of clotting slightly above normal. D-dimer: Negative: Advised patient to discontinue anticoagulation. 11/08/2023: Ultrasound lower extremity: No evidence of DVTs  -Advised patient to report any new leg swelling or calf pain immediately for ultrasound evaluation.  General Health Maintenance -As needed follow-up appointments  .      No orders of the defined types were placed in this encounter.  The patient has a good understanding of the overall plan. she agrees with it. she will call with any problems that may develop before the next visit here. Total time spent: 30 mins including face to face time and time spent for planning, charting and co-ordination of care   Tamsen Meek, MD 11/24/23

## 2023-11-24 NOTE — Assessment & Plan Note (Signed)
05/31/23: CTA chest: Large saddle embolus 06/01/2023: Lower extremity Doppler: Left popliteal vein/left peroneal vein DVT   Current treatment: Eliquis Hypercoagulability testing 06/25/2023: Antithrombin III: 103%, protein S: 95%, protein C: 122%, no lupus anticoagulant was detected.  Prothrombin gene mutation: Not detected, factor V Leiden: Not detected, factor VIII: 207%  11/08/2023: Ultrasound lower extremity: No evidence of DVTs Recommendation: Patient can discontinue anticoagulation at this time.

## 2024-01-09 ENCOUNTER — Telehealth: Payer: Self-pay

## 2024-01-09 DIAGNOSIS — G4733 Obstructive sleep apnea (adult) (pediatric): Secondary | ICD-10-CM

## 2024-01-09 NOTE — Telephone Encounter (Signed)
Patient called in and asked for a message to be sent back to Shawnie Dapper NP. She states that when she was here for her routine visit she was told she qualified for a new machine. At the time she did not want to pursue this, but she has changed her mind. She would like to know if this option is still available. She is still using [employer]. She thinks sometimes that her current machine is not working as well as it used to, states it is probably 14+ years old.

## 2024-01-18 ENCOUNTER — Encounter (HOSPITAL_BASED_OUTPATIENT_CLINIC_OR_DEPARTMENT_OTHER): Payer: Self-pay | Admitting: *Deleted

## 2024-01-18 ENCOUNTER — Other Ambulatory Visit: Payer: Self-pay

## 2024-01-18 ENCOUNTER — Emergency Department (HOSPITAL_BASED_OUTPATIENT_CLINIC_OR_DEPARTMENT_OTHER)
Admission: EM | Admit: 2024-01-18 | Discharge: 2024-01-19 | Disposition: A | Discharge status: Home / Self Care | Payer: Medicare Other | Attending: Emergency Medicine | Admitting: Emergency Medicine

## 2024-01-18 DIAGNOSIS — H5712 Ocular pain, left eye: Secondary | ICD-10-CM | POA: Insufficient documentation

## 2024-01-18 DIAGNOSIS — H2000 Unspecified acute and subacute iridocyclitis: Secondary | ICD-10-CM | POA: Insufficient documentation

## 2024-01-18 DIAGNOSIS — Z7901 Long term (current) use of anticoagulants: Secondary | ICD-10-CM | POA: Insufficient documentation

## 2024-01-18 DIAGNOSIS — H209 Unspecified iridocyclitis: Secondary | ICD-10-CM

## 2024-01-18 MED ORDER — FLUORESCEIN SODIUM 1 MG OP STRP
1.0000 | ORAL_STRIP | Freq: Once | OPHTHALMIC | Status: AC
  Administered 2024-01-18: 1 via OPHTHALMIC
  Filled 2024-01-18: qty 1

## 2024-01-18 MED ORDER — TETRACAINE HCL 0.5 % OP SOLN
2.0000 [drp] | Freq: Once | OPHTHALMIC | Status: AC
  Administered 2024-01-18: 2 [drp] via OPHTHALMIC
  Filled 2024-01-18: qty 4

## 2024-01-18 NOTE — ED Notes (Signed)
Pt was dx a week ago with iritis of the left eye last week, saw her eye doctor yesterday and was prescribed stronger eye drops, she used them today and drops have caused her to have a constant throbbing pain

## 2024-01-18 NOTE — ED Triage Notes (Addendum)
Pt reports that she has had an inflammation of the iris of the left eye for a week and has seen her eye doctor for this.  She was placed on eyedrops last Monday and then went back for reevaluation yesterday and was placed on new eyedrops which she states caused her to have pain in her right eye.  Pt was dx with iritis by eye MD

## 2024-01-19 MED ORDER — CYCLOPENTOLATE HCL 1 % OP SOLN
1.0000 [drp] | Freq: Once | OPHTHALMIC | Status: AC
  Administered 2024-01-19: 1 [drp] via OPHTHALMIC
  Filled 2024-01-19 (×2): qty 2

## 2024-01-19 MED ORDER — FLUORESCEIN SODIUM 1 MG OP STRP
ORAL_STRIP | OPHTHALMIC | Status: AC
  Administered 2024-01-19: 1
  Filled 2024-01-19: qty 1

## 2024-01-19 MED ORDER — OXYCODONE-ACETAMINOPHEN 5-325 MG PO TABS
1.0000 | ORAL_TABLET | Freq: Once | ORAL | Status: AC
  Administered 2024-01-19: 1 via ORAL
  Filled 2024-01-19: qty 1

## 2024-01-19 NOTE — ED Provider Notes (Signed)
[place] [facility] Provider Note   CSN: [id number] Arrival date & time: 01/18/24  2331     History  Chief Complaint  Patient presents with   Eye Pain    Leah Knight is a 78 y.o. female.  Patient presents to the emergency department for evaluation of left eye pain.  Patient reports that she is currently being treated for iritis by Dr. Cathey Endow.  He had her on a steroid drop for more than a week.  She had follow-up yesterday and he changed the steroid drop.  Patient reports that when she put the medicine in her eye tonight she had onset of throbbing pain.       Home Medications Prior to Admission medications   Medication Sig Start Date End Date Taking? Authorizing Provider  apixaban (ELIQUIS) 5 MG TABS tablet Take 10 mg (2 tablets) by mouth twice daily for 7 days-on 06/08/2023-switch to 5 mg (1 tablet) by mouth daily 06/02/23   Ghimire, Werner Lean, MD  atorvastatin (LIPITOR) 10 MG tablet Take 10 mg by mouth daily.    [provider]  losartan (COZAAR) 50 MG tablet Take 50 mg by mouth daily.    [provider]  omeprazole (PRILOSEC) 40 MG capsule Take 1 capsule (40 mg total) by mouth in the morning and at bedtime. Patient taking differently: Take 40 mg by mouth daily. 10/20/22   Tressia Danas, MD  propranolol (INDERAL) 40 MG tablet Take 40 mg by mouth daily.    [provider]  TURMERIC CURCUMIN PO Take 1 tablet by mouth daily.    [provider]  VITAMIN E PO Take 1 capsule by mouth daily.    [provider]      Allergies    Sulfa antibiotics    Review of Systems   Review of Systems  Physical Exam Updated Vital Signs BP (!) 161/79 (BP Location: Right Arm)   Pulse 63   Temp 98.5 F (36.9 C) (Oral)   Resp 16   SpO2 95%  Physical Exam Constitutional:      Appearance: Normal appearance.  HENT:     Head: Normocephalic and atraumatic.     Nose: Congestion present.  Eyes:      General: Lids are normal. Lids are everted, no foreign bodies appreciated. Vision grossly intact. Gaze aligned appropriately.        Left eye: No foreign body, discharge or hordeolum.     Intraocular pressure: Left eye pressure is 16 mmHg.     Extraocular Movements: Extraocular movements intact.     Conjunctiva/sclera:     Left eye: Left conjunctiva is not injected. No chemosis, exudate or hemorrhage.    Pupils: Pupils are equal, round, and reactive to light.     Left eye: Fluorescein uptake present.     Slit lamp exam:    Left eye: Anterior chamber flares and photophobia present. No corneal ulcer, foreign body, hyphema, hypopyon or anterior chamber bulge.   Neurological:     Mental Status: She is alert.     ED Results / Procedures / Treatments   Labs (all labs ordered are listed, but only abnormal results are displayed) Labs Reviewed - No data to display  EKG None  Radiology No results found.  Procedures Procedures    Medications Ordered in ED Medications  oxyCODONE-acetaminophen (PERCOCET/ROXICET) 5-325 MG per tablet 1 tablet (has no administration in time range)  cyclopentolate (CYCLODRYL,CYCLOGYL) 1 % ophthalmic solution 1 drop (has no  administration in time range)  fluorescein ophthalmic strip 1 strip (1 strip Right Eye Given 01/18/24 2355)  tetracaine (PONTOCAINE) 0.5 % ophthalmic solution 2 drop (2 drops Right Eye Given 01/18/24 2355)  fluorescein 1 MG ophthalmic strip (1 strip  Given 01/19/24 0029)    ED Course/ Medical Decision Making/ A&P                                 Medical Decision Making Risk Prescription drug management.   Presents to the emergency department for evaluation of increased eye pain.  Patient currently being treated for iritis with steroid drops.  Patient had her medication changed yesterday, presumably because she was still experiencing inflammation.  When she put the medicine in tonight she started feeling constant pain.  I suspect that  this was irritation secondary to something in the eyedrop, no evidence of any significant injury or allergic reaction.  Fluorescein exam reveals a punctate area of uptake at the 6:00 area on the periphery.  I suspect she has been rubbing this area because of the inflammation.  This area was scrutinized with a slit lamp and there are no dendritic lesions, no concern for herpes keratitis.  Remainder of slit-lamp exam reveals some anterior chamber cell and flare but pupil is regular and reactive.  This is consistent with iritis.  Patient will be given Percocet for pain relief and Cyclogyl was instilled in the eye to relax the ciliary muscle and help with pain relief.  She will contact Dr. Cathey Endow in the morning for further instructions.        Final Clinical Impression(s) / ED Diagnoses Final diagnoses:  Pain of left eye  Iritis    Rx / DC Orders ED Discharge Orders     None         Nuvia Hileman, Canary Brim, MD 01/19/24 [phone]

## 2024-01-31 ENCOUNTER — Ambulatory Visit: Payer: Medicare Other | Admitting: Neurology

## 2024-01-31 DIAGNOSIS — I10 Essential (primary) hypertension: Secondary | ICD-10-CM

## 2024-01-31 DIAGNOSIS — Z8673 Personal history of transient ischemic attack (TIA), and cerebral infarction without residual deficits: Secondary | ICD-10-CM

## 2024-01-31 DIAGNOSIS — G4733 Obstructive sleep apnea (adult) (pediatric): Secondary | ICD-10-CM | POA: Diagnosis not present

## 2024-01-31 DIAGNOSIS — I2602 Saddle embolus of pulmonary artery with acute cor pulmonale: Secondary | ICD-10-CM

## 2024-01-31 DIAGNOSIS — Z8669 Personal history of other diseases of the nervous system and sense organs: Secondary | ICD-10-CM

## 2024-02-01 NOTE — Progress Notes (Signed)
 [name]  [name] 78 year old female 1946-10-30   HOME SLEEP TEST REPORT ( by Watch PAT)   STUDY DATE:    02-01-2024    ORDERING CLINICIAN: Shawnie Dapper, NP  REFERRING CLINICIAN:  Dr Wylene Simmer, MD    CLINICAL INFORMATION/HISTORY:  acute saddle PE late in 2024, patient is on CPAP for sleep apnea and needs a new device.  06/28/23 ALL:  Jyl returns for follow up for OSA on CPAP. She is doing well. She is using therapy nightly for about 6-7 hours, on average. She denies concerns with machine or supplies. She is not interested in getting a new machine at this time. She was diagnosed with a pulmonary embolus and started on Eliquis. She is recovering well.  EDS 18/24. Will continue to monitor. She was encouraged to work on improving compliance and let me know if daytime sleepiness does not improve. I would prefer to avoid stimulants if possible but may consider as needed if EDS effects daytime functioning.    06-18-2021, CD She is using an AutoSet CPAP device between a minimum pressure of 5 cm H20 and a maximum pressure of 15 cmH2O with 3 cm EPR . She is requiring at the 95th percentile 13.5 cm water.  Her residual AHI is 1.6/h which speaks for an excellent resolution and she has only mild air leakage from the mask.    Epworth sleepiness score: 18/24    BMI: 31 kg/m   Neck Circumference: NA   FINDINGS:   Sleep Summary:   Total Recording Time (hours, min):      7 h and 4 m   Total Sleep Time (hours, min):   6 h and 28 m               Percent REM (%):       22%                                 Respiratory Indices:   Calculated pAHI (per hour):    following CMS criteria , this AHI was 62.1/h and considered severe. There were no central apneas detected by this device but these could be present ( HST devices are underestimating the amount of central apnea).   for the possible presence of central sleep apnea speaks also the higher NREM sleep AHI.                           REM pAHI:   46.1/h                                                NREM pAHI:    82/h                         Positional  AHI:    supine AHI was 61/h and non supine AHI was 62/h, another indicator that this may not be purely  obstructive apnea.        Snoring reached a volume of 44 dB and was present for almost 50% of recorded sleep time ( loud).  Oxygen Saturation Statistics:   Oxygen Saturation (%) Mean:    91%             O2 Saturation Range (%):    between a nadir of 78% and max saturation of 97%.                                     O2 Saturation (minutes) <89%:         32 minutes, 8% of total sleep time .  O2 saturation (minutes) < 90%:        54 minutes, 14% of total sleep time.   Pulse Rate Statistics:   Pulse Mean (bpm):     65 bpm ( this device does not provide cardiac rhythm data).             Pulse Range:   between 55 and 101 bpm.                IMPRESSION:  This HST confirms the presence of severe sleep apnea which I suspect to be complex and not purely obstructive in origin. The higher NREM sleep apnea count and the absence of a positional component contribute to this assumption- But there is a significant other diagnosis.  There is clinically significant sleep hypoxia  with a  relevant  prolonged  total time in hypoxia and by Nadir and mean saturation.  This is certainly related to the PE history.  The patient may feel excessively fatigued and sleepy because of the hypoxia.     RECOMMENDATION: I certainly want the patient to continue using her CPAP as compliantly as possible.  There is no reason to change the settings as these have reduced the AHI very effectively.  In the meantime, I will order a new machine at same settings with the patient's choice of interface  and order a one night ONO while on CPAP.  If hypoxia is not present while on CPAP, we may need to discuss Modafinil/ Armodafinil to treat persistent hypersomnia.       INTERPRETING PHYSICIAN:   Melvyn Novas, MD

## 2024-02-06 ENCOUNTER — Encounter: Payer: Self-pay | Admitting: Family Medicine

## 2024-02-06 ENCOUNTER — Telehealth: Payer: Self-pay | Admitting: Neurology

## 2024-02-06 DIAGNOSIS — G4736 Sleep related hypoventilation in conditions classified elsewhere: Secondary | ICD-10-CM

## 2024-02-06 DIAGNOSIS — Z91199 Patient's noncompliance with other medical treatment and regimen due to unspecified reason: Secondary | ICD-10-CM

## 2024-02-06 DIAGNOSIS — G4730 Sleep apnea, unspecified: Secondary | ICD-10-CM

## 2024-02-06 DIAGNOSIS — G4710 Hypersomnia, unspecified: Secondary | ICD-10-CM

## 2024-02-06 DIAGNOSIS — I2602 Saddle embolus of pulmonary artery with acute cor pulmonale: Secondary | ICD-10-CM

## 2024-02-06 NOTE — Procedures (Signed)
 [name]  [name] 78 year old female 1946-10-30   HOME SLEEP TEST REPORT ( by Watch PAT)   STUDY DATE:    02-01-2024    ORDERING CLINICIAN: Shawnie Dapper, NP  REFERRING CLINICIAN:  Dr Wylene Simmer, MD    CLINICAL INFORMATION/HISTORY:  acute saddle PE late in 2024, patient is on CPAP for sleep apnea and needs a new device.  06/28/23 ALL:  Leah Knight returns for follow up for OSA on CPAP. She is doing well. She is using therapy nightly for about 6-7 hours, on average. She denies concerns with machine or supplies. She is not interested in getting a new machine at this time. She was diagnosed with a pulmonary embolus and started on Eliquis. She is recovering well.  EDS 18/24. Will continue to monitor. She was encouraged to work on improving compliance and let me know if daytime sleepiness does not improve. I would prefer to avoid stimulants if possible but may consider as needed if EDS effects daytime functioning.    06-18-2021, CD She is using an AutoSet CPAP device between a minimum pressure of 5 cm H20 and a maximum pressure of 15 cmH2O with 3 cm EPR . She is requiring at the 95th percentile 13.5 cm water.  Her residual AHI is 1.6/h which speaks for an excellent resolution and she has only mild air leakage from the mask.    Epworth sleepiness score: 18/24    BMI: 31 kg/m   Neck Circumference: NA   FINDINGS:   Sleep Summary:   Total Recording Time (hours, min):      7 h and 4 m   Total Sleep Time (hours, min):   6 h and 28 m               Percent REM (%):       22%                                 Respiratory Indices:   Calculated pAHI (per hour):    following CMS criteria , this AHI was 62.1/h and considered severe. There were no central apneas detected by this device but these could be present ( HST devices are underestimating the amount of central apnea).   for the possible presence of central sleep apnea speaks also the higher NREM sleep AHI.                           REM pAHI:   46.1/h                                                NREM pAHI:    82/h                         Positional  AHI:    supine AHI was 61/h and non supine AHI was 62/h, another indicator that this may not be purely  obstructive apnea.        Snoring reached a volume of 44 dB and was present for almost 50% of recorded sleep time ( loud).  Oxygen Saturation Statistics:   Oxygen Saturation (%) Mean:    91%             O2 Saturation Range (%):    between a nadir of 78% and max saturation of 97%.                                     O2 Saturation (minutes) <89%:         32 minutes, 8% of total sleep time .  O2 saturation (minutes) < 90%:        54 minutes, 14% of total sleep time.   Pulse Rate Statistics:   Pulse Mean (bpm):     65 bpm ( this device does not provide cardiac rhythm data).             Pulse Range:   between 55 and 101 bpm.                IMPRESSION:  This HST confirms the presence of severe sleep apnea which I suspect to be complex and not purely obstructive in origin. The higher NREM sleep apnea count and the absence of a positional component contribute to this assumption- But there is a significant other diagnosis.  There is clinically significant sleep hypoxia  with a  relevant  prolonged  total time in hypoxia and by Nadir and mean saturation.  This is certainly related to the PE history.  The patient may feel excessively fatigued and sleepy because of the hypoxia.     RECOMMENDATION: I certainly want the patient to continue using her CPAP as compliantly as possible.  There is no reason to change the settings as these have reduced the AHI very effectively.  In the meantime, I will order a new machine at same settings with the patient's choice of interface  and order a one night ONO while on CPAP.  If hypoxia is not present while on CPAP, we may need to discuss Modafinil/ Armodafinil to treat persistent hypersomnia.       INTERPRETING PHYSICIAN:   Melvyn Novas, MD

## 2024-02-06 NOTE — Telephone Encounter (Signed)
 Leah Knight, I already ordered DME and ONO. CD         [name]  Leah Knight 78 year old female 1946-07-19   HOME SLEEP TEST REPORT ( by Watch PAT)   STUDY DATE:    02-01-2024    ORDERING CLINICIAN: Shawnie Dapper, NP  REFERRING CLINICIAN:  Dr Wylene Simmer, MD    CLINICAL INFORMATION/HISTORY:  acute saddle PE late in 2024, patient is on CPAP for sleep apnea and needs a new device.  06/28/23 ALL:  Leah Knight returns for follow up for OSA on CPAP. She is doing well. She is using therapy nightly for about 6-7 hours, on average. She denies concerns with machine or supplies. She is not interested in getting a new machine at this time. She was diagnosed with a pulmonary embolus and started on Eliquis. She is recovering well.  EDS 18/24. Will continue to monitor. She was encouraged to work on improving compliance and let me know if daytime sleepiness does not improve. I would prefer to avoid stimulants if possible but may consider as needed if EDS effects daytime functioning.    06-18-2021, CD She is using an AutoSet CPAP device between a minimum pressure of 5 cm H20 and a maximum pressure of 15 cmH2O with 3 cm EPR . She is requiring at the 95th percentile 13.5 cm water.  Her residual AHI is 1.6/h which speaks for an excellent resolution and she has only mild air leakage from the mask.    Epworth sleepiness score: 18/24    BMI: 31 kg/m   Neck Circumference: NA   FINDINGS:   Sleep Summary:   Total Recording Time (hours, min):      7 h and 4 m   Total Sleep Time (hours, min):   6 h and 28 m               Percent REM (%):       22%                                 Respiratory Indices:   Calculated pAHI (per hour):    following CMS criteria , this AHI was 62.1/h and considered severe. There were no central apneas detected by this device but these could be present ( HST devices are underestimating the amount of central apnea).   for the possible presence of central sleep apnea speaks  also the higher NREM sleep AHI.                          REM pAHI:   46.1/h                                                NREM pAHI:    82/h                         Positional  AHI:    supine AHI was 61/h and non supine AHI was 62/h, another indicator that this may not be purely  obstructive apnea.        Snoring reached a volume of 44 dB and was present for almost 50% of recorded sleep time ( loud).  Oxygen Saturation Statistics:   Oxygen Saturation (%) Mean:    91%             O2 Saturation Range (%):    between a nadir of 78% and max saturation of 97%.                                     O2 Saturation (minutes) <89%:         32 minutes, 8% of total sleep time .  O2 saturation (minutes) < 90%:        54 minutes, 14% of total sleep time.   Pulse Rate Statistics:   Pulse Mean (bpm):     65 bpm ( this device does not provide cardiac rhythm data).             Pulse Range:   between 55 and 101 bpm.                IMPRESSION:  This HST confirms the presence of severe sleep apnea which I suspect to be complex and not purely obstructive in origin. The higher NREM sleep apnea count and the absence of a positional component contribute to this assumption- But there is a significant other diagnosis.  There is clinically significant sleep hypoxia  with a  relevant  prolonged  total time in hypoxia and by Nadir and mean saturation.  This is certainly related to the PE history.  The patient may feel excessively fatigued and sleepy because of the hypoxia.     RECOMMENDATION: I certainly want the patient to continue using her CPAP as compliantly as possible.  There is no reason to change the settings as these have reduced the AHI very effectively.  In the meantime, I will order a new machine at same settings with the patient's choice of interface  and order a one night ONO while on CPAP.  If hypoxia is not present while on CPAP, we may need to discuss  Modafinil/ Armodafinil to treat persistent hypersomnia.      INTERPRETING PHYSICIAN:   Melvyn Novas, MD

## 2024-02-27 ENCOUNTER — Other Ambulatory Visit: Payer: Self-pay | Admitting: *Deleted

## 2024-02-27 ENCOUNTER — Telehealth: Payer: Self-pay | Admitting: *Deleted

## 2024-02-27 ENCOUNTER — Telehealth: Payer: Self-pay | Admitting: Family Medicine

## 2024-02-27 DIAGNOSIS — G4730 Sleep apnea, unspecified: Secondary | ICD-10-CM

## 2024-02-27 DIAGNOSIS — G4710 Hypersomnia, unspecified: Secondary | ICD-10-CM

## 2024-02-27 DIAGNOSIS — G4736 Sleep related hypoventilation in conditions classified elsewhere: Secondary | ICD-10-CM

## 2024-02-27 DIAGNOSIS — G4733 Obstructive sleep apnea (adult) (pediatric): Secondary | ICD-10-CM

## 2024-02-27 NOTE — Telephone Encounter (Signed)
 See other phone note from 02/27/24.

## 2024-02-27 NOTE — Telephone Encounter (Signed)
 Received message from [employer]:    New order placed, community message sent back to [employer] that new order put in.

## 2024-02-27 NOTE — Telephone Encounter (Signed)
 Last seen 06/28/23 and next f/u 06/27/24.   Orders placed 02/06/24 :"If patient is due for new CPAP, please replace the current machine at the same settings and replace the mask with the same model. I need an ONO while on CPAP due to documented sleep hypoxemia."   I resent order and marked urgent to [employer] via community message. I called pt and let her know, she verbalized understanding. She will call back if she does not hear from [employer] to process order.

## 2024-02-27 NOTE — Telephone Encounter (Signed)
 Marland Kitchen

## 2024-02-27 NOTE — Telephone Encounter (Signed)
 Patient called and left a voicemail stating she called [employer] about getting her new machine. They stated that they have not received on order from the office yet.

## 2024-02-27 NOTE — Telephone Encounter (Signed)
 I sent another message asking for them to confirm they see order for both new CPAP and ONO, waiting on response.

## 2024-03-17 ENCOUNTER — Emergency Department (HOSPITAL_COMMUNITY)

## 2024-03-17 ENCOUNTER — Encounter (HOSPITAL_COMMUNITY): Payer: Self-pay

## 2024-03-17 ENCOUNTER — Inpatient Hospital Stay (HOSPITAL_COMMUNITY)

## 2024-03-17 ENCOUNTER — Inpatient Hospital Stay (HOSPITAL_COMMUNITY)
Admission: EM | Admit: 2024-03-17 | Discharge: 2024-03-20 | DRG: 165 | Disposition: A | Discharge status: Home / Self Care | Attending: Internal Medicine | Admitting: Internal Medicine

## 2024-03-17 ENCOUNTER — Other Ambulatory Visit: Payer: Self-pay

## 2024-03-17 DIAGNOSIS — Z833 Family history of diabetes mellitus: Secondary | ICD-10-CM

## 2024-03-17 DIAGNOSIS — Z8249 Family history of ischemic heart disease and other diseases of the circulatory system: Secondary | ICD-10-CM | POA: Diagnosis not present

## 2024-03-17 DIAGNOSIS — Z86718 Personal history of other venous thrombosis and embolism: Secondary | ICD-10-CM

## 2024-03-17 DIAGNOSIS — Z8673 Personal history of transient ischemic attack (TIA), and cerebral infarction without residual deficits: Secondary | ICD-10-CM | POA: Diagnosis not present

## 2024-03-17 DIAGNOSIS — I2692 Saddle embolus of pulmonary artery without acute cor pulmonale: Principal | ICD-10-CM

## 2024-03-17 DIAGNOSIS — I10 Essential (primary) hypertension: Secondary | ICD-10-CM | POA: Diagnosis present

## 2024-03-17 DIAGNOSIS — Z7901 Long term (current) use of anticoagulants: Secondary | ICD-10-CM | POA: Diagnosis not present

## 2024-03-17 DIAGNOSIS — I2609 Other pulmonary embolism with acute cor pulmonale: Secondary | ICD-10-CM | POA: Diagnosis present

## 2024-03-17 DIAGNOSIS — E785 Hyperlipidemia, unspecified: Secondary | ICD-10-CM | POA: Diagnosis present

## 2024-03-17 DIAGNOSIS — Z79899 Other long term (current) drug therapy: Secondary | ICD-10-CM | POA: Diagnosis not present

## 2024-03-17 DIAGNOSIS — K219 Gastro-esophageal reflux disease without esophagitis: Secondary | ICD-10-CM | POA: Diagnosis present

## 2024-03-17 DIAGNOSIS — Z86711 Personal history of pulmonary embolism: Secondary | ICD-10-CM | POA: Diagnosis not present

## 2024-03-17 DIAGNOSIS — I2602 Saddle embolus of pulmonary artery with acute cor pulmonale: Secondary | ICD-10-CM | POA: Diagnosis present

## 2024-03-17 DIAGNOSIS — R0902 Hypoxemia: Secondary | ICD-10-CM | POA: Diagnosis present

## 2024-03-17 HISTORY — PX: IR ANGIOGRAM PULMONARY BILATERAL SELECTIVE: IMG664

## 2024-03-17 HISTORY — PX: IR US GUIDE VASC ACCESS RIGHT: IMG2390

## 2024-03-17 HISTORY — PX: IR THROMBECT PRIM MECH INIT (INCLU) MOD SED: IMG2297

## 2024-03-17 HISTORY — PX: IR THROMBECT SEC MECH MOD SED: IMG2299

## 2024-03-17 LAB — CBC WITH DIFFERENTIAL/PLATELET
Abs Immature Granulocytes: 0.04 10*3/uL (ref 0.00–0.07)
Basophils Absolute: 0.1 10*3/uL (ref 0.0–0.1)
Basophils Relative: 1 %
Eosinophils Absolute: 0.1 10*3/uL (ref 0.0–0.5)
Eosinophils Relative: 1 %
HCT: 43.4 % (ref 36.0–46.0)
Hemoglobin: 14.7 g/dL (ref 12.0–15.0)
Immature Granulocytes: 1 %
Lymphocytes Relative: 12 %
Lymphs Abs: 0.9 10*3/uL (ref 0.7–4.0)
MCH: 32 pg (ref 26.0–34.0)
MCHC: 33.9 g/dL (ref 30.0–36.0)
MCV: 94.6 fL (ref 80.0–100.0)
Monocytes Absolute: 0.8 10*3/uL (ref 0.1–1.0)
Monocytes Relative: 10 %
Neutro Abs: 6 10*3/uL (ref 1.7–7.7)
Neutrophils Relative %: 75 %
Platelets: 249 10*3/uL (ref 150–400)
RBC: 4.59 MIL/uL (ref 3.87–5.11)
RDW: 13.6 % (ref 11.5–15.5)
WBC: 7.9 10*3/uL (ref 4.0–10.5)
nRBC: 0 % (ref 0.0–0.2)

## 2024-03-17 LAB — COMPREHENSIVE METABOLIC PANEL WITH GFR
ALT: 19 U/L (ref 0–44)
AST: 19 U/L (ref 15–41)
Albumin: 3.7 g/dL (ref 3.5–5.0)
Alkaline Phosphatase: 69 U/L (ref 38–126)
Anion gap: 8 (ref 5–15)
BUN: 13 mg/dL (ref 8–23)
CO2: 24 mmol/L (ref 22–32)
Calcium: 9.5 mg/dL (ref 8.9–10.3)
Chloride: 109 mmol/L (ref 98–111)
Creatinine, Ser: 1.01 mg/dL — ABNORMAL HIGH (ref 0.44–1.00)
GFR, Estimated: 58 mL/min — ABNORMAL LOW (ref >60)
Glucose, Bld: 132 mg/dL — ABNORMAL HIGH (ref 70–99)
Potassium: 4 mmol/L (ref 3.5–5.1)
Sodium: 141 mmol/L (ref 135–145)
Total Bilirubin: 0.8 mg/dL (ref 0.0–1.2)
Total Protein: 7.3 g/dL (ref 6.5–8.1)

## 2024-03-17 LAB — RESP PANEL BY RT-PCR (RSV, FLU A&B, COVID)  RVPGX2
Influenza A by PCR: NEGATIVE
Influenza B by PCR: NEGATIVE
Resp Syncytial Virus by PCR: NEGATIVE
SARS Coronavirus 2 by RT PCR: NEGATIVE

## 2024-03-17 LAB — MRSA NEXT GEN BY PCR, NASAL: MRSA by PCR Next Gen: NOT DETECTED

## 2024-03-17 LAB — TROPONIN I (HIGH SENSITIVITY)
Troponin I (High Sensitivity): 124 ng/L (ref <18)
Troponin I (High Sensitivity): 97 ng/L — ABNORMAL HIGH (ref <18)

## 2024-03-17 LAB — BRAIN NATRIURETIC PEPTIDE: B Natriuretic Peptide: 142 pg/mL — ABNORMAL HIGH (ref 0.0–100.0)

## 2024-03-17 LAB — GLUCOSE, CAPILLARY: Glucose-Capillary: 127 mg/dL — ABNORMAL HIGH (ref 70–99)

## 2024-03-17 LAB — HEPARIN LEVEL (UNFRACTIONATED): Heparin Unfractionated: >1.1 [IU]/mL — ABNORMAL HIGH (ref 0.30–0.70)

## 2024-03-17 MED ORDER — FENTANYL CITRATE (PF) 100 MCG/2ML IJ SOLN
INTRAMUSCULAR | Status: AC
  Filled 2024-03-17: qty 2

## 2024-03-17 MED ORDER — MIDAZOLAM HCL 2 MG/2ML IJ SOLN
INTRAMUSCULAR | Status: AC | PRN
  Administered 2024-03-17: 1 mg via INTRAVENOUS
  Administered 2024-03-17 (×2): .5 mg via INTRAVENOUS

## 2024-03-17 MED ORDER — POLYETHYLENE GLYCOL 3350 17 G PO PACK
17.0000 g | PACK | Freq: Every day | ORAL | Status: DC | PRN
  Administered 2024-03-18: 17 g via ORAL
  Filled 2024-03-17: qty 1

## 2024-03-17 MED ORDER — IOHEXOL 300 MG/ML  SOLN
150.0000 mL | Freq: Once | INTRAMUSCULAR | Status: AC | PRN
  Administered 2024-03-17: 70 mL via INTRA_ARTERIAL

## 2024-03-17 MED ORDER — HEPARIN SODIUM (PORCINE) 1000 UNIT/ML IJ SOLN
INTRAMUSCULAR | Status: AC
  Filled 2024-03-17: qty 10

## 2024-03-17 MED ORDER — HEPARIN BOLUS VIA INFUSION
5000.0000 [IU] | Freq: Once | INTRAVENOUS | Status: AC
  Administered 2024-03-17: 5000 [IU] via INTRAVENOUS
  Filled 2024-03-17: qty 5000

## 2024-03-17 MED ORDER — LIDOCAINE HCL 1 % IJ SOLN
INTRAMUSCULAR | Status: AC
  Filled 2024-03-17: qty 20

## 2024-03-17 MED ORDER — CHLORHEXIDINE GLUCONATE CLOTH 2 % EX PADS
6.0000 | MEDICATED_PAD | Freq: Every day | CUTANEOUS | Status: DC
  Administered 2024-03-17 – 2024-03-18 (×2): 6 via TOPICAL

## 2024-03-17 MED ORDER — MIDAZOLAM HCL 2 MG/2ML IJ SOLN
INTRAMUSCULAR | Status: AC
  Filled 2024-03-17: qty 2

## 2024-03-17 MED ORDER — FENTANYL CITRATE (PF) 100 MCG/2ML IJ SOLN
INTRAMUSCULAR | Status: AC | PRN
  Administered 2024-03-17 (×4): 25 ug via INTRAVENOUS

## 2024-03-17 MED ORDER — IOHEXOL 350 MG/ML SOLN
75.0000 mL | Freq: Once | INTRAVENOUS | Status: AC | PRN
  Administered 2024-03-17: 75 mL via INTRAVENOUS

## 2024-03-17 MED ORDER — DOCUSATE SODIUM 100 MG PO CAPS
100.0000 mg | ORAL_CAPSULE | Freq: Two times a day (BID) | ORAL | Status: DC | PRN
  Administered 2024-03-18: 100 mg via ORAL
  Filled 2024-03-17: qty 1

## 2024-03-17 MED ORDER — HEPARIN (PORCINE) 25000 UT/250ML-% IV SOLN
1200.0000 [IU]/h | INTRAVENOUS | Status: DC
  Administered 2024-03-17: 1200 [IU]/h via INTRAVENOUS
  Filled 2024-03-17: qty 250

## 2024-03-17 NOTE — Consult Note (Signed)
 Chief Complaint: Patient was seen in consultation today for submassive pulmonary embolism at the request of Dr. Dione Franks.  Referring Physician(s): Dr. Louie Rover  Patient Status: [facility] - In-pt  History of Present Illness: Leah Knight is a 78 y.o. female known to our service from prior bilateral pulmonary thrombectomy on 05/31/2023 to treat saddle PE. She did very well after that procedure and was anticoagulated on Eliquis until December. She was well until she awoke this morning when she experienced sudden shortness of bed when rising from bed and could barely ambulate. She felt syncopal at that time but did not lose consciousness or fall and was able to call 911.  She has not had any recent symptoms, lower extremity edema/pain, illness, surgery or new systemic diagnoses. 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   CTA of chest at 11:52 AM today demonstrates saddle PE with significant right PA thrombus extending into RUL, RML and RLL pulmonary arteries. Slightly less burden on left with mostly LLL thrombus. Pattern of PE is almost identical to presentation last June. RV/LV ratio somewhat difficult to calculate due to LVH, but estimated to be around 1.3 by CTA. Troponin peaked at 124 with second level of 97. BNP 142. Currently hemodynamically stable and sats in 90's on 2L O2.   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   Hx of retinal hemorrhage    Hyperlipemia    Hypertension    Sleep apnea    Stroke [facility])     Past Surgical History:  Procedure Laterality Date   CATARACT EXTRACTION, BILATERAL  summer 2021   childbirth     x 2   IR ANGIOGRAM PULMONARY BILATERAL SELECTIVE  05/31/2023   IR ANGIOGRAM SELECTIVE EACH  ADDITIONAL VESSEL  05/31/2023   IR ANGIOGRAM SELECTIVE EACH ADDITIONAL VESSEL  05/31/2023   IR THROMBECT PRIM MECH INIT (INCLU) MOD SED  05/31/2023   IR THROMBECT PRIM MECH INIT (INCLU) MOD SED  05/31/2023   IR US  GUIDE VASC ACCESS RIGHT  05/31/2023    Allergies: Pholcodine and Sulfa antibiotics  Medications: Prior to Admission medications   Medication Sig Start Date End Date Taking? Authorizing Provider  loratadine (CLARITIN) 10 MG tablet Take 10 mg by mouth daily as needed for allergies or rhinitis.   Yes [provider]  losartan (COZAAR) 50 MG tablet Take 50 mg by mouth daily.   Yes [provider]  propranolol (INDERAL) 40 MG tablet Take 40 mg by mouth daily.   Yes [provider]  TUMS 500 MG chewable tablet Chew 1-2 tablets by mouth 2 (two) times daily as needed for indigestion or heartburn.   Yes [provider]  TYLENOL 8 HOUR ARTHRITIS PAIN 650 MG CR tablet Take 650 mg by mouth every 8 (eight) hours as needed for pain.   Yes [provider]  apixaban (ELIQUIS) 5 MG TABS tablet Take 10 mg (2 tablets) by mouth twice daily for 7 days-on 06/08/2023-switch to 5 mg (1 tablet) by mouth daily Patient not taking: Reported on 03/17/2024 06/02/23   Burton Casey, MD  omeprazole (PRILOSEC) 40 MG capsule Take 1 capsule (40 mg total) by mouth in the morning and  at bedtime. Patient not taking: Reported on 03/17/2024 10/20/22   Lindle Rhea, MD     Family History  Problem Relation Age of Onset   Heart failure Mother    Hypertension Mother    Pneumonia Father    Diabetes Father    Hypertension Brother    Diabetes Brother    Colon cancer Neg Hx    Esophageal cancer Neg Hx    Rectal cancer Neg Hx    Stomach cancer Neg Hx     Social History   Socioeconomic History   Marital status: Widowed    Spouse name: Not on file   Number of children: 2   Years of education: Not on file   Highest education level: Not on file  Occupational History    Not on file  Tobacco Use   Smoking status: Never   Smokeless tobacco: Never  Substance and Sexual Activity   Alcohol use: Not Currently   Drug use: Not Currently   Sexual activity: Not on file  Other Topics Concern   Not on file  Social History Narrative   Lives alone    Right handed   Caffeine: 1-2 cups/day   Social Drivers of Health   Financial Resource Strain: Not on file  Food Insecurity: No Food Insecurity (03/17/2024)   Hunger Vital Sign    Worried About Running Out of Food in the Last Year: Never true    Ran Out of Food in the Last Year: Never true  Transportation Needs: No Transportation Needs (05/31/2023)   PRAPARE - Administrator, Civil Service (Medical): No    Lack of Transportation (Non-Medical): No  Physical Activity: Not on file  Stress: Not on file  Social Connections: Not on file     Review of Systems: A 12 point ROS discussed and pertinent positives are indicated in the HPI above.  All other systems are negative.  Review of Systems  Constitutional: Negative.   Respiratory:  Positive for shortness of breath.   Cardiovascular: Negative.   Genitourinary: Negative.   Musculoskeletal: Negative.   Neurological:  Positive for syncope.    Vital Signs: BP (!) 145/94   Pulse 89   Temp 98.6 F (37 C) (Oral)   Resp 16   Ht 5\' 2"  (1.575 m)   Wt 70.5 kg   SpO2 94%   BMI 28.43 kg/m     Physical Exam Vitals reviewed.  Neurological:     Mental Status: She is alert.     Imaging: CT Angio Chest PE W and/or Wo Contrast Result Date: 03/17/2024 CLINICAL DATA:  Chest pain. Shortness of breath. History of prior PE EXAM: CT ANGIOGRAPHY CHEST WITH CONTRAST TECHNIQUE: Multidetector CT imaging of the chest was performed using the standard protocol during bolus administration of intravenous contrast. Multiplanar CT image reconstructions and MIPs were obtained to evaluate the vascular anatomy. RADIATION DOSE REDUCTION: This exam was performed according  to the departmental dose-optimization program which includes automated exposure control, adjustment of the mA and/or kV according to patient size and/or use of iterative reconstruction technique. CONTRAST:  75mL OMNIPAQUE IOHEXOL 350 MG/ML SOLN COMPARISON:  CTA 05/31/2023 FINDINGS: Cardiovascular: Scattered vascular calcifications identified along the thoracic aorta and coronary arteries. Normal course and caliber to the thoracic aorta. Heart is nonenlarged. There is some left ventricular wall hypertrophy. Please correlate for any history of hypertension. Large pulmonary emboli identified including a saddle embolus across the bifurcation of the main pulmonary artery. Significant emboli along the lobar and  segmental vessels more in the lower lobes and right upper lobe in particular. There is also some reflux of contrast into the intrahepatic veins and IVC. Slight flattening of the intraventricular septum. Please correlate for any clinical evidence of right heart strain. Mediastinum/Nodes: Preserved thyroid gland. Slightly patulous thoracic esophagus small hiatal hernia. No specific abnormal lymph node enlargement identified in the axillary regions, hilum or mediastinum. Exception is a small subcarinal node identified measuring 9 mm in short axis on series 6, image 52. Not simply changed when adjusted for technique from the prior. Lungs/Pleura: Breathing motion identified. No consolidation, pneumothorax or effusion. Mild scattered ground-glass, mosaic appearance of the lungs. Upper Abdomen: Adrenal glands are preserved in the upper abdomen. Musculoskeletal: Scattered degenerative changes along the spine with disc height loss and osteophytes. Review of the MIP images confirms the above findings. Critical Value/emergent results were called by telephone at the time of interpretation on 03/17/2024 at 12:14 pm to provider [facility] , who verbally acknowledged these results. IMPRESSION: Large, extensive pulmonary  emboli including central saddle emboli. There are some changes suggestive of potential right heart strain. Please correlate with clinical presentation. Small hiatal hernia. Aortic Atherosclerosis (ICD10-I70.0). Electronically Signed   By: Adrianna Horde M.D.   On: 03/17/2024 12:15    Labs:  CBC: Recent Labs    05/31/23 0816 06/01/23 0607 06/02/23 0546 03/17/24 1015  WBC 8.4 7.8 7.1 7.9  HGB 13.9 11.2* 10.6* 14.7  HCT 41.8 33.0* 32.2* 43.4  PLT 179 148* 155 249    COAGS: Recent Labs    05/31/23 0816  INR 1.1    BMP: Recent Labs    05/31/23 0816 06/01/23 0607 03/17/24 1015  NA 139 140 141  K 4.3 3.6 4.0  CL 108 105 109  CO2 21* 22 24  GLUCOSE 126* 125* 132*  BUN 17 11 13   CALCIUM 9.0 8.4* 9.5  CREATININE 1.25* 1.00 1.01*  GFRNONAA 45* 59* 58*    LIVER FUNCTION TESTS: Recent Labs    03/17/24 1015  BILITOT 0.8  AST 19  ALT 19  ALKPHOS 69  PROT 7.3  ALBUMIN 3.7    Assessment and Plan:  I had a detailed discussion with Leah Knight and her daughter Leah Knight. We reviewed options for further management of submassive/near massive PE including standard anticoagulation, systemic lytic therapy, catheter directed lytic therapy and catheter suction thrombectomy. She had a good outcome after suction thrombectomy last June and would favor repeating thrombectomy. I reviewed risks of that procedure with her including arrhythmia, cardiac damage, pulmonary hemorrhage, pulmonary artery injury and death. The benefits would be quicker recovery from her PE and lower risk of post-thrombotic sequelae such as pulmonary hypertension, cardiac dysfunction/failure, dyspnea with exertion or oxygen dependence.   After discussion, Leah Knight and her daughter are in agreement and would like to proceed with pulmonary arteriography and pulmonary thrombectomy tonight. She was also consented for catheter directed thrombolytic therapy should thrombectomy be incomplete or suboptimal at the time of  the procedure.  Thank you for this interesting consult.  I greatly enjoyed meeting Leah Knight [facility] and look forward to participating in their care.  A copy of this report was sent to the requesting provider on this date.  Electronically Signed: Aileen Alexanders, MD 03/17/2024, 7:23 PM    I spent a total of 40 Minutes  in face to face in clinical consultation, greater than 50% of which was counseling/coordinating care for treatment of submassive acute pulmonary embolism.

## 2024-03-17 NOTE — H&P (Addendum)
 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  At that time she was treated with mechanical thrombectomy due to concern for retinal hemorrhage systemic thrombolysis was not pursued.  She had a good outcome was discharged on room air and transition to Eliquis.  She followed up in the hematology clinic with Dr. Lambert Pillion where her hypercoagulable workup was negative.  They made a shared decision to stop anticoagulation at that time.  She presents with acute onset of chest pain and difficulty breathing this morning while she bent over to pick something up from the floor when she woke up.  She said it felt exactly like her pulmonary embolism symptoms.  She denies any recent periods of prolonged immobility.  She has a sedentary job.  She notes a couple different respiratory infections over the last few months but otherwise no significant morbidity.  She denies any complications for bleeding with Eliquis.  She has never had any intracranial hemorrhage or stroke.  She has never had cancer.  She says she is up-to-date on her cancer screening.  She has no family history of pulmonary embolism or venous thromboembolism.  Pulmonary is being consulted to assist with management of acute pulmonary embolism, unprovoked, recurrent with evidence of right heart strain and saddle distribution.  Pertinent  Medical History  Acute submassive pulmonary embolism in 2024  Significant Hospital Events: Including procedures, antibiotic start and stop dates in addition to other pertinent events     Interim History / Subjective:    Objective   Blood pressure (!) 115/105, pulse 95, temperature  98 F (36.7 C), temperature source Oral, resp. rate (!) 23, height 5\' 2"  (1.575 m), weight 71.7 kg, SpO2 94%.       No intake or output data in the 24 hours ending 03/17/24 1314 Filed Weights   03/17/24 1243  Weight: 71.7 kg    Examination: General: Sitting up in bed, on nasal cannula HENT: Mucous membranes moist Lungs: Clear to auscultation bilaterally no wheezes or crackles Cardiovascular: Tachycardic, regular Abdomen: Abdomen soft, nontender Extremities: No peripheral edema or erythema Neuro: Anxious but otherwise ANO x 4 no focal asymmetry GU: Deferred  Resolved Hospital Problem list     Assessment & Plan:    Acute pulmonary embolism, submassive, saddle distribution, recurrent - Troponin and BNP are elevated - PESI score intermediate - I reviewed the treatment options with her including heparin alone, half dose systemic thrombolysis, mechanical thrombectomy with assistance of interventional radiology.  She would like to proceed with thrombectomy.  She had a good outcome this last time. - I have contacted radiology and they will get back to me after they are done with another case - In the meantime I will admit to ICU with anticipation for thrombectomy later today - N.p.o., continue heparin drip  ADDENDUM: Discussed case with IR. Thrombectomy is only done at [facility] not [facility]. Admission order placed to [facility]. I went down and discussed with the patient again her options, this time with her daughter. Options include heparin alone, half dose systemic tpa, catheter directed tpa, and thrombectomy. I recommend one of the latter three based on her risk profile and pesi score. We reviewed some  risks and benefits. Systemic thrombolysis is far and away the simplest and fastest option. Procedures with catheters carry risk for heart arrhythmia, cardiac arrest. Anything with thrombolysis conveys bleeding risk. The thrombectomy catheter is larger than the thrombolysis catheter. She is still unsure and  is wanting to consider her options.  Admission order placed to [facility].  Additional cc time 30 minutes  Best Practice (right click and "Reselect all SmartList Selections" daily)   Diet/type: NPO DVT prophylaxis systemic heparin Pressure ulcer(s): pressure ulcer assessment deferred  GI prophylaxis: N/A Lines: N/A Foley:  N/A Code Status:  full code Last date of multidisciplinary goals of care discussion []   Labs   CBC: Recent Labs  Lab 03/17/24 1015  WBC 7.9  NEUTROABS 6.0  HGB 14.7  HCT 43.4  MCV 94.6  PLT 249    Basic Metabolic Panel: Recent Labs  Lab 03/17/24 1015  NA 141  K 4.0  CL 109  CO2 24  GLUCOSE 132*  BUN 13  CREATININE 1.01*  CALCIUM 9.5   GFR: Estimated Creatinine Clearance: 43.9 mL/min (A) (by C-G formula based on SCr of 1.01 mg/dL (H)). Recent Labs  Lab 03/17/24 1015  WBC 7.9    Liver Function Tests: Recent Labs  Lab 03/17/24 1015  AST 19  ALT 19  ALKPHOS 69  BILITOT 0.8  PROT 7.3  ALBUMIN 3.7   No results for input(s): "LIPASE", "AMYLASE" in the last 168 hours. No results for input(s): "AMMONIA" in the last 168 hours.  ABG No results found for: "PHART", "PCO2ART", "PO2ART", "HCO3", "TCO2", "ACIDBASEDEF", "O2SAT"   Coagulation Profile: No results for input(s): "INR", "PROTIME" in the last 168 hours.  Cardiac Enzymes: No results for input(s): "CKTOTAL", "CKMB", "CKMBINDEX", "TROPONINI" in the last 168 hours.  HbA1C: No results found for: "HGBA1C"  CBG: No results for input(s): "GLUCAP" in the last 168 hours.  Review of Systems:   As above in HPI  Past Medical History:  She,  has a past medical history of Allergy, Arthritis, GERD (gastroesophageal reflux disease), H/O: stroke (2000), retinal hemorrhage, Hyperlipemia, Hypertension, Sleep apnea, and Stroke (HCC).   Surgical History:   Past Surgical History:  Procedure Laterality Date   CATARACT EXTRACTION, BILATERAL  summer 2021   childbirth     x 2   IR ANGIOGRAM  PULMONARY BILATERAL SELECTIVE  05/31/2023   IR ANGIOGRAM SELECTIVE EACH ADDITIONAL VESSEL  05/31/2023   IR ANGIOGRAM SELECTIVE EACH ADDITIONAL VESSEL  05/31/2023   IR THROMBECT PRIM MECH INIT (INCLU) MOD SED  05/31/2023   IR THROMBECT PRIM MECH INIT (INCLU) MOD SED  05/31/2023   IR US  GUIDE VASC ACCESS RIGHT  05/31/2023     Social History:   reports that she has never smoked. She has never used smokeless tobacco. She reports that she does not currently use alcohol. She reports that she does not currently use drugs.   Family History:  Her family history includes Diabetes in her brother and father; Heart failure in her mother; Hypertension in her brother and mother; Pneumonia in her father. There is no history of Colon cancer, Esophageal cancer, Rectal cancer, or Stomach cancer.   Allergies Allergies  Allergen Reactions   Sulfa Antibiotics Itching     Home Medications  Prior to Admission medications   Medication Sig Start Date End Date Taking? Authorizing Provider  apixaban (ELIQUIS) 5 MG TABS tablet Take 10 mg (2 tablets) by mouth twice daily for 7 days-on 06/08/2023-switch to 5 mg (1 tablet) by mouth daily 06/02/23  Ghimire, Estil Heman, MD  atorvastatin (LIPITOR) 10 MG tablet Take 10 mg by mouth daily.    [provider]  losartan (COZAAR) 50 MG tablet Take 50 mg by mouth daily.    [provider]  omeprazole (PRILOSEC) 40 MG capsule Take 1 capsule (40 mg total) by mouth in the morning and at bedtime. Patient taking differently: Take 40 mg by mouth daily. 10/20/22   Lindle Rhea, MD  propranolol (INDERAL) 40 MG tablet Take 40 mg by mouth daily.    [provider]  TURMERIC CURCUMIN PO Take 1 tablet by mouth daily.    [provider]  VITAMIN E PO Take 1 capsule by mouth daily.    [provider]     Critical care time:      The patient is critically ill due to acute pulmonary embolism.  Critical care was necessary to treat or prevent  imminent or life-threatening deterioration.  Critical care was time spent personally by me on the following activities: development of treatment plan with patient and/or surrogate as well as nursing, discussions with consultants, evaluation of patient's response to treatment, examination of patient, obtaining history from patient or surrogate, ordering and performing treatments and interventions, ordering and review of laboratory studies, ordering and review of radiographic studies, pulse oximetry, re-evaluation of patient's condition and participation in multidisciplinary rounds.   Critical Care Time devoted to patient care services described in this note is 45 minutes. This time reflects time of care of this signee May Ozment S Akshath Mccarey . This critical care time does not reflect separately billable procedures or procedure time, teaching time or supervisory time of PA/NP/Med student/Med Resident etc but could involve care discussion time.       Aleck Hurdle [facility] 03/17/2024 2:00 PM  Pager: see AMION  If no response to pager , please call critical care on call (see AMION) until 7pm After 7:00 pm call Elink

## 2024-03-17 NOTE — ED Provider Notes (Signed)
 [place] EMERGENCY DEPARTMENT AT [facility] Provider Note   CSN: [id number] Arrival date & time: 03/17/24  0900     History  Chief Complaint  Patient presents with   Shortness of Breath    Leah Knight is a 78 y.o. female.  The history is provided by the patient and medical records. No language interpreter was used.  Shortness of Breath Severity:  Severe Onset quality:  Sudden Duration:  1 day Timing:  Constant Progression:  Waxing and waning Chronicity:  Recurrent Context: not URI   Relieved by:  Nothing Worsened by:  Deep breathing and exertion Ineffective treatments:  None tried Associated symptoms: chest pain   Associated symptoms: no abdominal pain, no cough, no fever, no headaches, no sputum production, no vomiting and no wheezing   Risk factors: hx of PE/DVT        Home Medications Prior to Admission medications   Medication Sig Start Date End Date Taking? Authorizing Provider  apixaban (ELIQUIS) 5 MG TABS tablet Take 10 mg (2 tablets) by mouth twice daily for 7 days-on 06/08/2023-switch to 5 mg (1 tablet) by mouth daily 06/02/23   Ghimire, Estil Heman, MD  atorvastatin (LIPITOR) 10 MG tablet Take 10 mg by mouth daily.    [provider]  losartan (COZAAR) 50 MG tablet Take 50 mg by mouth daily.    [provider]  omeprazole (PRILOSEC) 40 MG capsule Take 1 capsule (40 mg total) by mouth in the morning and at bedtime. Patient taking differently: Take 40 mg by mouth daily. 10/20/22   Lindle Rhea, MD  propranolol (INDERAL) 40 MG tablet Take 40 mg by mouth daily.    [provider]  TURMERIC CURCUMIN PO Take 1 tablet by mouth daily.    [provider]  VITAMIN E PO Take 1 capsule by mouth daily.    [provider]      Allergies    Sulfa antibiotics    Review of Systems   Review of Systems  Constitutional:  Negative for chills, fatigue and fever.  HENT:  Negative for congestion.   Respiratory:   Positive for chest tightness and shortness of breath. Negative for cough, sputum production, wheezing and stridor.   Cardiovascular:  Positive for chest pain. Negative for palpitations and leg swelling.  Gastrointestinal:  Negative for abdominal pain, constipation, diarrhea, nausea and vomiting.  Genitourinary:  Negative for dysuria.  Musculoskeletal:  Negative for back pain.  Neurological:  Negative for headaches.  Psychiatric/Behavioral:  Negative for agitation.     Physical Exam Updated Vital Signs BP (!) 157/114   Pulse (!) 102   Temp 97.6 F (36.4 C)   Resp 20   SpO2 93%  Physical Exam Vitals and nursing note reviewed.  Constitutional:      General: She is not in acute distress.    Appearance: She is well-developed. She is ill-appearing. She is not toxic-appearing or diaphoretic.  HENT:     Head: Normocephalic and atraumatic.  Eyes:     Conjunctiva/sclera: Conjunctivae normal.     Pupils: Pupils are equal, round, and reactive to light.  Cardiovascular:     Rate and Rhythm: Regular rhythm. Tachycardia present.     Heart sounds: No murmur heard. Pulmonary:     Effort: Pulmonary effort is normal. Tachypnea present. No respiratory distress.     Breath sounds: Normal breath sounds. No decreased breath sounds, wheezing, rhonchi or rales.  Chest:     Chest wall: No tenderness.  Abdominal:     Palpations: Abdomen is soft.     Tenderness: There is no abdominal tenderness.  Musculoskeletal:        General: No swelling.     Cervical back: Neck supple.     Right lower leg: No edema.     Left lower leg: No edema.  Skin:    General: Skin is warm and dry.     Capillary Refill: Capillary refill takes less than 2 seconds.     Findings: No erythema.  Neurological:     General: No focal deficit present.     Mental Status: She is alert.  Psychiatric:        Mood and Affect: Mood normal.     ED Results / Procedures / Treatments   Labs (all labs ordered are listed, but only  abnormal results are displayed) Labs Reviewed  COMPREHENSIVE METABOLIC PANEL WITH GFR - Abnormal; Notable for the following components:      Result Value   Glucose, Bld 132 (*)    Creatinine, Ser 1.01 (*)    GFR, Estimated 58 (*)    All other components within normal limits  BRAIN NATRIURETIC PEPTIDE - Abnormal; Notable for the following components:   B Natriuretic Peptide 142.0 (*)    All other components within normal limits  TROPONIN I (HIGH SENSITIVITY) - Abnormal; Notable for the following components:   Troponin I (High Sensitivity) 124 (*)    All other components within normal limits  RESP PANEL BY RT-PCR (RSV, FLU A&B, COVID)  [id number]  CBC WITH DIFFERENTIAL/PLATELET  TROPONIN I (HIGH SENSITIVITY)    EKG EKG Interpretation Date/Time:  Saturday March 17 2024 09:16:39 EDT Ventricular Rate:  97 PR Interval:  168 QRS Duration:  86 QT Interval:  376 QTC Calculation: 478 R Axis:   25  Text Interpretation: Sinus rhythm Probable left atrial enlargement Inferior infarct, old Consider anterior infarct when compared to prior, similar appearance No STEMI Confirmed by Wynell Heath ([id number]) on 03/17/2024 12:38:09 PM  Radiology CT Angio Chest PE W and/or Wo Contrast Result Date: 03/17/2024 CLINICAL DATA:  Chest pain. Shortness of breath. History of prior PE EXAM: CT ANGIOGRAPHY CHEST WITH CONTRAST TECHNIQUE: Multidetector CT imaging of the chest was performed using the standard protocol during bolus administration of intravenous contrast. Multiplanar CT image reconstructions and MIPs were obtained to evaluate the vascular anatomy. RADIATION DOSE REDUCTION: This exam was performed according to the departmental dose-optimization program which includes automated exposure control, adjustment of the mA and/or kV according to patient size and/or use of iterative reconstruction technique. CONTRAST:  75mL OMNIPAQUE IOHEXOL 350 MG/ML SOLN COMPARISON:  CTA 05/31/2023 FINDINGS: Cardiovascular: Scattered  vascular calcifications identified along the thoracic aorta and coronary arteries. Normal course and caliber to the thoracic aorta. Heart is nonenlarged. There is some left ventricular wall hypertrophy. Please correlate for any history of hypertension. Large pulmonary emboli identified including a saddle embolus across the bifurcation of the main pulmonary artery. Significant emboli along the lobar and segmental vessels Knight in the lower lobes and right upper lobe in particular. There is also some reflux of contrast into the intrahepatic veins and IVC. Slight flattening of the intraventricular septum. Please correlate for any clinical evidence of right heart strain. Mediastinum/Nodes: Preserved thyroid gland. Slightly patulous thoracic esophagus small hiatal hernia. No specific abnormal lymph node enlargement identified in the axillary regions, hilum or mediastinum. Exception is a small subcarinal node identified measuring 9 mm in short axis on series 6, image 52. Not  simply changed when adjusted for technique from the prior. Lungs/Pleura: Breathing motion identified. No consolidation, pneumothorax or effusion. Mild scattered ground-glass, mosaic appearance of the lungs. Upper Abdomen: Adrenal glands are preserved in the upper abdomen. Musculoskeletal: Scattered degenerative changes along the spine with disc height loss and osteophytes. Review of the MIP images confirms the above findings. Critical Value/emergent results were called by telephone at the time of interpretation on 03/17/2024 at 12:14 pm to provider [facility] , who verbally acknowledged these results. IMPRESSION: Large, extensive pulmonary emboli including central saddle emboli. There are some changes suggestive of potential right heart strain. Please correlate with clinical presentation. Small hiatal hernia. Aortic Atherosclerosis (ICD10-I70.0). Electronically Signed   By: Adrianna Horde M.D.   On: 03/17/2024 12:15    Procedures Procedures     CRITICAL CARE Performed by: Marine Sia Amyia Lodwick Total critical care time: 40 minutes Critical care time was exclusive of separately billable procedures and treating other patients. Critical care was necessary to treat or prevent imminent or life-threatening deterioration. Critical care was time spent personally by me on the following activities: development of treatment plan with patient and/or surrogate as well as nursing, discussions with consultants, evaluation of patient's response to treatment, examination of patient, obtaining history from patient or surrogate, ordering and performing treatments and interventions, ordering and review of laboratory studies, ordering and review of radiographic studies, pulse oximetry and re-evaluation of patient's condition.  Medications Ordered in ED Medications  iohexol (OMNIPAQUE) 350 MG/ML injection 75 mL (75 mLs Intravenous Contrast Given 03/17/24 1153)    ED Course/ Medical Decision Making/ A&P                                 Medical Decision Making Amount and/or Complexity of Data Reviewed Labs: ordered. Radiology: ordered.  Risk Prescription drug management. Decision regarding hospitalization.    Leah Knight is a 78 y.o. female with a past medical history significant for hypertension, dyslipidemia, CKD, and previous saddle pulmonary embolism who presents with pleuritic chest pain and shortness of breath.  According to patient, she was feeling normal last night going to bed and woke up this morning with shortness of breath and chest tightness.  She reports it feels the same as her previous saddle pulmonary embolism.  She reports no nausea, vomiting, constipation, diarrhea, or urinary changes.  She denies any leg pain or leg swelling.  She reports she had her Eliquis stopped in December and had done well since.  She had no other preceding symptoms but reports the chest tightness is severe.  She denies other productive cough fevers or  chills.  On exam, lungs were clear and chest was nontender.  Abdomen nontender.  Did not appreciate a murmur.  She has pulses in extremities and otherwise is resting but is tachycardic and tachypneic.  Oxygen saturation dipped to 89% when I was talking with her.  Will place her on oxygen to help support her breathing.  Clinically given her history and similar symptoms I am concerned she has a large pulmonary Blossom again.  Will get CT PE study and get labs.  Will place her on oxygen.  Anticipate admission depending on workup results.  Patient's troponin came back elevated at 124.  I looked at the images myself and am concerned about recurrent saddle pulmonary embolism.  I called  radiology who agreed with saddle pulmonary embolism with right heart strain.   Critical care was called  who recommended initiation of heparin and they will come see the patient to discuss management plan.  Patient will be admitted for further management after they see critical care.        Final Clinical Impression(s) / ED Diagnoses Final diagnoses:  Acute saddle pulmonary embolism, unspecified whether acute cor pulmonale present (HCC)  Hypoxia    Clinical Impression: 1. Acute saddle pulmonary embolism, unspecified whether acute cor pulmonale present (HCC)   2. Hypoxia     Disposition: Admit  This note was prepared with assistance of Dragon voice recognition software. Occasional wrong-word or sound-a-like substitutions may have occurred due to the inherent limitations of voice recognition software.      Michoel Kunin, Marine Sia, MD 03/17/24 [phone]

## 2024-03-17 NOTE — Progress Notes (Signed)
 ACT- 182

## 2024-03-17 NOTE — Procedures (Signed)
 Interventional Radiology Procedure Note  Procedure: Bilateral selective pulmonary arteriography; bilateral pulmonary arterial mechanical suction thrombectomy  Complications: None  Estimated Blood Loss: 50 mL  Access: Right CFV, 24 Fr sheath  Findings: Extensive bilateral PE, including saddle component. Pre-thrombectomy PA pressure: 75/30, mean 48 mm Hg.  Bilateral suction thrombectomy performed with Inari device with removal of moderate thrombus, some of which appeared chronic.  Post right and left pulmonary arteriography shows dramatic improvement in patency of pulmonary arteries with only small areas of peripheral thrombus seen.  Completion PA pressure: 51/22, mean 34 mm Hg.  Prolene purse string suture applied at femoral access site. Will assess tomorrow and likely remove purse-string suture tomorrow or Monday.  Leah Knight. Leah Knight, M.D Pager:  [phone]

## 2024-03-17 NOTE — Progress Notes (Signed)
 PHARMACY - ANTICOAGULATION CONSULT NOTE  Pharmacy Consult for IV heparin Indication: new PE  Allergies  Allergen Reactions   Sulfa Antibiotics Itching    Patient Measurements: Height: 5\' 2"  (157.5 cm) Weight: 71.7 kg (158 lb) IBW/kg (Calculated) : 50.1 HEPARIN DW (KG): 65.3  Vital Signs: Temp: 97.6 F (36.4 C) (04/12 0906) BP: 123/83 (04/12 1130) Pulse Rate: 96 (04/12 1130)  Labs: Recent Labs    03/17/24 1015  HGB 14.7  HCT 43.4  PLT 249  CREATININE 1.01*  TROPONINIHS 124*    Estimated Creatinine Clearance: 43.9 mL/min (A) (by C-G formula based on SCr of 1.01 mg/dL (H)).   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 CTA shows large saddle emboli with possible RH strain. Pharmacy to dose IV heparin.  Baseline INR, aPTT: not done Prior anticoagulation: none  Significant events:  Today, 03/17/2024: CBC: WNL SCr WNL No bleeding or infusion issues per nursing  Goal of Therapy: Heparin level 0.3-0.7 units/ml Monitor platelets by anticoagulation protocol: Yes  Plan: Heparin 5000 units IV bolus x 1 Heparin 1200 units/hr IV infusion Check heparin level 8 hrs after start Daily CBC, daily heparin level once stable Monitor for signs of bleeding or thrombosis  Tera Fellows, PharmD, BCPS [phone] 03/17/2024, 1:00 PM

## 2024-03-17 NOTE — ED Triage Notes (Signed)
 EMS reports from home, woke up with increasing SOB and exertion. Hx of PE and DVT, Pt recently stopped Eliquis.   BP 160/100 HR 97 RR 20 Sp02 96 @ 2 ltrs (91 RA on arrival)

## 2024-03-18 ENCOUNTER — Other Ambulatory Visit: Payer: Self-pay | Admitting: Radiology

## 2024-03-18 DIAGNOSIS — I2692 Saddle embolus of pulmonary artery without acute cor pulmonale: Secondary | ICD-10-CM | POA: Diagnosis not present

## 2024-03-18 DIAGNOSIS — I2602 Saddle embolus of pulmonary artery with acute cor pulmonale: Secondary | ICD-10-CM

## 2024-03-18 LAB — CBC
HCT: 37.9 % (ref 36.0–46.0)
Hemoglobin: 12.7 g/dL (ref 12.0–15.0)
MCH: 30.9 pg (ref 26.0–34.0)
MCHC: 33.5 g/dL (ref 30.0–36.0)
MCV: 92.2 fL (ref 80.0–100.0)
Platelets: 223 10*3/uL (ref 150–400)
RBC: 4.11 MIL/uL (ref 3.87–5.11)
RDW: 13.6 % (ref 11.5–15.5)
WBC: 9.7 10*3/uL (ref 4.0–10.5)
nRBC: 0 % (ref 0.0–0.2)

## 2024-03-18 LAB — BASIC METABOLIC PANEL WITH GFR
Anion gap: 11 (ref 5–15)
BUN: 12 mg/dL (ref 8–23)
CO2: 20 mmol/L — ABNORMAL LOW (ref 22–32)
Calcium: 8.9 mg/dL (ref 8.9–10.3)
Chloride: 110 mmol/L (ref 98–111)
Creatinine, Ser: 1.02 mg/dL — ABNORMAL HIGH (ref 0.44–1.00)
GFR, Estimated: 57 mL/min — ABNORMAL LOW (ref >60)
Glucose, Bld: 151 mg/dL — ABNORMAL HIGH (ref 70–99)
Potassium: 3.9 mmol/L (ref 3.5–5.1)
Sodium: 141 mmol/L (ref 135–145)

## 2024-03-18 LAB — HEPARIN LEVEL (UNFRACTIONATED)
Heparin Unfractionated: 0.91 [IU]/mL — ABNORMAL HIGH (ref 0.30–0.70)
Heparin Unfractionated: 0.35 [IU]/mL (ref 0.30–0.70)

## 2024-03-18 LAB — MAGNESIUM: Magnesium: 1.9 mg/dL (ref 1.7–2.4)

## 2024-03-18 LAB — PHOSPHORUS: Phosphorus: 4 mg/dL (ref 2.5–4.6)

## 2024-03-18 MED ORDER — ORAL CARE MOUTH RINSE: 15.0000 mL | OROMUCOSAL | Status: DC | PRN

## 2024-03-18 MED ORDER — HEPARIN (PORCINE) 25000 UT/250ML-% IV SOLN
900.0000 [IU]/h | INTRAVENOUS | Status: DC
  Administered 2024-03-18: 900 [IU]/h via INTRAVENOUS
  Filled 2024-03-18: qty 250

## 2024-03-18 MED ORDER — LOSARTAN POTASSIUM 50 MG PO TABS
50.0000 mg | ORAL_TABLET | Freq: Every day | ORAL | Status: DC
  Administered 2024-03-18 – 2024-03-20 (×3): 50 mg via ORAL
  Filled 2024-03-18 (×3): qty 1

## 2024-03-18 MED ORDER — PROPRANOLOL HCL 40 MG PO TABS
40.0000 mg | ORAL_TABLET | Freq: Every day | ORAL | Status: DC
  Administered 2024-03-18 – 2024-03-20 (×3): 40 mg via ORAL
  Filled 2024-03-18 (×3): qty 1

## 2024-03-18 MED ORDER — HEPARIN (PORCINE) 25000 UT/250ML-% IV SOLN
850.0000 [IU]/h | INTRAVENOUS | Status: DC
  Administered 2024-03-18: 700 [IU]/h via INTRAVENOUS
  Administered 2024-03-19: 850 [IU]/h via INTRAVENOUS
  Administered 2024-03-19: 700 [IU]/h via INTRAVENOUS
  Filled 2024-03-18: qty 250

## 2024-03-18 NOTE — Progress Notes (Signed)
 PHARMACY - ANTICOAGULATION CONSULT NOTE  Pharmacy Consult for heparin Indication: pulmonary embolus  Labs: Recent Labs    03/17/24 1015 03/17/24 1307 03/17/24 2246  HGB 14.7  --   --   HCT 43.4  --   --   PLT 249  --   --   HEPARINUNFRC  --   --  >1.10*  CREATININE 1.01*  --   --   TROPONINIHS 124* 97*  --    Assessment: 77yo female supratherapeutic on heparin with initial dosing for PE; no infusion issues or signs of bleeding per RN.  Goal of Therapy:  Heparin level 0.3-0.7 units/ml   Plan:  Hold heparin infusion for an hour then decrease rate by 4 units/kg/hr to 900 units/hr. Check level in 8 hours.   Lonnie Roberts, PharmD, BCPS 03/18/2024 12:05 AM

## 2024-03-18 NOTE — Progress Notes (Signed)
 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  At that time she was treated with mechanical thrombectomy due to concern for retinal hemorrhage systemic thrombolysis was not pursued.  She had a good outcome was discharged on room air and transition to Eliquis.  She followed up in the hematology clinic with Dr. Lambert Pillion where her hypercoagulable workup was negative.  They made a shared decision to stop anticoagulation at that time.  She presents with acute onset of chest pain and difficulty breathing this morning while she bent over to pick something up from the floor when she woke up.  She said it felt exactly like her pulmonary embolism symptoms.  She denies any recent periods of prolonged immobility.  She has a sedentary job.  She notes a couple different respiratory infections over the last few months but otherwise no significant morbidity.  She denies any complications for bleeding with Eliquis.  She has never had any intracranial hemorrhage or stroke.  She has never had cancer.  She says she is up-to-date on her cancer screening.  She has no family history of pulmonary embolism or venous thromboembolism.  Pulmonary is being consulted to assist with management of acute pulmonary embolism, unprovoked, recurrent with evidence of right heart strain and saddle distribution.  Pertinent  Medical History  Acute submassive pulmonary embolism in 2024  Significant Hospital Events: Including procedures, antibiotic start and stop dates in addition to other pertinent events   4/12 S/p thrombectomy  Interim History / Subjective:  Denies shortness of breath, chest pain, groin  pain.   Objective   Blood pressure (!) 140/84, pulse 71, temperature (!) 97.3 F (36.3 C), temperature source Axillary, resp. rate 19, height 5\' 2"  (1.575 m), weight 70.5 kg, SpO2 96%. PAP: (51-75)/(22-30) 51/22      Intake/Output Summary (Last 24 hours) at 03/18/2024 1042 Last data filed at 03/18/2024 0600 Gross per 24 hour  Intake 172.24 ml  Output 200 ml  Net -27.76 ml   Filed Weights   03/17/24 1243 03/17/24 1721 03/18/24 0303  Weight: 71.7 kg 70.5 kg 70.5 kg   Physical Exam: General: Well-appearing, no acute distress HENT: [place], AT Eyes: EOMI, no scleral icterus Respiratory: Clear to auscultation bilaterally.  No crackles, wheezing or rales Cardiovascular: RRR, -M/R/G, no JVD Extremities:-Edema,-tenderness Neuro: AAO x4, CNII-XII grossly intact Psych: Normal mood, normal affect  Hg stable  Resolved Hospital Problem list     Assessment & Plan:    Acute pulmonary embolism, submassive, saddle distribution, recurrent S/p mechanical thrombectomy 4/12 - Troponin and BNP are elevated - PESI score intermediate - IR following. Will arrange outpatient follow-up - Continue heparin gtt. Will transition to DOAC in the next 24-48 hours  Stable to transfer out of ICU  Best Practice (right click and "Reselect all SmartList Selections" daily)   Diet/type: Regular consistency (see orders) DVT prophylaxis systemic heparin Pressure ulcer(s): pressure ulcer assessment deferred  GI prophylaxis: N/A Lines: N/A Foley:  N/A Code Status:  full code Last date of multidisciplinary goals of care discussion []    Critical care time:    Care Time: 50 min  Genetta Kenning, M.D. [facility] Pulmonary/Critical Care Medicine 03/18/2024 10:42 AM  See Amion for personal pager For hours between 7 PM to 7 AM, please call Elink for urgent questions

## 2024-03-18 NOTE — Progress Notes (Signed)
 PHARMACY - ANTICOAGULATION CONSULT NOTE  Pharmacy Consult for IV heparin Indication: new PE  Allergies  Allergen Reactions   Pholcodine Other (See Comments)    This is an opioid cough suppressant- patient is unfamiliar with this entry   Sulfa Antibiotics Itching    Patient Measurements: Height: 5\' 2"  (157.5 cm) Weight: 70.5 kg (155 lb 6.8 oz) IBW/kg (Calculated) : 50.1 HEPARIN DW (KG): 65.3  Vital Signs: Temp: 97.6 F (36.4 C) (04/13 1944) Temp Source: Oral (04/13 1944) BP: 126/66 (04/13 1944) Pulse Rate: 66 (04/13 1944)  Labs: Recent Labs    03/17/24 1015 03/17/24 1307 03/17/24 2246 03/18/24 0223 03/18/24 1115 03/18/24 2148  HGB 14.7  --   --  12.7  --   --   HCT 43.4  --   --  37.9  --   --   PLT 249  --   --  223  --   --   HEPARINUNFRC  --   --  >1.10*  --  0.91* 0.35  CREATININE 1.01*  --   --  1.02*  --   --   TROPONINIHS 124* 97*  --   --   --   --     Estimated Creatinine Clearance: 43.2 mL/min (A) (by C-G formula based on SCr of 1.02 mg/dL (H)).   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 CTA shows large saddle emboli with possible RH strain. Pharmacy to dose IV heparin.  -heparin level = 0.35 on 700 units/hr  Goal of Therapy: Heparin level 0.3-0.7 units/ml Monitor platelets by anticoagulation protocol: Yes  Plan: -Continue heparin at 700 units/hr -Daily heparin level and CBC  Baxter Limber, PharmD Clinical Pharmacist **Pharmacist phone directory can now be found on [email or web address] ([email or web address]).  Listed under [facility].

## 2024-03-18 NOTE — Progress Notes (Signed)
 PHARMACY - ANTICOAGULATION CONSULT NOTE  Pharmacy Consult for IV heparin Indication: new PE  Allergies  Allergen Reactions   Pholcodine Other (See Comments)    This is an opioid cough suppressant- patient is unfamiliar with this entry   Sulfa Antibiotics Itching    Patient Measurements: Height: 5\' 2"  (157.5 cm) Weight: 70.5 kg (155 lb 6.8 oz) IBW/kg (Calculated) : 50.1 HEPARIN DW (KG): 65.3  Vital Signs: Temp: 98.1 F (36.7 C) (04/13 1131) Temp Source: Oral (04/13 1131) BP: 148/84 (04/13 1104) Pulse Rate: 87 (04/13 1104)  Labs: Recent Labs    03/17/24 1015 03/17/24 1307 03/17/24 2246 03/18/24 0223 03/18/24 1115  HGB 14.7  --   --  12.7  --   HCT 43.4  --   --  37.9  --   PLT 249  --   --  223  --   HEPARINUNFRC  --   --  >1.10*  --  0.91*  CREATININE 1.01*  --   --  1.02*  --   TROPONINIHS 124* 97*  --   --   --     Estimated Creatinine Clearance: 43.2 mL/min (A) (by C-G formula based on SCr of 1.02 mg/dL (H)).   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 CTA shows large saddle emboli with possible RH strain. Pharmacy to dose IV heparin.  HL 0.91 - supratherapeutic  CBC stable this AM   Goal of Therapy: Heparin level 0.3-0.7 units/ml Monitor platelets by anticoagulation protocol: Yes  Plan: Stop heparin infusion x1 hour  Reduce rate to 700 units/hr Check heparin level 8 hrs  Daily CBC, daily heparin level once stable Monitor for signs of bleeding or thrombosis  Oralee Billow, PharmD, BCCCP Clinical Pharmacist 03/18/2024 12:01 PM

## 2024-03-18 NOTE — Progress Notes (Signed)
 Chief Complaint: Patient was seen today for PE thrombectomy  Supervising Physician: Erica Hau  Patient Status: [facility] - In-pt  Subjective: S/p PE thrombectomy yesterday. Pt did very well with procedure. Feels much better this am. No SOB, groin pain. Hasn't been OOB yet but tolerating diet.  Objective: Physical Exam: BP (!) 140/84   Pulse 71   Temp (!) 97.3 F (36.3 C) (Axillary)   Resp 19   Ht 5\' 2"  (1.575 m)   Wt 155 lb 6.8 oz (70.5 kg)   SpO2 96%   BMI 28.43 kg/m  Lungs CTA, normal work of breathing Heart RRR Ext: (R)groin soft, NT, no hematoma Pursestring suture removed. New dressing applied.   Current Facility-Administered Medications:    Chlorhexidine Gluconate Cloth 2 % PADS 6 each, 6 each, Topical, Daily, Desai, Nikita S, MD, 6 each at 03/17/24 1719   docusate sodium (COLACE) capsule 100 mg, 100 mg, Oral, BID PRN, Desai, Nikita S, MD   heparin ADULT infusion 100 units/mL (25000 units/250mL), 900 Units/hr, Intravenous, Continuous, Lynna Sarks, RPH, Last Rate: 9 mL/hr at 03/18/24 0625, 900 Units/hr at 03/18/24 1610   Oral care mouth rinse, 15 mL, Mouth Rinse, PRN, Desai, Nikita S, MD   polyethylene glycol (MIRALAX / GLYCOLAX) packet 17 g, 17 g, Oral, Daily PRN, Aleck Hurdle, MD  Labs: CBC Recent Labs    03/17/24 1015 03/18/24 0223  WBC 7.9 9.7  HGB 14.7 12.7  HCT 43.4 37.9  PLT 249 223   BMET Recent Labs    03/17/24 1015 03/18/24 0223  NA 141 141  K 4.0 3.9  CL 109 110  CO2 24 20*  GLUCOSE 132* 151*  BUN 13 12  CREATININE 1.01* 1.02*  CALCIUM 9.5 8.9   LFT Recent Labs    03/17/24 1015  PROT 7.3  ALBUMIN 3.7  AST 19  ALT 19  ALKPHOS 69  BILITOT 0.8   PT/INR No results for input(s): "LABPROT", "INR" in the last 72 hours.   Studies/Results: CT Angio Chest PE W and/or Wo Contrast Result Date: 03/17/2024 CLINICAL DATA:  Chest pain. Shortness of breath. History of prior PE EXAM: CT ANGIOGRAPHY CHEST WITH CONTRAST  TECHNIQUE: Multidetector CT imaging of the chest was performed using the standard protocol during bolus administration of intravenous contrast. Multiplanar CT image reconstructions and MIPs were obtained to evaluate the vascular anatomy. RADIATION DOSE REDUCTION: This exam was performed according to the departmental dose-optimization program which includes automated exposure control, adjustment of the mA and/or kV according to patient size and/or use of iterative reconstruction technique. CONTRAST:  75mL OMNIPAQUE IOHEXOL 350 MG/ML SOLN COMPARISON:  CTA 05/31/2023 FINDINGS: Cardiovascular: Scattered vascular calcifications identified along the thoracic aorta and coronary arteries. Normal course and caliber to the thoracic aorta. Heart is nonenlarged. There is some left ventricular wall hypertrophy. Please correlate for any history of hypertension. Large pulmonary emboli identified including a saddle embolus across the bifurcation of the main pulmonary artery. Significant emboli along the lobar and segmental vessels more in the lower lobes and right upper lobe in particular. There is also some reflux of contrast into the intrahepatic veins and IVC. Slight flattening of the intraventricular septum. Please correlate for any clinical evidence of right heart strain. Mediastinum/Nodes: Preserved thyroid gland. Slightly patulous thoracic esophagus small hiatal hernia. No specific abnormal lymph node enlargement identified in the axillary regions, hilum or mediastinum. Exception is a small subcarinal node identified measuring 9 mm in short axis on series 6, image 52. Not  simply changed when adjusted for technique from the prior. Lungs/Pleura: Breathing motion identified. No consolidation, pneumothorax or effusion. Mild scattered ground-glass, mosaic appearance of the lungs. Upper Abdomen: Adrenal glands are preserved in the upper abdomen. Musculoskeletal: Scattered degenerative changes along the spine with disc height loss  and osteophytes. Review of the MIP images confirms the above findings. Critical Value/emergent results were called by telephone at the time of interpretation on 03/17/2024 at 12:14 pm to provider [facility] , who verbally acknowledged these results. IMPRESSION: Large, extensive pulmonary emboli including central saddle emboli. There are some changes suggestive of potential right heart strain. Please correlate with clinical presentation. Small hiatal hernia. Aortic Atherosclerosis (ICD10-I70.0). Electronically Signed   By: Adrianna Horde M.D.   On: 03/17/2024 12:15    Assessment/Plan: S/p PE thrombectomy Pt doing well clinically. Will coordinate follow up with pt to see in ~ 4weeks. Contact IR if needed.    LOS: 1 day   I spent a total of 20 minutes in face to face in clinical consultation, greater than 50% of which was counseling/coordinating care for PE thrombectomy.  Prudence Brown PA-C 03/18/2024 10:23 AM

## 2024-03-18 NOTE — Plan of Care (Signed)

## 2024-03-19 ENCOUNTER — Telehealth (HOSPITAL_COMMUNITY): Payer: Self-pay | Admitting: Pharmacy Technician

## 2024-03-19 ENCOUNTER — Other Ambulatory Visit (HOSPITAL_COMMUNITY): Payer: Self-pay

## 2024-03-19 DIAGNOSIS — I2602 Saddle embolus of pulmonary artery with acute cor pulmonale: Secondary | ICD-10-CM

## 2024-03-19 LAB — CBC
HCT: 36.2 % (ref 36.0–46.0)
Hemoglobin: 11.8 g/dL — ABNORMAL LOW (ref 12.0–15.0)
MCH: 30.9 pg (ref 26.0–34.0)
MCHC: 32.6 g/dL (ref 30.0–36.0)
MCV: 94.8 fL (ref 80.0–100.0)
Platelets: 189 10*3/uL (ref 150–400)
RBC: 3.82 MIL/uL — ABNORMAL LOW (ref 3.87–5.11)
RDW: 13.9 % (ref 11.5–15.5)
WBC: 7.2 10*3/uL (ref 4.0–10.5)
nRBC: 0 % (ref 0.0–0.2)

## 2024-03-19 LAB — HEPARIN LEVEL (UNFRACTIONATED)
Heparin Unfractionated: <0.1 [IU]/mL — ABNORMAL LOW (ref 0.30–0.70)
Heparin Unfractionated: <0.1 [IU]/mL — ABNORMAL LOW (ref 0.30–0.70)
Heparin Unfractionated: 0.4 [IU]/mL (ref 0.30–0.70)

## 2024-03-19 MED ORDER — HEPARIN BOLUS VIA INFUSION
2100.0000 [IU] | Freq: Once | INTRAVENOUS | Status: AC
Start: 2024-03-19 — End: 2024-03-19
  Administered 2024-03-19: 2100 [IU] via INTRAVENOUS
  Filled 2024-03-19: qty 2100

## 2024-03-19 MED ORDER — APIXABAN 5 MG PO TABS: 5.0000 mg | ORAL_TABLET | Freq: Two times a day (BID) | ORAL | Status: DC

## 2024-03-19 MED ORDER — APIXABAN 5 MG PO TABS
10.0000 mg | ORAL_TABLET | Freq: Two times a day (BID) | ORAL | Status: DC
Start: 2024-03-20 — End: 2024-03-27
  Administered 2024-03-20: 10 mg via ORAL
  Filled 2024-03-19: qty 2

## 2024-03-19 NOTE — Progress Notes (Addendum)
 PHARMACY - ANTICOAGULATION CONSULT NOTE  Pharmacy Consult for IV heparin Indication: new PE  Allergies  Allergen Reactions   Pholcodine Other (See Comments)    This is an opioid cough suppressant- patient is unfamiliar with this entry   Sulfa Antibiotics Itching    Patient Measurements: Height: 5\' 2"  (157.5 cm) Weight: 70.5 kg (155 lb 6.8 oz) IBW/kg (Calculated) : 50.1 HEPARIN DW (KG): 65.3  Vital Signs: Temp: 97.3 F (36.3 C) (04/14 0744) Temp Source: Oral (04/14 0744) BP: 148/70 (04/14 0744) Pulse Rate: 60 (04/14 0744)  Labs: Recent Labs    03/17/24 1015 03/17/24 1307 03/17/24 2246 03/18/24 0223 03/18/24 1115 03/18/24 2148 03/19/24 0645  HGB 14.7  --   --  12.7  --   --   --   HCT 43.4  --   --  37.9  --   --   --   PLT 249  --   --  223  --   --   --   HEPARINUNFRC  --   --    < >  --  0.91* 0.35 <0.10*  CREATININE 1.01*  --   --  1.02*  --   --   --   TROPONINIHS 124* 97*  --   --   --   --   --    < > = values in this interval not displayed.    Estimated Creatinine Clearance: 43.2 mL/min (A) (by C-G formula based on SCr of 1.02 mg/dL (H)).   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 CTA shows large saddle emboli with possible RH strain. Pharmacy to dose IV heparin.  Today, nursing noted that heparin pump was turned off for an unknown amount of time overnight and re-started this morning.  Heparin level this morning is subtherapeutic at < 0.10 due to issues with the infusion overnight. CBC wnl, no bleeding noted per RN.  Goal of Therapy: Heparin level 0.3-0.7 units/ml Monitor platelets by anticoagulation protocol:  Yes  Plan: - Continue heparin at 700 units/hr - Collect 8h confirmatory level  - Daily heparin level and CBC  Homer Lust, PharmD 03/19/2024 7:57 AM   ---------------------------------------------------------------------------------------------------------------------------- Addendum Follow-up heparin level collected and level remains subtherapeutic at < 0.10 on 700units/hr. Patient was previously supratherapeutic on 900 units/hr   Plan: - Bolus 2100 units - increase heparin gtt to 850 units/hr - Collect 8h confirmatory level  - Daily heparin level and CBC  Homer Lust, PharmD 03/19/2024 2:20 PM

## 2024-03-19 NOTE — Telephone Encounter (Signed)
 Pharmacy Patient Advocate Encounter  Insurance verification completed.    The patient is insured through [facility]. Patient has Medicare and is not eligible for a copay card, but may be able to apply for patient assistance or Medicare RX Payment Plan (Patient Must reach out to their plan, if eligible for payment plan), if available.    Ran test claim for ELIQUIS 5MG  TABLETS and the current 30 day co-pay is $554.51.   This test claim was processed through [facility]- copay amounts may vary at other pharmacies due to pharmacy/plan contracts, or as the patient moves through the different stages of their insurance plan.

## 2024-03-19 NOTE — Progress Notes (Signed)
 Transition of Care [facility]) - Inpatient Brief Assessment   Patient Details  Name: Leah Knight MRN: [id number] Date of Birth: 12-04-1946  [facility] of [facility]) CM/SW Contact:    Jannine Meo, RN Phone Number: 03/19/2024, 2:16 PM   Clinical Narrative: Patient from home with c/o chest pain and shortness of breath. Patient has history of PE in past, found to have PE this admission. Patient on IV Heparin. No TOC needs at this time. TOC will continue for follow for any discharge needs.   Transition of Care Asessment: Insurance and Status: (P) Insurance coverage has been reviewed Patient has primary care physician: (P) Yes Home environment has been reviewed: (P) Home Prior level of function:: (P) Independent Prior/Current Home Services: (P) No current home services Social Drivers of Health Review: (P) SDOH reviewed no interventions necessary Readmission risk has been reviewed: (P) Yes Transition of care needs: (P) no transition of care needs at this time

## 2024-03-19 NOTE — Progress Notes (Signed)
 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  Patient was taken to the ICU and underwent mechanical thrombectomy on 4/12 with good effect and transferred out 4/13.  Subjective:  Patient states that she feels surprisingly well.  Noted that last time it took for a week to start feeling better but today she feels back to normal with no shortness of breath.   Objective: Vitals:   03/18/24 1821 03/18/24 1944 03/19/24 0744 03/19/24 1549  BP: 118/79 126/66 (!) 148/70 (!) 144/73  Pulse: 70 66 60 67  Resp: 18 17 18 18   Temp: 98.5 F (36.9 C) 97.6 F (36.4 C) (!) 97.3 F (36.3 C)   TempSrc: Oral Oral Oral   SpO2: 98% 97% 97% 97%  Weight:      Height:       No intake or output data in the 24 hours ending 03/19/24 1553 Filed Weights   03/17/24 1243 03/17/24 1721 03/18/24 0303  Weight: 71.7 kg 70.5 kg 70.5 kg     Exam:  General: Relatively well-appearing female sitting up in bed in NAD Eyes: sclera anicteric, conjuctiva mild injection bilaterally CVS: S1-S2, regular  Respiratory:  decreased air entry bilaterally secondary to decreased inspiratory effort, rales at bases  GI: NABS, soft, NT  LE: Warm and well-perfused Neuro: A/O x 3,  grossly nonfocal.  Psych: patient is logical and coherent, judgement and insight appear normal, mood and affect appropriate to situation.  Data Reviewed:  Basic Metabolic Panel: Recent Labs  Lab 03/17/24 1015 03/18/24 0223  NA 141 141  K 4.0 3.9  CL 109 110  CO2 24 20*   GLUCOSE 132* 151*  BUN 13 12  CREATININE 1.01* 1.02*  CALCIUM 9.5 8.9  MG  --  1.9  PHOS  --  4.0    CBC: Recent Labs  Lab 03/17/24 1015 03/18/24 0223 03/19/24 0645  WBC 7.9 9.7 7.2  NEUTROABS 6.0  --   --   HGB 14.7 12.7 11.8*  HCT 43.4 37.9 36.2  MCV 94.6 92.2 94.8  PLT 249 223 189     Scheduled Meds:  Chlorhexidine Gluconate Cloth  6 each Topical Daily   losartan  50 mg Oral Daily   propranolol  40 mg Oral Daily   Continuous Infusions:  heparin 850 Units/hr (03/19/24 1502)     Assessment & Plan:   Recurrent acute saddle PE, submassive with right heart strain S/p mechanical thrombectomy 4/12 States she feels well, feels like she is back to baseline Still on heparin drip, plan is to transition to DOAC tomorrow She had tolerated Eliquis well in the past with no bleeding And follow-up with pulmonologist after discharge  HTN Continue propranolol and losartan  GERD Restart PPI   DVT prophylaxis: On IV heparin Code Status: Full Family Communication: None today     Studies: IR THROMBECT PRIM MECH INIT (INCLU) MOD SED Result Date: 03/18/2024 INDICATION: Submassive saddle and bilateral pulmonary embolism with shortness of breath, right heart strain, elevated troponin level and oxygen requirement. Status post prior bilateral pulmonary thrombectomy on 05/31/2023 with recurrent PE  after cessation of anticoagulation in December. EXAM: 1. ULTRASOUND GUIDANCE FOR VASCULAR ACCESS OF THE RIGHT COMMON FEMORAL VEIN 2. BILATERAL PULMONARY ARTERIOGRAPHY INCLUDING SELECTIVE ARTERIOGRAPHY THE RIGHT INTER LOBAR PULMONARY ARTERY AND LEFT LOWER LOBE PULMONARY ARTERY 3. PERCUTANEOUS INTRALUMINAL MECHANICAL THROMBECTOMY OF RIGHT PULMONARY ARTERY 4. ADDITIONAL SECOND PERCUTANEOUS INTRALUMINAL MECHANICAL THROMBECTOMY OF LEFT PULMONARY ARTERY COMPARISON:  CTA of the chest on the same day. MEDICATIONS: None. ANESTHESIA/SEDATION: Moderate (conscious) sedation was employed during this  procedure. A total of Versed 2.0 mg and Fentanyl 100 mcg was administered intravenously by the radiology nurse. Total intra-service moderate Sedation Time: 84 minutes. The patient's level of consciousness and vital signs were monitored continuously by radiology nursing throughout the procedure under my direct supervision. FLUOROSCOPY: Radiation Exposure Index (as provided by the fluoroscopic device): 63 mGy Kerma COMPLICATIONS: None immediate. TECHNIQUE: Informed written consent was obtained from the patient after a thorough discussion of the procedural risks, benefits and alternatives. All questions were addressed. Maximal Sterile Barrier Technique was utilized including caps, mask, sterile gowns, sterile gloves, sterile drape, hand hygiene and skin antiseptic. A timeout was performed prior to the initiation of the procedure. Both groins were prepped with chlorhexidine and local anesthesia was provided with 1% lidocaine. Patency of the right common femoral vein and saphenofemoral junction were confirmed by ultrasound with an ultrasound image recorded. Access of the right common femoral vein was performed near the saphenofemoral junction under direct ultrasound guidance with a micropuncture set. A 6 French sheath was initially placed over a guidewire. A 6 French angled pigtail catheter was advanced over a guidewire into the inferior vena cava and further into the right atrium. The catheter was then further advanced through the right ventricle and into the pulmonary outflow tract. Pulmonary artery pressures were measured and documented in the distal main pulmonary artery at the bifurcation into right and left pulmonary arteries. Pulmonary arteriography was then initially performed at the level of the distal main pulmonary artery. The angled pigtail catheter was then removed over a Rosen wire which was advanced into the right pulmonary artery. Additional sub-selective arteriography was performed through a 5 [place]  Vert catheter at the level of the right inter lobar pulmonary artery. The catheter was then removed over a guidewire. The 6 French sheath was removed and a 24 [place] Inari introducer sheath was advanced into the upper IVC. The Rosen wire was then exchanged over a catheter for a stiff Amplatz wire. An Mendel Stain [phone] aspiration catheter was then advanced over a guidewire into the right lower lobe pulmonary artery under fluoroscopic guidance. Multiple mechanical thrombectomy aspiration pulls were performed at this level and the aspiration catheter also gently retracted over the guidewire. During the procedure, aspirated blood was filtered through the Flow Saver blood return system and the filtered blood re-infused back into the patient via the introducer sheath. Mechanical thrombectomy was also performed at the level of the interlobar and main right pulmonary artery. Completion arteriography of the right pulmonary artery was performed through the 24 French aspiration catheter. The aspiration catheter was then directed into the left pulmonary artery after guidewire redirection into the left lower lobe pulmonary artery. Multiple mechanical thrombectomy aspiration pulls were performed at this level and the aspiration catheter also gently retracted over the guidewire. Completion arteriography was performed at the level the left lower lobe pulmonary artery through the 24 French aspiration catheter. The catheter was then retracted into the main pulmonary outflow tract and postprocedure pulmonary artery pressure measurements obtained and documented. A Prolene pursestring suture  was applied at the femoral sheath exit site and the sheath removed. Manual compression was applied at the sheath exit site. FINDINGS: Prior to thrombectomy, initial measured pulmonary artery pressures were 75/30, mean 48 mm Hg. Arteriography at the level of the main pulmonary artery demonstrates nonocclusive thrombus in the left lower lobe pulmonary  artery and very poor perfusion of the right lung due to thrombus. Component of saddle embolism seen by CTA is difficult to discretely see by arteriography but is suspected across the bifurcation into left and right pulmonary arteries. Additional selective catheter arteriography at the level of the interlobar right pulmonary artery demonstrates nonocclusive thrombus extending into right lower lobe and middle lobe branches with atelectasis of the right middle lobe and lower lobe and atretic appearance of right middle lobe and lower lobe pulmonary arteries. Initial intraluminal mechanical thrombectomy at the level of the right lower lobe pulmonary artery, interlobar pulmonary artery and right pulmonary artery yielded moderate thrombus. 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 This was also confirmed on completion arteriography with arteriography demonstrating no significant residual thrombus on the right but clear preferential flow in the right upper lobe with the right middle lobe and lower lobe demonstrating attenuated flow and attenuated appearance of vessels likely on the basis of chronic thrombus. The more central right pulmonary artery pass yielded would appear to be likely consistent with the saddle component of thrombus. Left lower lobe intraluminal mechanical thrombectomy yielded moderate thrombus including a significant intact acute appearing thrombus. Completion arteriography demonstrates no significant residual thrombus remaining with some small peripheral perfusion defects in the left upper lobe and left lower lobe remaining. Completion pulmonary artery pressure showed significant reduction to 51/22, mean 34 mm Hg. IMPRESSION: Successful bilateral pulmonary arterial intraluminal mechanical thrombectomy as documented above with yield of moderate bilateral thrombus. There clearly was a component of chronic thrombus in the right lower lobe and right middle lobe pulmonary  arteries which demonstrate decreased and attenuated flow on postprocedural angiography despite not containing significant residual thrombus with preferential right-sided flow in the right upper lobe. It was felt that the majority of acute thrombus was able to be removed including the saddle component. No significant residual left-sided thrombus was identified on postprocedural arteriography except for a few small peripheral defects. Pulmonary artery pressure showed improvement after thrombectomy with reduction from preprocedural measurement of 75/30, a mean 48 mm Hg to 51/22 mean 34 mm mercury on completion. Electronically Signed   By: Irish Lack M.D.   On: 03/18/2024 10:42   IR Angiogram Pulmonary Bilateral Selective Result Date: 03/18/2024 INDICATION: Submassive saddle and bilateral pulmonary embolism with shortness of breath, right heart strain, elevated troponin level and oxygen requirement. 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 EXAM: 1. ULTRASOUND GUIDANCE FOR VASCULAR ACCESS OF THE RIGHT COMMON FEMORAL VEIN 2. BILATERAL PULMONARY ARTERIOGRAPHY INCLUDING SELECTIVE ARTERIOGRAPHY THE RIGHT INTER LOBAR PULMONARY ARTERY AND LEFT LOWER LOBE PULMONARY ARTERY 3. PERCUTANEOUS INTRALUMINAL MECHANICAL THROMBECTOMY OF RIGHT PULMONARY ARTERY 4. ADDITIONAL SECOND PERCUTANEOUS INTRALUMINAL MECHANICAL THROMBECTOMY OF LEFT PULMONARY ARTERY COMPARISON:  CTA of the chest on the same day. MEDICATIONS: None. ANESTHESIA/SEDATION: Moderate (conscious) sedation was employed during this procedure. A total of Versed 2.0 mg and Fentanyl 100 mcg was administered intravenously by the radiology nurse. Total intra-service moderate Sedation Time: 84 minutes. The patient's level of consciousness and vital signs were monitored continuously by radiology nursing throughout the procedure under my direct  supervision. FLUOROSCOPY: Radiation Exposure  Index (as provided by the fluoroscopic device): 63 mGy Kerma COMPLICATIONS: None immediate. TECHNIQUE: Informed written consent was obtained from the patient after a thorough discussion of the procedural risks, benefits and alternatives. All questions were addressed. Maximal Sterile Barrier Technique was utilized including caps, mask, sterile gowns, sterile gloves, sterile drape, hand hygiene and skin antiseptic. A timeout was performed prior to the initiation of the procedure. Both groins were prepped with chlorhexidine and local anesthesia was provided with 1% lidocaine. Patency of the right common femoral vein and saphenofemoral junction were confirmed by ultrasound with an ultrasound image recorded. Access of the right common femoral vein was performed near the saphenofemoral junction under direct ultrasound guidance with a micropuncture set. A 6 French sheath was initially placed over a guidewire. A 6 French angled pigtail catheter was advanced over a guidewire into the inferior vena cava and further into the right atrium. The catheter was then further advanced through the right ventricle and into the pulmonary outflow tract. Pulmonary artery pressures were measured and documented in the distal main pulmonary artery at the bifurcation into right and left pulmonary arteries. Pulmonary arteriography was then initially performed at the level of the distal main pulmonary artery. The angled pigtail catheter was then removed over a Rosen wire which was advanced into the right pulmonary artery. Additional sub-selective arteriography was performed through a 5 [place] Vert catheter at the level of the right inter lobar pulmonary artery. The catheter was then removed over a guidewire. The 6 French sheath was removed and a 24 [place] Inari introducer sheath was advanced into the upper IVC. The Rosen wire was then exchanged over a catheter for a stiff Amplatz wire. An Mendel Stain [phone] aspiration catheter was then advanced  over a guidewire into the right lower lobe pulmonary artery under fluoroscopic guidance. Multiple mechanical thrombectomy aspiration pulls were performed at this level and the aspiration catheter also gently retracted over the guidewire. During the procedure, aspirated blood was filtered through the Flow Saver blood return system and the filtered blood re-infused back into the patient via the introducer sheath. Mechanical thrombectomy was also performed at the level of the interlobar and main right pulmonary artery. Completion arteriography of the right pulmonary artery was performed through the 24 French aspiration catheter. The aspiration catheter was then directed into the left pulmonary artery after guidewire redirection into the left lower lobe pulmonary artery. Multiple mechanical thrombectomy aspiration pulls were performed at this level and the aspiration catheter also gently retracted over the guidewire. Completion arteriography was performed at the level the left lower lobe pulmonary artery through the 24 French aspiration catheter. The catheter was then retracted into the main pulmonary outflow tract and postprocedure pulmonary artery pressure measurements obtained and documented. A Prolene pursestring suture was applied at the femoral sheath exit site and the sheath removed. Manual compression was applied at the sheath exit site. FINDINGS: Prior to thrombectomy, initial measured pulmonary artery pressures were 75/30, mean 48 mm Hg. Arteriography at the level of the main pulmonary artery demonstrates nonocclusive thrombus in the left lower lobe pulmonary artery and very poor perfusion of the right lung due to thrombus. Component of saddle embolism seen by CTA is difficult to discretely see by arteriography but is suspected across the bifurcation into left and right pulmonary arteries. Additional selective catheter arteriography at the level of the interlobar right pulmonary artery demonstrates  nonocclusive thrombus extending into right lower lobe and middle lobe branches with atelectasis of the right  middle lobe and lower lobe and atretic appearance of right middle lobe and lower lobe pulmonary arteries. Initial intraluminal mechanical thrombectomy at the level of the right lower lobe pulmonary artery, interlobar pulmonary artery and right pulmonary artery yielded moderate thrombus. 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 This was also confirmed on completion arteriography with arteriography demonstrating no significant residual thrombus on the right but clear preferential flow in the right upper lobe with the right middle lobe and lower lobe demonstrating attenuated flow and attenuated appearance of vessels likely on the basis of chronic thrombus. The more central right pulmonary artery pass yielded would appear to be likely consistent with the saddle component of thrombus. Left lower lobe intraluminal mechanical thrombectomy yielded moderate thrombus including a significant intact acute appearing thrombus. Completion arteriography demonstrates no significant residual thrombus remaining with some small peripheral perfusion defects in the left upper lobe and left lower lobe remaining. Completion pulmonary artery pressure showed significant reduction to 51/22, mean 34 mm Hg. IMPRESSION: Successful bilateral pulmonary arterial intraluminal mechanical thrombectomy as documented above with yield of moderate bilateral thrombus. 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 It was felt that the majority of acute thrombus was able to be removed including the saddle component. No significant residual left-sided thrombus  was identified on postprocedural arteriography except for a few small peripheral defects. Pulmonary artery pressure showed improvement after thrombectomy with reduction from preprocedural measurement of 75/30, a mean 48 mm Hg to 51/22 mean 34 mm mercury on completion. Electronically Signed   By: Irish Lack M.D.   On: 03/18/2024 10:42   IR US Guide Vasc Access Right Result Date: 03/18/2024 INDICATION: Submassive saddle and bilateral pulmonary embolism with shortness of breath, right heart strain, elevated troponin level and oxygen requirement. 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 EXAM: 1. ULTRASOUND GUIDANCE FOR VASCULAR ACCESS OF THE RIGHT COMMON FEMORAL VEIN 2. BILATERAL PULMONARY ARTERIOGRAPHY INCLUDING SELECTIVE ARTERIOGRAPHY THE RIGHT INTER LOBAR PULMONARY ARTERY AND LEFT LOWER LOBE PULMONARY ARTERY 3. PERCUTANEOUS INTRALUMINAL MECHANICAL THROMBECTOMY OF RIGHT PULMONARY ARTERY 4. ADDITIONAL SECOND PERCUTANEOUS INTRALUMINAL MECHANICAL THROMBECTOMY OF LEFT PULMONARY ARTERY COMPARISON:  CTA of the chest on the same day. MEDICATIONS: None. ANESTHESIA/SEDATION: Moderate (conscious) sedation was employed during this procedure. A total of Versed 2.0 mg and Fentanyl 100 mcg was administered intravenously by the radiology nurse. Total intra-service moderate Sedation Time: 84 minutes. The patient's level of consciousness and vital signs were monitored continuously by radiology nursing throughout the procedure under my direct supervision. FLUOROSCOPY: Radiation Exposure Index (as provided by the fluoroscopic device): 63 mGy Kerma COMPLICATIONS: None immediate. TECHNIQUE: Informed written consent was obtained from the patient after a thorough discussion of the procedural risks, benefits and alternatives. All questions were addressed. Maximal Sterile Barrier Technique was utilized including caps, mask, sterile gowns, sterile  gloves, sterile drape, hand hygiene and skin antiseptic. A timeout was performed prior to the initiation of the procedure. Both groins were prepped with chlorhexidine and local anesthesia was provided with 1% lidocaine. Patency of the right common femoral vein and saphenofemoral junction were confirmed by ultrasound with an ultrasound image recorded. Access of the right common femoral vein was performed near the saphenofemoral junction under direct ultrasound  guidance with a micropuncture set. A 6 French sheath was initially placed over a guidewire. A 6 French angled pigtail catheter was advanced over a guidewire into the inferior vena cava and further into the right atrium. The catheter was then further advanced through the right ventricle and into the pulmonary outflow tract. Pulmonary artery pressures were measured and documented in the distal main pulmonary artery at the bifurcation into right and left pulmonary arteries. Pulmonary arteriography was then initially performed at the level of the distal main pulmonary artery. The angled pigtail catheter was then removed over a Rosen wire which was advanced into the right pulmonary artery. Additional sub-selective arteriography was performed through a 5 [place] Vert catheter at the level of the right inter lobar pulmonary artery. The catheter was then removed over a guidewire. The 6 French sheath was removed and a 24 [place] Inari introducer sheath was advanced into the upper IVC. The Rosen wire was then exchanged over a catheter for a stiff Amplatz wire. An Mendel Stain [phone] aspiration catheter was then advanced over a guidewire into the right lower lobe pulmonary artery under fluoroscopic guidance. Multiple mechanical thrombectomy aspiration pulls were performed at this level and the aspiration catheter also gently retracted over the guidewire. During the procedure, aspirated blood was filtered through the Flow Saver blood return system and the filtered blood  re-infused back into the patient via the introducer sheath. Mechanical thrombectomy was also performed at the level of the interlobar and main right pulmonary artery. Completion arteriography of the right pulmonary artery was performed through the 24 French aspiration catheter. The aspiration catheter was then directed into the left pulmonary artery after guidewire redirection into the left lower lobe pulmonary artery. Multiple mechanical thrombectomy aspiration pulls were performed at this level and the aspiration catheter also gently retracted over the guidewire. Completion arteriography was performed at the level the left lower lobe pulmonary artery through the 24 French aspiration catheter. The catheter was then retracted into the main pulmonary outflow tract and postprocedure pulmonary artery pressure measurements obtained and documented. A Prolene pursestring suture was applied at the femoral sheath exit site and the sheath removed. Manual compression was applied at the sheath exit site. FINDINGS: Prior to thrombectomy, initial measured pulmonary artery pressures were 75/30, mean 48 mm Hg. Arteriography at the level of the main pulmonary artery demonstrates nonocclusive thrombus in the left lower lobe pulmonary artery and very poor perfusion of the right lung due to thrombus. Component of saddle embolism seen by CTA is difficult to discretely see by arteriography but is suspected across the bifurcation into left and right pulmonary arteries. Additional selective catheter arteriography at the level of the interlobar right pulmonary artery demonstrates nonocclusive thrombus extending into right lower lobe and middle lobe branches with atelectasis of the right middle lobe and lower lobe and atretic appearance of right middle lobe and lower lobe pulmonary arteries. Initial intraluminal mechanical thrombectomy at the level of the right lower lobe pulmonary artery, interlobar pulmonary artery and right pulmonary  artery yielded moderate thrombus. 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 This was also confirmed on completion arteriography with arteriography demonstrating no significant residual thrombus on the right but clear preferential flow in the right upper lobe with the right middle lobe and lower lobe demonstrating attenuated flow and attenuated appearance of vessels likely on the basis of chronic thrombus. The more central right pulmonary artery pass yielded would appear to be likely consistent with the saddle component of  thrombus. Left lower lobe intraluminal mechanical thrombectomy yielded moderate thrombus including a significant intact acute appearing thrombus. Completion arteriography demonstrates no significant residual thrombus remaining with some small peripheral perfusion defects in the left upper lobe and left lower lobe remaining. Completion pulmonary artery pressure showed significant reduction to 51/22, mean 34 mm Hg. IMPRESSION: Successful bilateral pulmonary arterial intraluminal mechanical thrombectomy as documented above with yield of moderate bilateral thrombus. 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 It was felt that the majority of acute thrombus was able to be removed including the saddle component. No significant residual left-sided thrombus was identified on postprocedural arteriography except for a few small peripheral defects. Pulmonary artery pressure showed improvement after thrombectomy with reduction from preprocedural measurement of 75/30, a mean 48 mm Hg to 51/22 mean 34 mm mercury on completion. Electronically Signed   By: Erica Hau M.D.   On: 03/18/2024 10:42   IR THROMBECT SEC  MECH MOD SED Result Date: 03/18/2024 INDICATION: Submassive saddle and bilateral pulmonary embolism with shortness of breath, right heart strain, elevated troponin level and oxygen requirement. 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 EXAM: 1. ULTRASOUND GUIDANCE FOR VASCULAR ACCESS OF THE RIGHT COMMON FEMORAL VEIN 2. BILATERAL PULMONARY ARTERIOGRAPHY INCLUDING SELECTIVE ARTERIOGRAPHY THE RIGHT INTER LOBAR PULMONARY ARTERY AND LEFT LOWER LOBE PULMONARY ARTERY 3. PERCUTANEOUS INTRALUMINAL MECHANICAL THROMBECTOMY OF RIGHT PULMONARY ARTERY 4. ADDITIONAL SECOND PERCUTANEOUS INTRALUMINAL MECHANICAL THROMBECTOMY OF LEFT PULMONARY ARTERY COMPARISON:  CTA of the chest on the same day. MEDICATIONS: None. ANESTHESIA/SEDATION: Moderate (conscious) sedation was employed during this procedure. A total of Versed 2.0 mg and Fentanyl 100 mcg was administered intravenously by the radiology nurse. Total intra-service moderate Sedation Time: 84 minutes. The patient's level of consciousness and vital signs were monitored continuously by radiology nursing throughout the procedure under my direct supervision. FLUOROSCOPY: Radiation Exposure Index (as provided by the fluoroscopic device): 63 mGy Kerma COMPLICATIONS: None immediate. TECHNIQUE: Informed written consent was obtained from the patient after a thorough discussion of the procedural risks, benefits and alternatives. All questions were addressed. Maximal Sterile Barrier Technique was utilized including caps, mask, sterile gowns, sterile gloves, sterile drape, hand hygiene and skin antiseptic. A timeout was performed prior to the initiation of the procedure. Both groins were prepped with chlorhexidine and local anesthesia was provided with 1% lidocaine. Patency of the right common femoral vein and saphenofemoral junction were confirmed by ultrasound with an ultrasound image recorded. Access  of the right common femoral vein was performed near the saphenofemoral junction under direct ultrasound guidance with a micropuncture set. A 6 French sheath was initially placed over a guidewire. A 6 French angled pigtail catheter was advanced over a guidewire into the inferior vena cava and further into the right atrium. The catheter was then further advanced through the right ventricle and into the pulmonary outflow tract. Pulmonary artery pressures were measured and documented in the distal main pulmonary artery at the bifurcation into right and left pulmonary arteries. Pulmonary arteriography was then initially performed at the level of the distal main pulmonary artery. The angled pigtail catheter was then removed over a Rosen wire which was advanced into the right pulmonary artery. Additional sub-selective arteriography was performed through a 5 [place] Vert catheter at the  level of the right inter lobar pulmonary artery. The catheter was then removed over a guidewire. The 6 French sheath was removed and a 24 [place] Inari introducer sheath was advanced into the upper IVC. The Rosen wire was then exchanged over a catheter for a stiff Amplatz wire. An Karie Kirks [id number] aspiration catheter was then advanced over a guidewire into the right lower lobe pulmonary artery under fluoroscopic guidance. Multiple mechanical thrombectomy aspiration pulls were performed at this level and the aspiration catheter also gently retracted over the guidewire. During the procedure, aspirated blood was filtered through the Flow Saver blood return system and the filtered blood re-infused back into the patient via the introducer sheath. Mechanical thrombectomy was also performed at the level of the interlobar and main right pulmonary artery. Completion arteriography of the right pulmonary artery was performed through the 24 French aspiration catheter. The aspiration catheter was then directed into the left pulmonary artery after guidewire  redirection into the left lower lobe pulmonary artery. Multiple mechanical thrombectomy aspiration pulls were performed at this level and the aspiration catheter also gently retracted over the guidewire. Completion arteriography was performed at the level the left lower lobe pulmonary artery through the 24 French aspiration catheter. The catheter was then retracted into the main pulmonary outflow tract and postprocedure pulmonary artery pressure measurements obtained and documented. A Prolene pursestring suture was applied at the femoral sheath exit site and the sheath removed. Manual compression was applied at the sheath exit site. FINDINGS: Prior to thrombectomy, initial measured pulmonary artery pressures were 75/30, mean 48 mm Hg. Arteriography at the level of the main pulmonary artery demonstrates nonocclusive thrombus in the left lower lobe pulmonary artery and very poor perfusion of the right lung due to thrombus. Component of saddle embolism seen by CTA is difficult to discretely see by arteriography but is suspected across the bifurcation into left and right pulmonary arteries. Additional selective catheter arteriography at the level of the interlobar right pulmonary artery demonstrates nonocclusive thrombus extending into right lower lobe and middle lobe branches with atelectasis of the right middle lobe and lower lobe and atretic appearance of right middle lobe and lower lobe pulmonary arteries. Initial intraluminal mechanical thrombectomy at the level of the right lower lobe pulmonary artery, interlobar pulmonary artery and right pulmonary artery yielded moderate thrombus. 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 This was also confirmed on completion arteriography with arteriography demonstrating no significant residual thrombus on the right but clear preferential flow in the right upper lobe with the right middle lobe and lower lobe demonstrating attenuated flow and  attenuated appearance of vessels likely on the basis of chronic thrombus. The more central right pulmonary artery pass yielded would appear to be likely consistent with the saddle component of thrombus. Left lower lobe intraluminal mechanical thrombectomy yielded moderate thrombus including a significant intact acute appearing thrombus. Completion arteriography demonstrates no significant residual thrombus remaining with some small peripheral perfusion defects in the left upper lobe and left lower lobe remaining. Completion pulmonary artery pressure showed significant reduction to 51/22, mean 34 mm Hg. IMPRESSION: Successful bilateral pulmonary arterial intraluminal mechanical thrombectomy as documented above with yield of moderate bilateral thrombus. 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 It was felt that the majority of acute  thrombus was able to be removed including the saddle component. No significant residual left-sided thrombus was identified on postprocedural arteriography except for a few small peripheral defects. Pulmonary artery pressure showed improvement after thrombectomy with reduction from preprocedural measurement of 75/30, a mean 48 mm Hg to 51/22 mean 34 mm mercury on completion. Electronically Signed   By: Erica Hau M.D.   On: 03/18/2024 10:42    Principal Problem:   Acute pulmonary embolism with acute cor pulmonale (HCC)     Jovita Nipper Rollene Clink, [facility]  If 7PM-7AM, please contact night-coverage [email or web address]   LOS: 2 days

## 2024-03-19 NOTE — Progress Notes (Signed)
 PHARMACY - ANTICOAGULATION CONSULT NOTE  Pharmacy Consult for IV heparin -> apixaban Indication: new PE  Allergies  Allergen Reactions   Pholcodine Other (See Comments)    This is an opioid cough suppressant- patient is unfamiliar with this entry   Sulfa Antibiotics Itching    Patient Measurements: Height: 5\' 2"  (157.5 cm) Weight: 70.5 kg (155 lb 6.8 oz) IBW/kg (Calculated) : 50.1 HEPARIN DW (KG): 65.3  Vital Signs: Temp: 97.3 F (36.3 C) (04/14 0744) Temp Source: Oral (04/14 0744) BP: 144/73 (04/14 1549) Pulse Rate: 67 (04/14 1549)  Labs: Recent Labs    03/17/24 1015 03/17/24 1307 03/17/24 2246 03/18/24 0223 03/18/24 1115 03/18/24 2148 03/19/24 0645 03/19/24 1334  HGB 14.7  --   --  12.7  --   --  11.8*  --   HCT 43.4  --   --  37.9  --   --  36.2  --   PLT 249  --   --  223  --   --  189  --   HEPARINUNFRC  --   --    < >  --    < > 0.35 <0.10* <0.10*  CREATININE 1.01*  --   --  1.02*  --   --   --   --   TROPONINIHS 124* 97*  --   --   --   --   --   --    < > = values in this interval not displayed.    Estimated Creatinine Clearance: 43.2 mL/min (A) (by C-G formula based on SCr of 1.02 mg/dL (H)).   Assessment: 39 yoF presents 4/12 with shortness of breath. 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 CTA shows large saddle emboli with possible RH strain. Pharmacy to dose IV heparin.  Consult to transition to po apixaban 4/15 a.m.   Goal of Therapy: Heparin level 0.3-0.7 units/ml Monitor platelets by anticoagulation protocol: Yes  Plan: Apixaban 10mg  po BID x 7 days (first dose 4/15 1000) then on 4/22 change to 5 mg po BID Continue heparin at 700 units/hr. Stop 4/15 1000 when first dose of apixaban given 8 hr heparin level  Enrigue Harvard, PharmD, BCPS Please see [email or web address] for complete clinical pharmacist phone list 03/19/2024 4:17 PM

## 2024-03-19 NOTE — Progress Notes (Signed)
 PHARMACY - ANTICOAGULATION CONSULT NOTE  Pharmacy Consult for IV heparin Indication: new PE  Allergies  Allergen Reactions   Pholcodine Other (See Comments)    This is an opioid cough suppressant- patient is unfamiliar with this entry   Sulfa Antibiotics Itching    Patient Measurements: Height: 5\' 2"  (157.5 cm) Weight: 70.5 kg (155 lb 6.8 oz) IBW/kg (Calculated) : 50.1 HEPARIN DW (KG): 65.3  Vital Signs: Temp: 98.4 F (36.9 C) (04/14 1934) Temp Source: Oral (04/14 1934) BP: 140/79 (04/14 1934) Pulse Rate: 61 (04/14 1934)  Labs: Recent Labs    03/17/24 1015 03/17/24 1307 03/17/24 2246 03/18/24 0223 03/18/24 1115 03/19/24 0645 03/19/24 1334 03/19/24 2146  HGB 14.7  --   --  12.7  --  11.8*  --   --   HCT 43.4  --   --  37.9  --  36.2  --   --   PLT 249  --   --  223  --  189  --   --   HEPARINUNFRC  --   --    < >  --    < > <0.10* <0.10* 0.40  CREATININE 1.01*  --   --  1.02*  --   --   --   --   TROPONINIHS 124* 97*  --   --   --   --   --   --    < > = values in this interval not displayed.    Estimated Creatinine Clearance: 43.2 mL/min (A) (by C-G formula based on SCr of 1.02 mg/dL (H)).   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 CTA shows large saddle emboli with possible RH strain. Pharmacy to dose IV heparin.  Earlier, nursing noted that heparin pump was turned off for an unknown amount of time overnight and re-started this morning.   -heparin level now at goal on 850 units/hr  Goal of Therapy: Heparin level 0.3-0.7 units/ml Monitor platelets by anticoagulation protocol: Yes  Plan: -Continue heparin at 850  units/hr -heparin level and CBC in am -apixaban to start 4/15 at 10am  Baxter Limber, PharmD Clinical Pharmacist **Pharmacist phone directory can now be found on [email or web address] ([email or web address]).  Listed under [facility].

## 2024-03-20 ENCOUNTER — Other Ambulatory Visit (HOSPITAL_COMMUNITY): Payer: Self-pay

## 2024-03-20 ENCOUNTER — Telehealth (HOSPITAL_COMMUNITY): Payer: Self-pay | Admitting: Pharmacy Technician

## 2024-03-20 DIAGNOSIS — I2602 Saddle embolus of pulmonary artery with acute cor pulmonale: Secondary | ICD-10-CM | POA: Diagnosis not present

## 2024-03-20 LAB — CBC
HCT: 33.9 % — ABNORMAL LOW (ref 36.0–46.0)
Hemoglobin: 11.2 g/dL — ABNORMAL LOW (ref 12.0–15.0)
MCH: 31 pg (ref 26.0–34.0)
MCHC: 33 g/dL (ref 30.0–36.0)
MCV: 93.9 fL (ref 80.0–100.0)
Platelets: 196 10*3/uL (ref 150–400)
RBC: 3.61 MIL/uL — ABNORMAL LOW (ref 3.87–5.11)
RDW: 13.9 % (ref 11.5–15.5)
WBC: 5.6 10*3/uL (ref 4.0–10.5)
nRBC: 0 % (ref 0.0–0.2)

## 2024-03-20 MED ORDER — APIXABAN 5 MG PO TABS: ORAL_TABLET | ORAL | 0 refills | Status: AC

## 2024-03-20 NOTE — Discharge Instructions (Addendum)
 Information on my medicine - ELIQUIS (apixaban)  This medication education was reviewed with me or my healthcare representative as part of my discharge preparation.  The pharmacist that spoke with me during my hospital stay was:  Jodell Munda, [facility]  Why was Eliquis prescribed for you? Eliquis was prescribed to treat blood clots that may have been found in the veins of your legs (deep vein thrombosis) or in your lungs (pulmonary embolism) and to reduce the risk of them occurring again.  What do You need to know about Eliquis ? The starting dose is 10 mg (two 5 mg tablets) taken TWICE daily for the FIRST SEVEN (7) DAYS, then on (enter date)  03/27/24  the dose is reduced to ONE 5 mg tablet taken TWICE daily.  Eliquis may be taken with or without food.   Try to take the dose about the same time in the morning and in the evening. If you have difficulty swallowing the tablet whole please discuss with your pharmacist how to take the medication safely.  Take Eliquis exactly as prescribed and DO NOT stop taking Eliquis without talking to the doctor who prescribed the medication.  Stopping may increase your risk of developing a new blood clot.  Refill your prescription before you run out.  After discharge, you should have regular check-up appointments with your healthcare provider that is prescribing your Eliquis.    What do you do if you miss a dose? If a dose of ELIQUIS is not taken at the scheduled time, take it as soon as possible on the same day and twice-daily administration should be resumed. The dose should not be doubled to make up for a missed dose.  Important Safety Information A possible side effect of Eliquis is bleeding. You should call your healthcare provider right away if you experience any of the following: Bleeding from an injury or your nose that does not stop. Unusual colored urine (red or dark brown) or unusual colored stools (red or black). Unusual bruising for  unknown reasons. A serious fall or if you hit your head (even if there is no bleeding).  Some medicines may interact with Eliquis and might increase your risk of bleeding or clotting while on Eliquis. To help avoid this, consult your healthcare provider or pharmacist prior to using any new prescription or non-prescription medications, including herbals, vitamins, non-steroidal anti-inflammatory drugs (NSAIDs) and supplements.  This website has more information on Eliquis (apixaban): http://[email or web address].com/eliquis/home

## 2024-03-20 NOTE — TOC Transition Note (Signed)
 Transition of Care [facility]) - Discharge Note   Patient Details  Name: Leah Knight MRN: [id number] Date of Birth: [birth year]  [facility] CM/SW Contact:  Eusebio High, RN Phone Number: 03/20/2024, 12:37 PM   Clinical Narrative:     Patient to DC to home today. Patient will follow up as directed on AVS. Family will transport            Patient Goals and CMS Choice            Discharge Placement                       Discharge Plan and Services Additional resources added to the After Visit Summary for                                       Social Drivers of Health (SDOH) Interventions SDOH Screenings   Food Insecurity: No Food Insecurity (03/17/2024)  Housing: Unknown (03/17/2024)  Transportation Needs: No Transportation Needs (03/18/2024)  Utilities: Not At Risk (03/18/2024)  Social Connections: Unknown (03/18/2024)  Tobacco Use: Low Risk  (03/17/2024)     Readmission Risk Interventions     No data to display

## 2024-03-20 NOTE — Telephone Encounter (Signed)
 Patient Product/process development scientist completed.    The patient is insured through [facility]. Patient has Medicare and is not eligible for a copay card, but may be able to apply for patient assistance or Medicare RX Payment Plan (Patient Must reach out to their plan, if eligible for payment plan), if available.    Ran test claim for enoxaparin (Lovenox) 80 mg/0.8 ml and the current 5 day co-pay is $77.09.   This test claim was processed through [facility]- copay amounts may vary at other pharmacies due to pharmacy/plan contracts, or as the patient moves through the different stages of their insurance plan.     Morgan Arab, CPHT Pharmacy Technician III Certified Patient Advocate [facility] Patient Advocate Team Direct Number: [phone]  Fax: [phone]

## 2024-03-20 NOTE — Discharge Summary (Signed)
 Leah Knight ZOX:[id number] DOB: 22-Sep-1946 DOA: 03/17/2024  PCP: Suzzanne Estrin, MD  Admit date: 03/17/2024  Discharge date: 03/20/2024  Admitted From: Home   disposition: Home   Recommendations for Outpatient Follow-up:   Follow up with PCP in 1-2 weeks.  Patient will need ongoing prescription for Eliquis which she will need to be on indefinitely.  Home Health: N/A Equipment/Devices: N/A Consultations: Pulmonary critical care, interventional radiology Discharge Condition: Improved CODE STATUS: Full Diet Recommendation: Regular diet  Diet Order             Diet general           Diet Heart Room service appropriate? 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 At that time she was treated with mechanical thrombectomy due to concern for retinal hemorrhage systemic thrombolysis was not pursued. She had a good outcome was discharged on room air and transition to Eliquis. She followed up in the hematology clinic with Dr. Lambert Pillion where her hypercoagulable workup was negative. They made a shared decision to stop anticoagulation at that time. She presents with acute onset of chest pain and difficulty breathing this morning while she bent over to pick something up from the floor when she woke up. She said it felt exactly like her pulmonary embolism symptoms. She denies any recent periods of prolonged immobility. She has a sedentary job. She notes a couple different  respiratory infections over the last few months but otherwise no significant morbidity. She denies any complications for bleeding with Eliquis. She has never had any intracranial hemorrhage or stroke. She has never had cancer. She says she is up-to-date on her cancer screening. She has no family history of pulmonary embolism or venous thromboembolism. Pulmonary is being consulted to assist with management of acute pulmonary embolism, unprovoked, recurrent with evidence of right heart strain and saddle distribution.                                                                   Hospital Course  Patient was admitted for her second episode of submassive pulmonary emboli, saddle distribution with right heart strain/cor pulmonale.  She had discontinued her Eliquis 6 months previously.  Patient was admitted to the intensive care unit and underwent mechanical thrombectomy which she tolerated well.  Patient felt much improved after thrombectomy.  She was placed  on heparin for 72 hours and was transitioned over to Eliquis.  Patient is discharged home on Eliquis to stay on that indefinitely.  Recurrent acute saddle PE, submassive with right heart strain S/p mechanical thrombectomy 4/12 States she feels well, feels like she is back to baseline Patient transitioned over to Eliquis this morning She had tolerated Eliquis well in the past with no bleeding Patient to follow-up with PCP and/or hematologist after discharge for ongoing Eliquis prescriptions.  HTN Continue propranolol and losartan   GERD Restart PPI      Discharge diagnosis     Principal Problem:   Acute pulmonary embolism with acute cor pulmonale [facility])    Discharge instructions    Discharge Instructions     Diet general   Complete by: As directed    Discharge instructions   Complete by: As directed    You are getting a 30-day supply of Eliquis.  As stated on the bottle, you will take 2 tablets twice a day for 7 days, you got  your first dose this morning.  After that you will cut down to 1 tablet twice a day indefinitely.  You will need to make an appointment with either your PCP or your hematologist to make sure you get a prescription so that you can continue taking this medication indefinitely.   Increase activity slowly   Complete by: As directed    No wound care   Complete by: As directed        Discharge Medications   Allergies as of 03/20/2024       Reactions   Pholcodine Other (See Comments)   This is an opioid cough suppressant- patient is unfamiliar with this entry   Sulfa Antibiotics Itching        Medication List     TAKE these medications    apixaban 5 MG Tabs tablet Commonly known as: ELIQUIS Take 2 tablets (10 mg total) by mouth 2 (two) times daily for 7 days, THEN 1 tablet (5 mg total) 2 (two) times daily for 23 days. Start taking on: March 20, 2024 What changed: See the new instructions.   loratadine 10 MG tablet Commonly known as: CLARITIN Take 10 mg by mouth daily as needed for allergies or rhinitis.   losartan 50 MG tablet Commonly known as: COZAAR Take 50 mg by mouth daily.   omeprazole 40 MG capsule Commonly known as: PRILOSEC Take 1 capsule (40 mg total) by mouth in the morning and at bedtime.   propranolol 40 MG tablet Commonly known as: INDERAL Take 40 mg by mouth daily.   Tums 500 MG chewable tablet Generic drug: calcium carbonate Chew 1-2 tablets by mouth 2 (two) times daily as needed for indigestion or heartburn.   Tylenol 8 Hour Arthritis Pain 650 MG CR tablet Generic drug: acetaminophen Take 650 mg by mouth every 8 (eight) hours as needed for pain.          Major procedures and Radiology Reports - PLEASE review detailed and final reports thoroughly  -        IR THROMBECT PRIM MECH INIT (INCLU) MOD SED Result Date: 03/18/2024 INDICATION: Submassive saddle and bilateral pulmonary embolism with shortness of breath, right heart strain, elevated  troponin level and oxygen requirement. 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 EXAM: 1. ULTRASOUND GUIDANCE FOR VASCULAR ACCESS OF THE RIGHT COMMON FEMORAL VEIN 2. BILATERAL PULMONARY ARTERIOGRAPHY INCLUDING SELECTIVE ARTERIOGRAPHY THE RIGHT INTER LOBAR  PULMONARY ARTERY AND LEFT LOWER LOBE PULMONARY ARTERY 3. PERCUTANEOUS INTRALUMINAL MECHANICAL THROMBECTOMY OF RIGHT PULMONARY ARTERY 4. ADDITIONAL SECOND PERCUTANEOUS INTRALUMINAL MECHANICAL THROMBECTOMY OF LEFT PULMONARY ARTERY COMPARISON:  CTA of the chest on the same day. MEDICATIONS: None. ANESTHESIA/SEDATION: Moderate (conscious) sedation was employed during this procedure. A total of Versed 2.0 mg and Fentanyl 100 mcg was administered intravenously by the radiology nurse. Total intra-service moderate Sedation Time: 84 minutes. The patient's level of consciousness and vital signs were monitored continuously by radiology nursing throughout the procedure under my direct supervision. FLUOROSCOPY: Radiation Exposure Index (as provided by the fluoroscopic device): 63 mGy Kerma COMPLICATIONS: None immediate. TECHNIQUE: Informed written consent was obtained from the patient after a thorough discussion of the procedural risks, benefits and alternatives. All questions were addressed. Maximal Sterile Barrier Technique was utilized including caps, mask, sterile gowns, sterile gloves, sterile drape, hand hygiene and skin antiseptic. A timeout was performed prior to the initiation of the procedure. Both groins were prepped with chlorhexidine and local anesthesia was provided with 1% lidocaine. Patency of the right common femoral vein and saphenofemoral junction were confirmed by ultrasound with an ultrasound image recorded. Access of the right common femoral vein was performed near the saphenofemoral junction under direct ultrasound guidance with a micropuncture set. A 6 French sheath  was initially placed over a guidewire. A 6 French angled pigtail catheter was advanced over a guidewire into the inferior vena cava and further into the right atrium. The catheter was then further advanced through the right ventricle and into the pulmonary outflow tract. Pulmonary artery pressures were measured and documented in the distal main pulmonary artery at the bifurcation into right and left pulmonary arteries. Pulmonary arteriography was then initially performed at the level of the distal main pulmonary artery. The angled pigtail catheter was then removed over a Rosen wire which was advanced into the right pulmonary artery. Additional sub-selective arteriography was performed through a 5 [place] Vert catheter at the level of the right inter lobar pulmonary artery. The catheter was then removed over a guidewire. The 6 French sheath was removed and a 24 [place] Inari introducer sheath was advanced into the upper IVC. The Rosen wire was then exchanged over a catheter for a stiff Amplatz wire. An Karie Kirks [phone] aspiration catheter was then advanced over a guidewire into the right lower lobe pulmonary artery under fluoroscopic guidance. Multiple mechanical thrombectomy aspiration pulls were performed at this level and the aspiration catheter also gently retracted over the guidewire. During the procedure, aspirated blood was filtered through the Flow Saver blood return system and the filtered blood re-infused back into the patient via the introducer sheath. Mechanical thrombectomy was also performed at the level of the interlobar and main right pulmonary artery. Completion arteriography of the right pulmonary artery was performed through the 24 French aspiration catheter. The aspiration catheter was then directed into the left pulmonary artery after guidewire redirection into the left lower lobe pulmonary artery. Multiple mechanical thrombectomy aspiration pulls were performed at this level and the aspiration  catheter also gently retracted over the guidewire. Completion arteriography was performed at the level the left lower lobe pulmonary artery through the 24 French aspiration catheter. The catheter was then retracted into the main pulmonary outflow tract and postprocedure pulmonary artery pressure measurements obtained and documented. A Prolene pursestring suture was applied at the femoral sheath exit site and the sheath removed. Manual compression was applied at the sheath exit site. FINDINGS: Prior to thrombectomy, initial measured pulmonary artery pressures  were 75/30, mean 48 mm Hg. Arteriography at the level of the main pulmonary artery demonstrates nonocclusive thrombus in the left lower lobe pulmonary artery and very poor perfusion of the right lung due to thrombus. Component of saddle embolism seen by CTA is difficult to discretely see by arteriography but is suspected across the bifurcation into left and right pulmonary arteries. Additional selective catheter arteriography at the level of the interlobar right pulmonary artery demonstrates nonocclusive thrombus extending into right lower lobe and middle lobe branches with atelectasis of the right middle lobe and lower lobe and atretic appearance of right middle lobe and lower lobe pulmonary arteries. Initial intraluminal mechanical thrombectomy at the level of the right lower lobe pulmonary artery, interlobar pulmonary artery and right pulmonary artery yielded moderate thrombus. 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 This was also confirmed on completion arteriography with arteriography demonstrating no significant residual thrombus on the right but clear preferential flow in the right upper lobe with the right middle lobe and lower lobe demonstrating attenuated flow and attenuated appearance of vessels likely on the basis of chronic thrombus. The more central right pulmonary artery pass yielded would appear to be likely  consistent with the saddle component of thrombus. Left lower lobe intraluminal mechanical thrombectomy yielded moderate thrombus including a significant intact acute appearing thrombus. Completion arteriography demonstrates no significant residual thrombus remaining with some small peripheral perfusion defects in the left upper lobe and left lower lobe remaining. Completion pulmonary artery pressure showed significant reduction to 51/22, mean 34 mm Hg. IMPRESSION: Successful bilateral pulmonary arterial intraluminal mechanical thrombectomy as documented above with yield of moderate bilateral thrombus. 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 It was felt that the majority of acute thrombus was able to be removed including the saddle component. No significant residual left-sided thrombus was identified on postprocedural arteriography except for a few small peripheral defects. Pulmonary artery pressure showed improvement after thrombectomy with reduction from preprocedural measurement of 75/30, a mean 48 mm Hg to 51/22 mean 34 mm mercury on completion. Electronically Signed   By: Erica Hau M.D.   On: 03/18/2024 10:42   IR Angiogram Pulmonary Bilateral Selective Result Date: 03/18/2024 INDICATION: Submassive saddle and bilateral pulmonary embolism with shortness of breath, right heart strain, elevated troponin level and oxygen requirement. 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 EXAM: 1. ULTRASOUND GUIDANCE FOR VASCULAR ACCESS OF THE RIGHT COMMON FEMORAL VEIN 2. BILATERAL PULMONARY ARTERIOGRAPHY INCLUDING SELECTIVE ARTERIOGRAPHY THE RIGHT INTER LOBAR PULMONARY  ARTERY AND LEFT LOWER LOBE PULMONARY ARTERY 3. PERCUTANEOUS INTRALUMINAL MECHANICAL THROMBECTOMY OF RIGHT PULMONARY ARTERY 4. ADDITIONAL SECOND PERCUTANEOUS INTRALUMINAL MECHANICAL THROMBECTOMY OF LEFT PULMONARY ARTERY COMPARISON:  CTA of the chest on the same day. MEDICATIONS: None. ANESTHESIA/SEDATION: Moderate (conscious) sedation was employed during this procedure. A total of Versed 2.0 mg and Fentanyl 100 mcg was administered intravenously by the radiology nurse. Total intra-service moderate Sedation Time: 84 minutes. The patient's level of consciousness and vital signs were monitored continuously by radiology nursing throughout the procedure under my direct supervision. FLUOROSCOPY: Radiation Exposure Index (as provided by the fluoroscopic device): 63 mGy Kerma COMPLICATIONS: None immediate. TECHNIQUE: Informed written consent was obtained from the patient after a thorough discussion  of the procedural risks, benefits and alternatives. All questions were addressed. Maximal Sterile Barrier Technique was utilized including caps, mask, sterile gowns, sterile gloves, sterile drape, hand hygiene and skin antiseptic. A timeout was performed prior to the initiation of the procedure. Both groins were prepped with chlorhexidine and local anesthesia was provided with 1% lidocaine. Patency of the right common femoral vein and saphenofemoral junction were confirmed by ultrasound with an ultrasound image recorded. Access of the right common femoral vein was performed near the saphenofemoral junction under direct ultrasound guidance with a micropuncture set. A 6 French sheath was initially placed over a guidewire. A 6 French angled pigtail catheter was advanced over a guidewire into the inferior vena cava and further into the right atrium. The catheter was then further advanced through the right ventricle and into the pulmonary outflow tract. Pulmonary artery pressures were measured and documented in the distal main  pulmonary artery at the bifurcation into right and left pulmonary arteries. Pulmonary arteriography was then initially performed at the level of the distal main pulmonary artery. The angled pigtail catheter was then removed over a Rosen wire which was advanced into the right pulmonary artery. Additional sub-selective arteriography was performed through a 5 [place] Vert catheter at the level of the right inter lobar pulmonary artery. The catheter was then removed over a guidewire. The 6 French sheath was removed and a 24 [place] Inari introducer sheath was advanced into the upper IVC. The Rosen wire was then exchanged over a catheter for a stiff Amplatz wire. An Karie Kirks [phone] aspiration catheter was then advanced over a guidewire into the right lower lobe pulmonary artery under fluoroscopic guidance. Multiple mechanical thrombectomy aspiration pulls were performed at this level and the aspiration catheter also gently retracted over the guidewire. During the procedure, aspirated blood was filtered through the Flow Saver blood return system and the filtered blood re-infused back into the patient via the introducer sheath. Mechanical thrombectomy was also performed at the level of the interlobar and main right pulmonary artery. Completion arteriography of the right pulmonary artery was performed through the 24 French aspiration catheter. The aspiration catheter was then directed into the left pulmonary artery after guidewire redirection into the left lower lobe pulmonary artery. Multiple mechanical thrombectomy aspiration pulls were performed at this level and the aspiration catheter also gently retracted over the guidewire. Completion arteriography was performed at the level the left lower lobe pulmonary artery through the 24 French aspiration catheter. The catheter was then retracted into the main pulmonary outflow tract and postprocedure pulmonary artery pressure measurements obtained and documented. A Prolene  pursestring suture was applied at the femoral sheath exit site and the sheath removed. Manual compression was applied at the sheath exit site. FINDINGS: Prior to thrombectomy, initial measured pulmonary artery pressures were 75/30, mean 48 mm Hg. Arteriography at the level of the main pulmonary artery demonstrates nonocclusive thrombus in the left lower lobe pulmonary artery and very poor perfusion of the right lung due to thrombus. Component of saddle embolism seen by CTA is difficult to discretely see by arteriography but is suspected across the bifurcation into left and right pulmonary arteries. Additional selective catheter arteriography at the level of the interlobar right pulmonary artery demonstrates nonocclusive thrombus extending into right lower lobe and middle lobe branches with atelectasis of the right middle lobe and lower lobe and atretic appearance of right middle lobe and lower lobe pulmonary arteries. Initial intraluminal mechanical thrombectomy at the level of the right lower lobe pulmonary artery,  interlobar pulmonary artery and right pulmonary artery yielded moderate thrombus. 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 This was also confirmed on completion arteriography with arteriography demonstrating no significant residual thrombus on the right but clear preferential flow in the right upper lobe with the right middle lobe and lower lobe demonstrating attenuated flow and attenuated appearance of vessels likely on the basis of chronic thrombus. The more central right pulmonary artery pass yielded would appear to be likely consistent with the saddle component of thrombus. Left lower lobe intraluminal mechanical thrombectomy yielded moderate thrombus including a significant intact acute appearing thrombus. Completion arteriography demonstrates no significant residual thrombus remaining with some small peripheral perfusion defects in the left upper lobe and left  lower lobe remaining. Completion pulmonary artery pressure showed significant reduction to 51/22, mean 34 mm Hg. IMPRESSION: Successful bilateral pulmonary arterial intraluminal mechanical thrombectomy as documented above with yield of moderate bilateral thrombus. 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 It was felt that the majority of acute thrombus was able to be removed including the saddle component. No significant residual left-sided thrombus was identified on postprocedural arteriography except for a few small peripheral defects. Pulmonary artery pressure showed improvement after thrombectomy with reduction from preprocedural measurement of 75/30, a mean 48 mm Hg to 51/22 mean 34 mm mercury on completion. Electronically Signed   By: Irish Lack M.D.   On: 03/18/2024 10:42   IR US Guide Vasc Access Right Result Date: 03/18/2024 INDICATION: Submassive saddle and bilateral pulmonary embolism with shortness of breath, right heart strain, elevated troponin level and oxygen requirement. 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 EXAM: 1. ULTRASOUND GUIDANCE FOR VASCULAR ACCESS OF THE RIGHT COMMON FEMORAL VEIN 2. BILATERAL PULMONARY ARTERIOGRAPHY INCLUDING SELECTIVE ARTERIOGRAPHY THE RIGHT INTER LOBAR PULMONARY ARTERY AND LEFT LOWER LOBE PULMONARY ARTERY 3. PERCUTANEOUS INTRALUMINAL MECHANICAL THROMBECTOMY OF RIGHT PULMONARY ARTERY 4. ADDITIONAL SECOND PERCUTANEOUS INTRALUMINAL MECHANICAL THROMBECTOMY OF LEFT PULMONARY ARTERY COMPARISON:  CTA of the chest on the same day. MEDICATIONS: None. ANESTHESIA/SEDATION: Moderate (conscious) sedation was employed during  this procedure. A total of Versed 2.0 mg and Fentanyl 100 mcg was administered intravenously by the radiology nurse. Total intra-service moderate Sedation Time: 84 minutes. The patient's level of consciousness and vital signs were monitored continuously by radiology nursing throughout the procedure under my direct supervision. FLUOROSCOPY: Radiation Exposure Index (as provided by the fluoroscopic device): 63 mGy Kerma COMPLICATIONS: None immediate. TECHNIQUE: Informed written consent was obtained from the patient after a thorough discussion of the procedural risks, benefits and alternatives. All questions were addressed. Maximal Sterile Barrier Technique was utilized including caps, mask, sterile gowns, sterile gloves, sterile drape, hand hygiene and skin antiseptic. A timeout was performed prior to the initiation of the procedure. Both groins were prepped with chlorhexidine and local anesthesia was provided with 1% lidocaine. Patency of the right common femoral vein and saphenofemoral junction were confirmed by ultrasound with an ultrasound image recorded. Access of the right common femoral vein was performed near the saphenofemoral junction under direct ultrasound guidance with a micropuncture set. A 6 French sheath was initially placed over a guidewire. A 6 French angled pigtail catheter was advanced over a guidewire into the inferior vena cava  and further into the right atrium. The catheter was then further advanced through the right ventricle and into the pulmonary outflow tract. Pulmonary artery pressures were measured and documented in the distal main pulmonary artery at the bifurcation into right and left pulmonary arteries. Pulmonary arteriography was then initially performed at the level of the distal main pulmonary artery. The angled pigtail catheter was then removed over a Rosen wire which was advanced into the right pulmonary artery. Additional sub-selective arteriography was performed through a 5  [place] Vert catheter at the level of the right inter lobar pulmonary artery. The catheter was then removed over a guidewire. The 6 French sheath was removed and a 24 [place] Inari introducer sheath was advanced into the upper IVC. The Rosen wire was then exchanged over a catheter for a stiff Amplatz wire. An Mendel Stain [phone] aspiration catheter was then advanced over a guidewire into the right lower lobe pulmonary artery under fluoroscopic guidance. Multiple mechanical thrombectomy aspiration pulls were performed at this level and the aspiration catheter also gently retracted over the guidewire. During the procedure, aspirated blood was filtered through the Flow Saver blood return system and the filtered blood re-infused back into the patient via the introducer sheath. Mechanical thrombectomy was also performed at the level of the interlobar and main right pulmonary artery. Completion arteriography of the right pulmonary artery was performed through the 24 French aspiration catheter. The aspiration catheter was then directed into the left pulmonary artery after guidewire redirection into the left lower lobe pulmonary artery. Multiple mechanical thrombectomy aspiration pulls were performed at this level and the aspiration catheter also gently retracted over the guidewire. Completion arteriography was performed at the level the left lower lobe pulmonary artery through the 24 French aspiration catheter. The catheter was then retracted into the main pulmonary outflow tract and postprocedure pulmonary artery pressure measurements obtained and documented. A Prolene pursestring suture was applied at the femoral sheath exit site and the sheath removed. Manual compression was applied at the sheath exit site. FINDINGS: Prior to thrombectomy, initial measured pulmonary artery pressures were 75/30, mean 48 mm Hg. Arteriography at the level of the main pulmonary artery demonstrates nonocclusive thrombus in the left lower lobe  pulmonary artery and very poor perfusion of the right lung due to thrombus. Component of saddle embolism seen by CTA is difficult to discretely see by arteriography but is suspected across the bifurcation into left and right pulmonary arteries. Additional selective catheter arteriography at the level of the interlobar right pulmonary artery demonstrates nonocclusive thrombus extending into right lower lobe and middle lobe branches with atelectasis of the right middle lobe and lower lobe and atretic appearance of right middle lobe and lower lobe pulmonary arteries. Initial intraluminal mechanical thrombectomy at the level of the right lower lobe pulmonary artery, interlobar pulmonary artery and right pulmonary artery yielded moderate thrombus. 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 This was also confirmed on completion arteriography with arteriography demonstrating no significant residual thrombus on the right but clear preferential flow in the right upper lobe with the right middle lobe and lower lobe demonstrating attenuated flow and attenuated appearance of vessels likely on the basis of chronic thrombus. The more central right pulmonary artery pass yielded would appear to be likely consistent with the saddle component of thrombus. Left lower lobe intraluminal mechanical thrombectomy yielded moderate thrombus including a significant intact acute appearing thrombus. Completion arteriography demonstrates no significant residual thrombus remaining with some small peripheral perfusion defects  in the left upper lobe and left lower lobe remaining. Completion pulmonary artery pressure showed significant reduction to 51/22, mean 34 mm Hg. IMPRESSION: Successful bilateral pulmonary arterial intraluminal mechanical thrombectomy as documented above with yield of moderate bilateral thrombus. There clearly was a component of chronic thrombus in the right lower lobe and right middle lobe  pulmonary arteries which demonstrate decreased and attenuated flow on postprocedural angiography despite not containing significant residual thrombus with preferential right-sided flow in the right upper lobe. It was felt that the majority of acute thrombus was able to be removed including the saddle component. No significant residual left-sided thrombus was identified on postprocedural arteriography except for a few small peripheral defects. Pulmonary artery pressure showed improvement after thrombectomy with reduction from preprocedural measurement of 75/30, a mean 48 mm Hg to 51/22 mean 34 mm mercury on completion. Electronically Signed   By: Erica Hau M.D.   On: 03/18/2024 10:42   IR THROMBECT SEC MECH MOD SED Result Date: 03/18/2024 INDICATION: Submassive saddle and bilateral pulmonary embolism with shortness of breath, right heart strain, elevated troponin level and oxygen requirement. 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 EXAM: 1. ULTRASOUND GUIDANCE FOR VASCULAR ACCESS OF THE RIGHT COMMON FEMORAL VEIN 2. BILATERAL PULMONARY ARTERIOGRAPHY INCLUDING SELECTIVE ARTERIOGRAPHY THE RIGHT INTER LOBAR PULMONARY ARTERY AND LEFT LOWER LOBE PULMONARY ARTERY 3. PERCUTANEOUS INTRALUMINAL MECHANICAL THROMBECTOMY OF RIGHT PULMONARY ARTERY 4. ADDITIONAL SECOND PERCUTANEOUS INTRALUMINAL MECHANICAL THROMBECTOMY OF LEFT PULMONARY ARTERY COMPARISON:  CTA of the chest on the same day. MEDICATIONS: None. ANESTHESIA/SEDATION: Moderate (conscious) sedation was employed during this procedure. A total of Versed 2.0 mg and Fentanyl 100 mcg was administered intravenously by the radiology nurse. Total intra-service moderate Sedation Time: 84 minutes. The patient's level of consciousness and vital signs were monitored continuously by radiology nursing throughout the procedure under my direct supervision. FLUOROSCOPY: Radiation Exposure  Index (as provided by the fluoroscopic device): 63 mGy Kerma COMPLICATIONS: None immediate. TECHNIQUE: Informed written consent was obtained from the patient after a thorough discussion of the procedural risks, benefits and alternatives. All questions were addressed. Maximal Sterile Barrier Technique was utilized including caps, mask, sterile gowns, sterile gloves, sterile drape, hand hygiene and skin antiseptic. A timeout was performed prior to the initiation of the procedure. Both groins were prepped with chlorhexidine and local anesthesia was provided with 1% lidocaine. Patency of the right common femoral vein and saphenofemoral junction were confirmed by ultrasound with an ultrasound image recorded. Access of the right common femoral vein was performed near the saphenofemoral junction under direct ultrasound guidance with a micropuncture set. A 6 French sheath was initially placed over a guidewire. A 6 French angled pigtail catheter was advanced over a guidewire into the inferior vena cava and further into the right atrium. The catheter was then further advanced through the right ventricle and into the pulmonary outflow tract. Pulmonary artery pressures were measured and documented in the distal main pulmonary artery at the bifurcation into right and left pulmonary arteries. Pulmonary arteriography was then initially performed at the level of the distal main pulmonary artery. The angled pigtail catheter was then removed over a Rosen wire which was advanced into the right pulmonary artery. Additional sub-selective arteriography was performed through a 5 [place] Vert catheter at the level of the right inter lobar pulmonary artery. The catheter was then removed over a guidewire. The 6 French sheath was removed and a 24 [place] Inari introducer sheath was advanced  into the upper IVC. The Rosen wire was then exchanged over a catheter for a stiff Amplatz wire. An Karie Kirks [phone] aspiration catheter was then advanced  over a guidewire into the right lower lobe pulmonary artery under fluoroscopic guidance. Multiple mechanical thrombectomy aspiration pulls were performed at this level and the aspiration catheter also gently retracted over the guidewire. During the procedure, aspirated blood was filtered through the Flow Saver blood return system and the filtered blood re-infused back into the patient via the introducer sheath. Mechanical thrombectomy was also performed at the level of the interlobar and main right pulmonary artery. Completion arteriography of the right pulmonary artery was performed through the 24 French aspiration catheter. The aspiration catheter was then directed into the left pulmonary artery after guidewire redirection into the left lower lobe pulmonary artery. Multiple mechanical thrombectomy aspiration pulls were performed at this level and the aspiration catheter also gently retracted over the guidewire. Completion arteriography was performed at the level the left lower lobe pulmonary artery through the 24 French aspiration catheter. The catheter was then retracted into the main pulmonary outflow tract and postprocedure pulmonary artery pressure measurements obtained and documented. A Prolene pursestring suture was applied at the femoral sheath exit site and the sheath removed. Manual compression was applied at the sheath exit site. FINDINGS: Prior to thrombectomy, initial measured pulmonary artery pressures were 75/30, mean 48 mm Hg. Arteriography at the level of the main pulmonary artery demonstrates nonocclusive thrombus in the left lower lobe pulmonary artery and very poor perfusion of the right lung due to thrombus. Component of saddle embolism seen by CTA is difficult to discretely see by arteriography but is suspected across the bifurcation into left and right pulmonary arteries. Additional selective catheter arteriography at the level of the interlobar right pulmonary artery demonstrates  nonocclusive thrombus extending into right lower lobe and middle lobe branches with atelectasis of the right middle lobe and lower lobe and atretic appearance of right middle lobe and lower lobe pulmonary arteries. Initial intraluminal mechanical thrombectomy at the level of the right lower lobe pulmonary artery, interlobar pulmonary artery and right pulmonary artery yielded moderate thrombus. 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 This was also confirmed on completion arteriography with arteriography demonstrating no significant residual thrombus on the right but clear preferential flow in the right upper lobe with the right middle lobe and lower lobe demonstrating attenuated flow and attenuated appearance of vessels likely on the basis of chronic thrombus. The more central right pulmonary artery pass yielded would appear to be likely consistent with the saddle component of thrombus. Left lower lobe intraluminal mechanical thrombectomy yielded moderate thrombus including a significant intact acute appearing thrombus. Completion arteriography demonstrates no significant residual thrombus remaining with some small peripheral perfusion defects in the left upper lobe and left lower lobe remaining. Completion pulmonary artery pressure showed significant reduction to 51/22, mean 34 mm Hg. IMPRESSION: Successful bilateral pulmonary arterial intraluminal mechanical thrombectomy as documented above with yield of moderate bilateral thrombus. 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 It was felt that the majority of acute thrombus was able to be removed including the saddle component. No significant residual left-sided thrombus  was identified on postprocedural arteriography except for a few small peripheral defects. Pulmonary artery pressure  showed improvement after thrombectomy with reduction from preprocedural measurement of 75/30, a mean 48 mm Hg to 51/22 mean 34 mm mercury on completion. Electronically Signed   By: Irish Lack M.D.   On: 03/18/2024 10:42   CT Angio Chest PE W and/or Wo Contrast Result Date: 03/17/2024 CLINICAL DATA:  Chest pain. Shortness of breath. History of prior PE EXAM: CT ANGIOGRAPHY CHEST WITH CONTRAST TECHNIQUE: Multidetector CT imaging of the chest was performed using the standard protocol during bolus administration of intravenous contrast. Multiplanar CT image reconstructions and MIPs were obtained to evaluate the vascular anatomy. RADIATION DOSE REDUCTION: This exam was performed according to the departmental dose-optimization program which includes automated exposure control, adjustment of the mA and/or kV according to patient size and/or use of iterative reconstruction technique. CONTRAST:  75mL OMNIPAQUE IOHEXOL 350 MG/ML SOLN COMPARISON:  CTA 05/31/2023 FINDINGS: Cardiovascular: Scattered vascular calcifications identified along the thoracic aorta and coronary arteries. Normal course and caliber to the thoracic aorta. Heart is nonenlarged. There is some left ventricular wall hypertrophy. Please correlate for any history of hypertension. Large pulmonary emboli identified including a saddle embolus across the bifurcation of the main pulmonary artery. Significant emboli along the lobar and segmental vessels more in the lower lobes and right upper lobe in particular. There is also some reflux of contrast into the intrahepatic veins and IVC. Slight flattening of the intraventricular septum. Please correlate for any clinical evidence of right heart strain. Mediastinum/Nodes: Preserved thyroid gland. Slightly patulous thoracic esophagus small hiatal hernia. No specific abnormal lymph node enlargement  identified in the axillary regions, hilum or mediastinum. Exception is a small subcarinal node identified measuring 9 mm in short axis on series 6, image 52. Not simply changed when adjusted for technique from the prior. Lungs/Pleura: Breathing motion identified. No consolidation, pneumothorax or effusion. Mild scattered ground-glass, mosaic appearance of the lungs. Upper Abdomen: Adrenal glands are preserved in the upper abdomen. Musculoskeletal: Scattered degenerative changes along the spine with disc height loss and osteophytes. Review of the MIP images confirms the above findings. Critical Value/emergent results were called by telephone at the time of interpretation on 03/17/2024 at 12:14 pm to provider [facility] , who verbally acknowledged these results. IMPRESSION: Large, extensive pulmonary emboli including central saddle emboli. There are some changes suggestive of potential right heart strain. Please correlate with clinical presentation. Small hiatal hernia. Aortic Atherosclerosis (ICD10-I70.0). Electronically Signed   By: Karen Kays M.D.   On: 03/17/2024 12:15    Micro Results    Recent Results (from the past 240 hours)  Resp panel by RT-PCR (RSV, Flu A&B, Covid) Anterior Nasal Swab     Status: None   Collection Time: 03/17/24 10:27 AM   Specimen: Anterior Nasal Swab  Result Value Ref Range Status   SARS Coronavirus 2 by RT PCR NEGATIVE NEGATIVE Final    Comment: (NOTE) SARS-CoV-2 target nucleic acids are NOT DETECTED.  The SARS-CoV-2 RNA is generally detectable in upper respiratory specimens during the acute phase of infection. The lowest concentration of SARS-CoV-2 viral copies this assay can detect is 138 copies/mL. A negative result does not preclude SARS-Cov-2 infection and should not be used as the sole basis for treatment or other patient management decisions. A negative result may occur with  improper specimen collection/handling, submission of specimen other than  nasopharyngeal swab, presence of viral mutation(s) within the areas targeted by this assay, and inadequate number of viral copies(<138 copies/mL). A negative result must be combined with clinical observations, patient history,  and epidemiological information. The expected result is Negative.  Fact Sheet for Patients:  BloggerCourse.com  Fact Sheet for Healthcare Providers:  SeriousBroker.it  This test is no t yet approved or cleared by the [place] FDA and  has been authorized for detection and/or diagnosis of SARS-CoV-2 by FDA under an Emergency Use Authorization (EUA). This EUA will remain  in effect (meaning this test can be used) for the duration of the COVID-19 declaration under Section 564(b)(1) of the Act, 21 U.S.C.section 360bbb-3(b)(1), unless the authorization is terminated  or revoked sooner.       Influenza A by PCR NEGATIVE NEGATIVE Final   Influenza B by PCR NEGATIVE NEGATIVE Final    Comment: (NOTE) The Xpert Xpress SARS-CoV-2/FLU/RSV plus assay is intended as an aid in the diagnosis of influenza from Nasopharyngeal swab specimens and should not be used as a sole basis for treatment. Nasal washings and aspirates are unacceptable for Xpert Xpress SARS-CoV-2/FLU/RSV testing.  Fact Sheet for Patients: BloggerCourse.com  Fact Sheet for Healthcare Providers: SeriousBroker.it  This test is not yet approved or cleared by the [place] FDA and has been authorized for detection and/or diagnosis of SARS-CoV-2 by FDA under an Emergency Use Authorization (EUA). This EUA will remain in effect (meaning this test can be used) for the duration of the COVID-19 declaration under Section 564(b)(1) of the Act, 21 U.S.C. section 360bbb-3(b)(1), unless the authorization is terminated or revoked.     Resp Syncytial Virus by PCR NEGATIVE NEGATIVE Final    Comment:  (NOTE) Fact Sheet for Patients: BloggerCourse.com  Fact Sheet for Healthcare Providers: SeriousBroker.it  This test is not yet approved or cleared by the [place] FDA and has been authorized for detection and/or diagnosis of SARS-CoV-2 by FDA under an Emergency Use Authorization (EUA). This EUA will remain in effect (meaning this test can be used) for the duration of the COVID-19 declaration under Section 564(b)(1) of the Act, 21 U.S.C. section 360bbb-3(b)(1), unless the authorization is terminated or revoked.  Performed at [facility], [address], [place], [place] [address]   MRSA Next Gen by PCR, Nasal     Status: None   Collection Time: 03/17/24  5:21 PM   Specimen: Nasal Mucosa; Nasal Swab  Result Value Ref Range Status   MRSA by PCR Next Gen NOT DETECTED NOT DETECTED Final    Comment: (NOTE) The GeneXpert MRSA Assay (FDA approved for NASAL specimens only), is one component of a comprehensive MRSA colonization surveillance program. It is not intended to diagnose MRSA infection nor to guide or monitor treatment for MRSA infections. Test performance is not FDA approved in patients less than 50 years old. Performed at [facility], [address], [place], [place] [address]     Today   Subjective    Starletta Houchin feels much improved since admission.  Feels ready to go home.  Denies chest pain, shortness of breath or abdominal pain.  Feels they can take care of themselves with the resources they have at home.  Objective   Blood pressure 127/75, pulse 63, temperature 97.7 F (36.5 C), temperature source Oral, resp. rate 18, height 5\' 2"  (1.575 m), weight 70.5 kg, SpO2 98%.  No intake or output data in the 24 hours ending 03/20/24 1146  Exam General: Patient appears well and in good spirits sitting up in bed in no acute distress.  Eyes: sclera anicteric, conjuctiva mild injection  bilaterally CVS: S1-S2, regular  Respiratory:  decreased air entry  bilaterally secondary to decreased inspiratory effort, rales at bases  GI: NABS, soft, NT  LE: No edema.  Neuro: A/O x 3, Moving all extremities equally with normal strength, CN 3-12 intact, grossly nonfocal.  Psych: patient is logical and coherent, judgement and insight appear normal, mood and affect appropriate to situation.    Data Review   CBC w Diff:  Lab Results  Component Value Date   WBC 5.6 03/20/2024   HGB 11.2 (L) 03/20/2024   HCT 33.9 (L) 03/20/2024   PLT 196 03/20/2024   LYMPHOPCT 12 03/17/2024   MONOPCT 10 03/17/2024   EOSPCT 1 03/17/2024   BASOPCT 1 03/17/2024    CMP:  Lab Results  Component Value Date   NA 141 03/18/2024   K 3.9 03/18/2024   CL 110 03/18/2024   CO2 20 (L) 03/18/2024   BUN 12 03/18/2024   CREATININE 1.02 (H) 03/18/2024   CREATININE 0.92 12/15/2014   PROT 7.3 03/17/2024   ALBUMIN 3.7 03/17/2024   BILITOT 0.8 03/17/2024   ALKPHOS 69 03/17/2024   AST 19 03/17/2024   ALT 19 03/17/2024  .   Total Time in preparing paper work, data evaluation and todays exam - 35 minutes  Zayn Selley Tublu Brieana Shimmin M.D on 03/20/2024 at 11:46 AM  [facility]

## 2024-03-23 LAB — POCT ACTIVATED CLOTTING TIME: Activated Clotting Time: 182 s

## 2024-04-02 ENCOUNTER — Other Ambulatory Visit: Payer: Self-pay | Admitting: Internal Medicine

## 2024-04-02 DIAGNOSIS — I2699 Other pulmonary embolism without acute cor pulmonale: Secondary | ICD-10-CM

## 2024-04-10 ENCOUNTER — Other Ambulatory Visit: Payer: Self-pay | Admitting: Internal Medicine

## 2024-04-10 DIAGNOSIS — Z1289 Encounter for screening for malignant neoplasm of other sites: Secondary | ICD-10-CM

## 2024-04-13 ENCOUNTER — Ambulatory Visit
Admission: RE | Admit: 2024-04-13 | Discharge: 2024-04-13 | Disposition: A | Discharge status: Home / Self Care | Source: Ambulatory Visit | Attending: Internal Medicine | Admitting: Internal Medicine

## 2024-04-13 ENCOUNTER — Other Ambulatory Visit

## 2024-04-13 DIAGNOSIS — Z1289 Encounter for screening for malignant neoplasm of other sites: Secondary | ICD-10-CM

## 2024-04-13 MED ORDER — IOPAMIDOL (ISOVUE-300) INJECTION 61%
100.0000 mL | Freq: Once | INTRAVENOUS | Status: AC | PRN
  Administered 2024-04-13: 100 mL via INTRAVENOUS

## 2024-04-26 ENCOUNTER — Ambulatory Visit
Admission: RE | Admit: 2024-04-26 | Discharge: 2024-04-26 | Disposition: A | Discharge status: Home / Self Care | Source: Ambulatory Visit | Attending: Radiology | Admitting: Radiology

## 2024-04-26 DIAGNOSIS — I2602 Saddle embolus of pulmonary artery with acute cor pulmonale: Secondary | ICD-10-CM

## 2024-04-26 HISTORY — PX: IR RADIOLOGIST EVAL & MGMT: IMG5224

## 2024-04-26 NOTE — Progress Notes (Signed)
 Chief Complaint: Patient was consulted remotely today (TeleHealth) for follow up after pulmonary thrombectomy on 03/17/2024.  History of Present Illness: Leah Knight is a 78 y.o. female status post bilateral pulmonary arterial mechanical suction thrombectomy on 03/17/24 following significant bilateral acute pulmonary embolism with right heart strain. This was her second thrombectomy following a similar event on 05/31/23. She did very well after the procedure with ability to remove significant acute and chronic appearing thrombus and significant reduction in pulmonary artery pressures during the procedure. She was discharged 3 days after the procedure on Eliquis . She has been doing well after discharge, went back to work the next week and is doing yard work. She subsequently quit working. She denies lower extremity edema or shortness of breath. She has followed up with Dr. Tisovec since discharge. A CT of the abdomen and pelvis was performed on 04/13/24 to exclude any pathology and was normal.  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   Hx of retinal hemorrhage    Hyperlipemia    Hypertension    Sleep apnea    Stroke [facility])     Past Surgical History:  Procedure Laterality Date   CATARACT EXTRACTION, BILATERAL  summer 2021   childbirth     x 2   IR ANGIOGRAM PULMONARY BILATERAL SELECTIVE  05/31/2023   IR ANGIOGRAM PULMONARY BILATERAL SELECTIVE  03/17/2024   IR ANGIOGRAM SELECTIVE EACH ADDITIONAL VESSEL  05/31/2023   IR ANGIOGRAM SELECTIVE EACH ADDITIONAL VESSEL  05/31/2023   IR THROMBECT PRIM MECH INIT (INCLU) MOD SED  05/31/2023   IR THROMBECT PRIM MECH INIT (INCLU) MOD SED  05/31/2023   IR THROMBECT PRIM MECH INIT (INCLU) MOD SED  03/17/2024   IR THROMBECT SEC MECH MOD SED  03/17/2024   IR US  GUIDE VASC ACCESS RIGHT  05/31/2023   IR US  GUIDE VASC ACCESS RIGHT  03/17/2024     Allergies: Pholcodine and Sulfa antibiotics  Medications: Prior to Admission medications   Medication Sig Start Date End Date Taking? Authorizing Provider  apixaban  (ELIQUIS ) 5 MG TABS tablet Take 2 tablets (10 mg total) by mouth 2 (two) times daily for 7 days, THEN 1 tablet (5 mg total) 2 (two) times daily for 23 days. 03/20/24 04/19/24  Chatterjee, Srobona Tublu, MD  loratadine (CLARITIN) 10 MG tablet Take 10 mg by mouth daily as needed for allergies or rhinitis.    [provider]  losartan  (COZAAR ) 50 MG tablet Take 50 mg by mouth daily.    [provider]  omeprazole  (PRILOSEC) 40 MG capsule Take 1 capsule (40 mg total) by mouth in the morning and at bedtime. Patient not taking: Reported on 03/17/2024 10/20/22   Lindle Rhea, MD  propranolol  (INDERAL ) 40 MG tablet Take 40 mg by mouth daily.    [provider]  TUMS 500 MG chewable tablet Chew 1-2 tablets by mouth 2 (two) times daily as needed for indigestion or heartburn.    [provider]  TYLENOL  8 HOUR ARTHRITIS PAIN 650 MG CR tablet Take 650 mg by mouth every 8 (eight) hours as needed for pain.    [provider]     Family History  Problem Relation Age of Onset   Heart failure Mother    Hypertension Mother    Pneumonia Father    Diabetes Father    Hypertension Brother  Diabetes Brother    Colon cancer Neg Hx    Esophageal cancer Neg Hx    Rectal cancer Neg Hx    Stomach cancer Neg Hx     Social History   Socioeconomic History   Marital status: Widowed    Spouse name: Not on file   Number of children: 2   Years of education: Not on file   Highest education level: Not on file  Occupational History   Not on file  Tobacco Use   Smoking status: Never   Smokeless tobacco: Never  Substance and Sexual Activity   Alcohol use: Not Currently   Drug use: Not Currently   Sexual activity: Not on file  Other Topics Concern   Not on file  Social History Narrative    Lives alone    Right handed   Caffeine: 1-2 cups/day   Social Drivers of Health   Financial Resource Strain: Not on file  Food Insecurity: No Food Insecurity (03/17/2024)   Hunger Vital Sign    Worried About Running Out of Food in the Last Year: Never true    Ran Out of Food in the Last Year: Never true  Transportation Needs: No Transportation Needs (03/18/2024)   PRAPARE - Administrator, Civil Service (Medical): No    Lack of Transportation (Non-Medical): No  Physical Activity: Not on file  Stress: Not on file  Social Connections: Unknown (03/18/2024)   Social Connection and Isolation Panel [NHANES]    Frequency of Communication with Friends and Family: More than three times a week    Frequency of Social Gatherings with Friends and Family: More than three times a week    Attends Religious Services: More than 4 times per year    Active Member of [employer] or Organizations: Not on file    Attends Banker Meetings: 1 to 4 times per year    Marital Status: Not on file    Review of Systems  Constitutional: Negative.   Respiratory: Negative.    Cardiovascular: Negative.   Gastrointestinal: Negative.   Genitourinary: Negative.   Musculoskeletal: Negative.   Neurological: Negative.     Review of Systems: A 12 point ROS discussed and pertinent positives are indicated in the HPI above.  All other systems are negative.   Physical Exam No direct physical exam was performed (except for noted visual exam findings with Video Visits).   Vital Signs: There were no vitals taken for this visit.  Imaging: CT ABDOMEN PELVIS W CONTRAST Result Date: 04/13/2024 EXAMINATION: CT ABDOMEN PELVIS W CONTRAST CLINICAL INDICATION: Female, 78 years old. Screening examination TECHNIQUE: Axial CT of the abdomen and pelvis with 100 cc Omnipaque  300 intravenous contrast. Multiplanar reformations provided. Unless otherwise specified, incidental thyroid, adrenal, renal lesions do not  require dedicated imaging follow up. Additionally, any mentioned pulmonary nodules do not require dedicated imaging follow-up based on the Fleischner guidelines unless otherwise specified. Coronary calcifications are not identified unless otherwise specified. COMPARISON: FINDINGS: The lung bases demonstrate minimal atelectatic changes. The heart is normal in size. There are coronary calcifications. The liver appears normal. The gallbladder is normal. The spleen is normal. The pancreas demonstrates fatty infiltration. The adrenals are normal. The kidneys are normal. The abdominal aorta is normal in caliber. Scattered atherosclerotic changes are present. The bladder is normal. The uterus contains a small fibroid. There is colonic diverticulosis. The appendix is normal. Large and small bowel loops are otherwise within normal limits. No free fluid or lymphadenopathy. No  concerning osseous lesions. There are degenerative changes of the spine and bony pelvis. IMPRESSION: No acute findings or evidence for malignancy in the abdomen or pelvis. DOSE REDUCTION: This exam was performed according to our departmental dose-optimization program which includes automated exposure control, adjustment of the mA and/or kV according to patient size and/or use of iterative reconstruction technique. Electronically signed by: Leah Engel MD 04/13/2024 02:00 PM EDT RP Workstation: [id number]    Labs:  CBC: Recent Labs    03/17/24 1015 03/18/24 0223 03/19/24 0645 03/20/24 0625  WBC 7.9 9.7 7.2 5.6  HGB 14.7 12.7 11.8* 11.2*  HCT 43.4 37.9 36.2 33.9*  PLT 249 223 189 196    COAGS: Recent Labs    05/31/23 0816  INR 1.1    BMP: Recent Labs    05/31/23 0816 06/01/23 0607 03/17/24 1015 03/18/24 0223  NA 139 140 141 141  K 4.3 3.6 4.0 3.9  CL 108 105 109 110  CO2 21* 22 24 20*  GLUCOSE 126* 125* 132* 151*  BUN 17 11 13 12   CALCIUM  9.0 8.4* 9.5 8.9  CREATININE 1.25* 1.00 1.01* 1.02*  GFRNONAA 45* 59* 58* 57*     LIVER FUNCTION TESTS: Recent Labs    03/17/24 1015  BILITOT 0.8  AST 19  ALT 19  ALKPHOS 69  PROT 7.3  ALBUMIN 3.7    Assessment and Plan:  Ms. Kozuch is doing quite well after recent pulmonary thrombectomy and is quite aware she will now have to be anticoagulated for life unless she has a significant bleeding complication, given that she had a second significant thromboembolic event requiring intervention after coming off anticoagulation. I told her to call Dr. Antionette Kirks office should she experience any recurrent chest or respiratory symptoms or notice any persistent lower extremity edema.   Electronically Signed: Aileen Knight 04/26/2024, 11:25 AM    I spent a total of  15 Minutes in remote  clinical consultation, greater than 50% of which was counseling/coordinating care for post pulmonary thrombectomy.    Visit type: Audio and video Product/process development scientist).   Alternative for in-person consultation at [facility], [address] [place], [place], [place]. This visit type was conducted due to national recommendations for restrictions regarding the COVID-19 Pandemic (e.g. social distancing).  This format is felt to be most appropriate for this patient at this time.  All issues noted in this document were discussed and addressed.

## 2024-06-26 NOTE — Patient Instructions (Incomplete)
 Please continue using your CPAP regularly. While your insurance requires that you use CPAP at least 4 hours each night on 70% of the nights, I recommend, that you not skip any nights and use it throughout the night if you can. Getting used to CPAP and staying with the treatment long term does take time and patience and discipline. Untreated obstructive sleep apnea when it is moderate to severe can have an adverse impact on cardiovascular health and raise her risk for heart disease, arrhythmias, hypertension, congestive heart failure, stroke and diabetes. Untreated obstructive sleep apnea causes sleep disruption, nonrestorative sleep, and sleep deprivation. This can have an impact on your day to day functioning and cause daytime sleepiness and impairment of cognitive function, memory loss, mood disturbance, and problems focussing. Using CPAP regularly can improve these symptoms.  We will order an overnight oxygen test while using your CPAP to make sure your oxygen levels are normal when you are sleeping. Listen for a call from [employer] to schedule.   Monitor heart rate at home. Let Dr Tisovec know if you heart rate is consisently below 55. Continue Eliquis  per PCP instruction.   Follow up in 1 year

## 2024-06-26 NOTE — Progress Notes (Unsigned)
 SABRA

## 2024-06-26 NOTE — Progress Notes (Unsigned)
 PATIENT: Leah Knight DOB: 04/22/1946  REASON FOR VISIT: follow up HISTORY FROM: patient  No chief complaint on file.    HISTORY OF PRESENT ILLNESS:  06/26/24 ALL:  Leah Knight returns for follow up for OSA on CPAP. She was last seen by me 06/2023 and doing well. She called wishing to replace machine 01/2024. HST showed AHI 62.1/h and O2 nadir of 78%  NEED ONO  06/28/2023 ALL:  Leah Knight returns for follow up for OSA on CPAP. She is doing well. She is using therapy nightly for about 6-7 hours, on average. She denies concerns with machine or supplies. She is not interested in getting a new machine at this time. She was diagnosed with a pulmonary embolus and started on Eliquis . She is recovering well.     06/24/2022 ALL: Leah Knight is a 78 y.o. female here today for follow up for OSA on CPAP. She reports doing well on therapy. No concerns with machine or supplies. She is using her CPAP most nights. She did go out of town and did not take it with her. She admits that she falls asleep before starting therapy sometimes. She does note significant improvement in sleep quality and wakes feeling more refreshed when using CPAP. She has gained some weight and feels this may be contributing to fatigue.     HISTORY: (copied from Dr Dohmeier's previous note)  Interval history from 06-18-2021 : I have the pleasure of meeting Ms. [facility] today the patient has been a CPAP user.avs Had not been able to use his CPAP every day of the last 30 days.  Her overall compliance is 23 out of 30 days with 77% the average daily use at time is all days also those of normal use is 3 hours and 54 minutes.  She is using an AutoSet between a minimum pressure of 5 and a maximum pressure of 15 cmH2O with 3 cm EPR she is requiring at the 95th percentile 13.5 cm water.  Her residual AHI is 1.6 which speaks for an excellent resolution and she has only mild air leakage from the mask.  So overall this seems to work  very well for her I think we could increase the maximum pressure to 17 just to make sure that she has able to respond to all obstructive events appropriately.  The patient also has a history of gastroesophageal reflux reflux and acid reflux can mimic apnea she is currently treated on pantoprazole  40 mg daily, she is continuing to take propranolol  Inderal  40 mg daily Prilosec was her previous medication she has been on Cozaar  for blood pressure control on vitamin D3 calcium  atorvastatin  and sometimes takes Tylenol  for arthritis.  She has not gone to the weight los program she had spoken about last visit- seems not motivated and depressed.    Today's weight was 169 pounds at a height of 5 foot 2 inches of course this includes her clothes and shoes.   REVIEW OF SYSTEMS: Out of a complete 14 system review of symptoms, the patient complains only of the following symptoms, fatigue, anxiety, and all other reviewed systems are negative.  ESS: 18/24  ALLERGIES: Allergies  Allergen Reactions   Pholcodine Other (See Comments)    This is an opioid cough suppressant- patient is unfamiliar with this entry   Sulfa Antibiotics Itching    HOME MEDICATIONS: Outpatient Medications Prior to Visit  Medication Sig Dispense Refill   apixaban  (ELIQUIS ) 5 MG TABS tablet Take 2 tablets (10  mg total) by mouth 2 (two) times daily for 7 days, THEN 1 tablet (5 mg total) 2 (two) times daily for 23 days. 60 tablet 0   loratadine (CLARITIN) 10 MG tablet Take 10 mg by mouth daily as needed for allergies or rhinitis.     losartan  (COZAAR ) 50 MG tablet Take 50 mg by mouth daily.     omeprazole  (PRILOSEC) 40 MG capsule Take 1 capsule (40 mg total) by mouth in the morning and at bedtime. (Patient not taking: Reported on 03/17/2024) 180 capsule 3   propranolol  (INDERAL ) 40 MG tablet Take 40 mg by mouth daily.     TUMS 500 MG chewable tablet Chew 1-2 tablets by mouth 2 (two) times daily as needed for indigestion or heartburn.      TYLENOL  8 HOUR ARTHRITIS PAIN 650 MG CR tablet Take 650 mg by mouth every 8 (eight) hours as needed for pain.     No facility-administered medications prior to visit.    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   Hx of retinal hemorrhage    Hyperlipemia    Hypertension    Sleep apnea    Stroke [facility])     PAST SURGICAL HISTORY: Past Surgical History:  Procedure Laterality Date   CATARACT EXTRACTION, BILATERAL  summer 2021   childbirth     x 2   IR ANGIOGRAM PULMONARY BILATERAL SELECTIVE  05/31/2023   IR ANGIOGRAM PULMONARY BILATERAL SELECTIVE  03/17/2024   IR ANGIOGRAM SELECTIVE EACH ADDITIONAL VESSEL  05/31/2023   IR ANGIOGRAM SELECTIVE EACH ADDITIONAL VESSEL  05/31/2023   IR RADIOLOGIST EVAL & MGMT  04/26/2024   IR THROMBECT PRIM MECH INIT (INCLU) MOD SED  05/31/2023   IR THROMBECT PRIM MECH INIT (INCLU) MOD SED  05/31/2023   IR THROMBECT PRIM MECH INIT (INCLU) MOD SED  03/17/2024   IR THROMBECT SEC MECH MOD SED  03/17/2024   IR US  GUIDE VASC ACCESS RIGHT  05/31/2023   IR US  GUIDE VASC ACCESS RIGHT  03/17/2024    FAMILY HISTORY: Family History  Problem Relation Age of Onset   Heart failure Mother    Hypertension Mother    Pneumonia Father    Diabetes Father    Hypertension Brother    Diabetes Brother    Colon cancer Neg Hx    Esophageal cancer Neg Hx    Rectal cancer Neg Hx    Stomach cancer Neg Hx     SOCIAL HISTORY: Social History   Socioeconomic History   Marital status: Widowed    Spouse name: Not on file   Number of children: 2   Years of education: Not on file   Highest education level: Not on file  Occupational History   Not on file  Tobacco Use   Smoking status: Never   Smokeless tobacco: Never  Substance and Sexual Activity   Alcohol use: Not Currently   Drug use: Not Currently   Sexual activity: Not on file  Other  Topics Concern   Not on file  Social History Narrative   Lives alone    Right handed   Caffeine: 1-2 cups/day   Social Drivers of Health   Financial Resource Strain: Not on file  Food Insecurity: No Food Insecurity (03/17/2024)   Hunger Vital Sign    Worried About Running Out of Food in the Last  Year: Never true    Ran Out of Food in the Last Year: Never true  Transportation Needs: No Transportation Needs (03/18/2024)   PRAPARE - Administrator, Civil Service (Medical): No    Lack of Transportation (Non-Medical): No  Physical Activity: Not on file  Stress: Not on file  Social Connections: Unknown (03/18/2024)   Social Connection and Isolation Panel    Frequency of Communication with Friends and Family: More than three times a week    Frequency of Social Gatherings with Friends and Family: More than three times a week    Attends Religious Services: More than 4 times per year    Active Member of [employer] or Organizations: Not on file    Attends Banker Meetings: 1 to 4 times per year    Marital Status: Not on file  Intimate Partner Violence: Not At Risk (03/18/2024)   Humiliation, Afraid, Rape, and Kick questionnaire    Fear of Current or Ex-Partner: No    Emotionally Abused: No    Physically Abused: No    Sexually Abused: No     PHYSICAL EXAM  There were no vitals filed for this visit.   There is no height or weight on file to calculate BMI.  Generalized: Well developed, in no acute distress  Cardiology: normal rate and rhythm, no murmur noted Respiratory: clear to auscultation bilaterally  Neurological examination  Mentation: Alert oriented to time, place, history taking. Follows all commands speech and language fluent Cranial nerve II-XII: Pupils were equal round reactive to light. Extraocular movements were full, visual field were full  Motor: The motor testing reveals 5 over 5 strength of all 4 extremities. Good symmetric motor tone is noted  throughout.  Gait and station: Gait is normal.    DIAGNOSTIC DATA (LABS, IMAGING, TESTING) - I reviewed patient records, labs, notes, testing and imaging myself where available.      No data to display           Lab Results  Component Value Date   WBC 5.6 03/20/2024   HGB 11.2 (L) 03/20/2024   HCT 33.9 (L) 03/20/2024   MCV 93.9 03/20/2024   PLT 196 03/20/2024      Component Value Date/Time   NA 141 03/18/2024 0223   K 3.9 03/18/2024 0223   CL 110 03/18/2024 0223   CO2 20 (L) 03/18/2024 0223   GLUCOSE 151 (H) 03/18/2024 0223   BUN 12 03/18/2024 0223   CREATININE 1.02 (H) 03/18/2024 0223   CREATININE 0.92 12/15/2014 1050   CALCIUM  8.9 03/18/2024 0223   PROT 7.3 03/17/2024 1015   ALBUMIN 3.7 03/17/2024 1015   AST 19 03/17/2024 1015   ALT 19 03/17/2024 1015   ALKPHOS 69 03/17/2024 1015   BILITOT 0.8 03/17/2024 1015   GFRNONAA 57 (L) 03/18/2024 0223   No results found for: CHOL, HDL, LDLCALC, LDLDIRECT, TRIG, CHOLHDL No results found for: YHAJ8R No results found for: VITAMINB12 Lab Results  Component Value Date   TSH 2.209 12/15/2014     ASSESSMENT AND PLAN 78 y.o. year old female  has a past medical history of Allergy, Arthritis, GERD (gastroesophageal reflux disease), H/O: stroke (2000), retinal hemorrhage, Hyperlipemia, Hypertension, Sleep apnea, and Stroke (HCC). here with   No diagnosis found.     Norvell Ureste Nappi is doing well on CPAP therapy. Compliance report reveals acceptable compliance. She was encouraged to continue using CPAP nightly and for greater than 4 hours each night. EDS 18/24. Will  continue to monitor. She was encouraged to work on improving compliance and let me know if daytime sleepiness does not improve. I would prefer to avoid stimulants if possible but may consider as needed if EDS effects daytime functioning. We will update supply orders as indicated. Risks of untreated sleep apnea review and education materials provided.  Healthy lifestyle habits encouraged. She will follow up in 1 year, sooner if needed. She verbalizes understanding and agreement with this plan.    No orders of the defined types were placed in this encounter.    No orders of the defined types were placed in this encounter.     Greig Forbes, FNP-C 06/26/2024, 4:26 PM [facility] [address] [place], [place] [address] [phone]

## 2024-06-27 ENCOUNTER — Ambulatory Visit: Payer: Medicare Other | Admitting: Family Medicine

## 2024-06-27 ENCOUNTER — Encounter: Payer: Self-pay | Admitting: Family Medicine

## 2024-06-27 VITALS — BP 130/70 | HR 54 | Ht 62.0 in | Wt 161.2 lb

## 2024-06-27 DIAGNOSIS — I2602 Saddle embolus of pulmonary artery with acute cor pulmonale: Secondary | ICD-10-CM

## 2024-06-27 DIAGNOSIS — G4730 Sleep apnea, unspecified: Secondary | ICD-10-CM | POA: Diagnosis not present

## 2024-06-27 DIAGNOSIS — G4736 Sleep related hypoventilation in conditions classified elsewhere: Secondary | ICD-10-CM | POA: Diagnosis not present

## 2024-06-27 DIAGNOSIS — G4733 Obstructive sleep apnea (adult) (pediatric): Secondary | ICD-10-CM

## 2024-08-19 ENCOUNTER — Encounter (HOSPITAL_BASED_OUTPATIENT_CLINIC_OR_DEPARTMENT_OTHER): Payer: Self-pay | Admitting: Emergency Medicine

## 2024-08-19 ENCOUNTER — Emergency Department (HOSPITAL_BASED_OUTPATIENT_CLINIC_OR_DEPARTMENT_OTHER)

## 2024-08-19 ENCOUNTER — Emergency Department (HOSPITAL_BASED_OUTPATIENT_CLINIC_OR_DEPARTMENT_OTHER)
Admission: EM | Admit: 2024-08-19 | Discharge: 2024-08-19 | Disposition: A | Discharge status: Home / Self Care | Attending: Emergency Medicine | Admitting: Emergency Medicine

## 2024-08-19 ENCOUNTER — Other Ambulatory Visit: Payer: Self-pay

## 2024-08-19 DIAGNOSIS — D72829 Elevated white blood cell count, unspecified: Secondary | ICD-10-CM | POA: Insufficient documentation

## 2024-08-19 DIAGNOSIS — K5732 Diverticulitis of large intestine without perforation or abscess without bleeding: Secondary | ICD-10-CM | POA: Diagnosis not present

## 2024-08-19 DIAGNOSIS — Z7901 Long term (current) use of anticoagulants: Secondary | ICD-10-CM | POA: Insufficient documentation

## 2024-08-19 DIAGNOSIS — I10 Essential (primary) hypertension: Secondary | ICD-10-CM | POA: Insufficient documentation

## 2024-08-19 DIAGNOSIS — K5792 Diverticulitis of intestine, part unspecified, without perforation or abscess without bleeding: Secondary | ICD-10-CM

## 2024-08-19 DIAGNOSIS — R1030 Lower abdominal pain, unspecified: Secondary | ICD-10-CM | POA: Diagnosis present

## 2024-08-19 HISTORY — DX: Other pulmonary embolism without acute cor pulmonale: I26.99

## 2024-08-19 LAB — COMPREHENSIVE METABOLIC PANEL WITH GFR
ALT: 9 U/L (ref 0–44)
AST: 20 U/L (ref 15–41)
Albumin: 4.3 g/dL (ref 3.5–5.0)
Alkaline Phosphatase: 78 U/L (ref 38–126)
Anion gap: 13 (ref 5–15)
BUN: 14 mg/dL (ref 8–23)
CO2: 22 mmol/L (ref 22–32)
Calcium: 9.5 mg/dL (ref 8.9–10.3)
Chloride: 103 mmol/L (ref 98–111)
Creatinine, Ser: 0.88 mg/dL (ref 0.44–1.00)
GFR, Estimated: >60 mL/min (ref >60)
Glucose, Bld: 121 mg/dL — ABNORMAL HIGH (ref 70–99)
Potassium: 4.1 mmol/L (ref 3.5–5.1)
Sodium: 138 mmol/L (ref 135–145)
Total Bilirubin: 0.6 mg/dL (ref 0.0–1.2)
Total Protein: 7.2 g/dL (ref 6.5–8.1)

## 2024-08-19 LAB — CBC WITH DIFFERENTIAL/PLATELET
Abs Immature Granulocytes: 0.05 K/uL (ref 0.00–0.07)
Basophils Absolute: 0.1 K/uL (ref 0.0–0.1)
Basophils Relative: 0 %
Eosinophils Absolute: 0.4 K/uL (ref 0.0–0.5)
Eosinophils Relative: 3 %
HCT: 42.6 % (ref 36.0–46.0)
Hemoglobin: 14.4 g/dL (ref 12.0–15.0)
Immature Granulocytes: 0 %
Lymphocytes Relative: 11 %
Lymphs Abs: 1.4 K/uL (ref 0.7–4.0)
MCH: 30.8 pg (ref 26.0–34.0)
MCHC: 33.8 g/dL (ref 30.0–36.0)
MCV: 91 fL (ref 80.0–100.0)
Monocytes Absolute: 1.2 K/uL — ABNORMAL HIGH (ref 0.1–1.0)
Monocytes Relative: 9 %
Neutro Abs: 10.1 K/uL — ABNORMAL HIGH (ref 1.7–7.7)
Neutrophils Relative %: 77 %
Platelets: 296 K/uL (ref 150–400)
RBC: 4.68 MIL/uL (ref 3.87–5.11)
RDW: 13.6 % (ref 11.5–15.5)
WBC: 13.3 K/uL — ABNORMAL HIGH (ref 4.0–10.5)
nRBC: 0 % (ref 0.0–0.2)

## 2024-08-19 LAB — URINALYSIS, ROUTINE W REFLEX MICROSCOPIC
Bilirubin Urine: NEGATIVE
Glucose, UA: NEGATIVE mg/dL
Ketones, ur: NEGATIVE mg/dL
Leukocytes,Ua: NEGATIVE
Nitrite: NEGATIVE
Protein, ur: NEGATIVE mg/dL
Specific Gravity, Urine: 1.02 (ref 1.005–1.030)
pH: 5.5 (ref 5.0–8.0)

## 2024-08-19 LAB — LIPASE, BLOOD: Lipase: 16 U/L (ref 11–51)

## 2024-08-19 LAB — URINALYSIS, MICROSCOPIC (REFLEX): WBC, UA: NONE SEEN WBC/hpf (ref 0–5)

## 2024-08-19 MED ORDER — METRONIDAZOLE 500 MG PO TABS: 500.0000 mg | ORAL_TABLET | Freq: Two times a day (BID) | ORAL | 0 refills | Status: AC

## 2024-08-19 MED ORDER — OXYCODONE-ACETAMINOPHEN 5-325 MG PO TABS
1.0000 | ORAL_TABLET | Freq: Once | ORAL | Status: AC
  Administered 2024-08-19: 1 via ORAL
  Filled 2024-08-19: qty 1

## 2024-08-19 MED ORDER — ONDANSETRON HCL 4 MG/2ML IJ SOLN
4.0000 mg | Freq: Once | INTRAMUSCULAR | Status: DC
  Filled 2024-08-19: qty 2

## 2024-08-19 MED ORDER — MORPHINE SULFATE (PF) 2 MG/ML IV SOLN
2.0000 mg | Freq: Once | INTRAVENOUS | Status: DC
  Filled 2024-08-19: qty 1

## 2024-08-19 MED ORDER — METRONIDAZOLE 500 MG PO TABS: 500.0000 mg | ORAL_TABLET | Freq: Two times a day (BID) | ORAL | 0 refills | Status: DC

## 2024-08-19 MED ORDER — CIPROFLOXACIN HCL 500 MG PO TABS: 500.0000 mg | ORAL_TABLET | Freq: Two times a day (BID) | ORAL | 0 refills | Status: DC

## 2024-08-19 MED ORDER — ONDANSETRON HCL 4 MG PO TABS: 4.0000 mg | ORAL_TABLET | Freq: Four times a day (QID) | ORAL | 0 refills | Status: DC

## 2024-08-19 MED ORDER — ONDANSETRON HCL 4 MG PO TABS: 4.0000 mg | ORAL_TABLET | Freq: Four times a day (QID) | ORAL | 0 refills | Status: AC

## 2024-08-19 MED ORDER — CIPROFLOXACIN HCL 500 MG PO TABS: 500.0000 mg | ORAL_TABLET | Freq: Two times a day (BID) | ORAL | 0 refills | Status: AC

## 2024-08-19 MED ORDER — OXYCODONE-ACETAMINOPHEN 5-325 MG PO TABS: 1.0000 | ORAL_TABLET | Freq: Four times a day (QID) | ORAL | 0 refills | Status: DC | PRN

## 2024-08-19 MED ORDER — OXYCODONE-ACETAMINOPHEN 5-325 MG PO TABS: 1.0000 | ORAL_TABLET | Freq: Four times a day (QID) | ORAL | 0 refills | Status: AC | PRN

## 2024-08-19 MED ORDER — ONDANSETRON 4 MG PO TBDP
4.0000 mg | ORAL_TABLET | Freq: Once | ORAL | Status: AC
  Administered 2024-08-19: 4 mg via ORAL
  Filled 2024-08-19: qty 1

## 2024-08-19 MED ORDER — SODIUM CHLORIDE 0.9 % IV BOLUS
500.0000 mL | Freq: Once | INTRAVENOUS | Status: AC
  Administered 2024-08-19: 500 mL via INTRAVENOUS

## 2024-08-19 MED ORDER — IOHEXOL 300 MG/ML  SOLN
100.0000 mL | Freq: Once | INTRAMUSCULAR | Status: AC | PRN
  Administered 2024-08-19: 100 mL via INTRAVENOUS

## 2024-08-19 NOTE — ED Triage Notes (Signed)
 Pt c/o generalized lower abd pain since last night; +nausea

## 2024-08-19 NOTE — Discharge Instructions (Addendum)
 You were given to set a medication center to help treat your infection.  Please take this medication as directed.  Ciprofloxacin  should take 1 tablet twice a day for the next 1 days.  Flagyl  should be taken 1 tablet twice a day for the next 7 days.  The medication Zofran  is for your nausea, please take this as needed.  You were also given pain medication, please be aware this is a narcotic medication that can cause constipation.

## 2024-08-19 NOTE — ED Provider Notes (Signed)
 [place] [facility] Provider Note   CSN: [id number] Arrival date & time: 08/19/24  1612     Patient presents with: Abdominal Pain   Leah Knight is a 78 y.o. female.   78 y.o female with a PMH of hypertension, pulmonary embolism presents to the ED with a chief complaint of lower abdominal pain beginning last night.  Describing as a cramping sharp sensation to the entire lower abdomen, exacerbated with movement, was somewhat alleviated after she took some Pepto-Bismol along with Tylenol .  She reports last oral intake was yesterday.  No prior episodes similar to these.  According to her friend at the bedside, when she arrived to her home she was curled up in bed.  She has had about 7 episodes of soft bowel movements today, she does not reported this is diarrhea, has not seen any blood present in them.  No fever, no vomiting, no other complaints reported.  The history is provided by the patient.  Abdominal Pain Associated symptoms: diarrhea and nausea   Associated symptoms: no chest pain, no chills, no fever, no shortness of breath, no sore throat and no vomiting        Prior to Admission medications   Medication Sig Start Date End Date Taking? Authorizing Provider  ciprofloxacin  (CIPRO ) 500 MG tablet Take 1 tablet (500 mg total) by mouth every 12 (twelve) hours for 7 days. 08/19/24 08/26/24 Yes Trayven Lumadue, PA-C  metroNIDAZOLE  (FLAGYL ) 500 MG tablet Take 1 tablet (500 mg total) by mouth 2 (two) times daily for 7 days. 08/19/24 08/26/24 Yes Taline Nass, PA-C  ondansetron  (ZOFRAN ) 4 MG tablet Take 1 tablet (4 mg total) by mouth every 6 (six) hours for 5 days. 08/19/24 08/24/24 Yes Kawan Valladolid, PA-C  oxyCODONE -acetaminophen  (PERCOCET/ROXICET) 5-325 MG tablet Take 1 tablet by mouth every 6 (six) hours as needed for up to 3 days for severe pain (pain score 7-10). 08/19/24 08/22/24 Yes Kerra Guilfoil, PA-C  apixaban  (ELIQUIS ) 5 MG TABS tablet Take 2 tablets  (10 mg total) by mouth 2 (two) times daily for 7 days, THEN 1 tablet (5 mg total) 2 (two) times daily for 23 days. 03/20/24 06/27/24  Chatterjee, Srobona Tublu, MD  loratadine (CLARITIN) 10 MG tablet Take 10 mg by mouth daily as needed for allergies or rhinitis.    [provider]  losartan  (COZAAR ) 50 MG tablet Take 50 mg by mouth daily.    [provider]  omeprazole  (PRILOSEC) 40 MG capsule Take 1 capsule (40 mg total) by mouth in the morning and at bedtime. 10/20/22   Eda Iha, MD  propranolol  (INDERAL ) 40 MG tablet Take 40 mg by mouth daily.    [provider]  TUMS 500 MG chewable tablet Chew 1-2 tablets by mouth 2 (two) times daily as needed for indigestion or heartburn.    [provider]    Allergies: Pholcodine and Sulfa antibiotics    Review of Systems  Constitutional:  Negative for chills and fever.  HENT:  Negative for sore throat.   Respiratory:  Negative for shortness of breath.   Cardiovascular:  Negative for chest pain.  Gastrointestinal:  Positive for abdominal pain, diarrhea and nausea. Negative for blood in stool and vomiting.  Genitourinary:  Negative for difficulty urinating.  Musculoskeletal:  Negative for back pain.  All other systems reviewed and are negative.   Updated Vital Signs BP (!) 143/78 (BP Location: Right Arm)   Pulse 70   Temp 98.1 F (  36.7 C) (Oral)   Resp 18   Ht 5' 2 (1.575 m)   Wt 70.8 kg   SpO2 98%   BMI 28.53 kg/m   Physical Exam Vitals and nursing note reviewed.  Constitutional:      Appearance: She is well-developed.  HENT:     Head: Normocephalic and atraumatic.  Cardiovascular:     Rate and Rhythm: Normal rate.  Pulmonary:     Effort: Pulmonary effort is normal.     Breath sounds: No wheezing or rales.  Abdominal:     General: Abdomen is flat.     Palpations: Abdomen is soft.     Tenderness: There is abdominal tenderness in the suprapubic area and left lower quadrant. There is  guarding.  Skin:    General: Skin is warm and dry.  Neurological:     Mental Status: She is alert and oriented to person, place, and time.     (all labs ordered are listed, but only abnormal results are displayed) Labs Reviewed  COMPREHENSIVE METABOLIC PANEL WITH GFR - Abnormal; Notable for the following components:      Result Value   Glucose, Bld 121 (*)    All other components within normal limits  URINALYSIS, ROUTINE W REFLEX MICROSCOPIC - Abnormal; Notable for the following components:   Hgb urine dipstick SMALL (*)    All other components within normal limits  CBC WITH DIFFERENTIAL/PLATELET - Abnormal; Notable for the following components:   WBC 13.3 (*)    Neutro Abs 10.1 (*)    Monocytes Absolute 1.2 (*)    All other components within normal limits  URINALYSIS, MICROSCOPIC (REFLEX) - Abnormal; Notable for the following components:   Bacteria, UA RARE (*)    All other components within normal limits  LIPASE, BLOOD    EKG: None  Radiology: CT ABDOMEN PELVIS W CONTRAST Result Date: 08/19/2024 CLINICAL DATA:  Acute abdominal pain. EXAM: CT ABDOMEN AND PELVIS WITH CONTRAST TECHNIQUE: Multidetector CT imaging of the abdomen and pelvis was performed using the standard protocol following bolus administration of intravenous contrast. RADIATION DOSE REDUCTION: This exam was performed according to the departmental dose-optimization program which includes automated exposure control, adjustment of the mA and/or kV according to patient size and/or use of iterative reconstruction technique. CONTRAST:  OMNIPAQUE  IOHEXOL  300 MG/ML  SOLN COMPARISON:  CT abdomen and pelvis 04/13/2024 FINDINGS: Lower chest: No acute abnormality. Hepatobiliary: No focal liver abnormality is seen. No gallstones, gallbladder wall thickening, or biliary dilatation. Pancreas: Unremarkable. No pancreatic ductal dilatation or surrounding inflammatory changes. Spleen: Normal in size without focal abnormality.  Adrenals/Urinary Tract: Adrenal glands are unremarkable. Kidneys are normal, without renal calculi, focal lesion, or hydronephrosis. Bladder is unremarkable. Stomach/Bowel: There sigmoid and descending colon diverticulosis. There is wall thickening and inflammation of the proximal sigmoid colon compatible with acute diverticulitis. There is no perforation or abscess identified. There is no bowel obstruction or pneumatosis. The appendix is within normal limits. There is a small hiatal hernia. Stomach is decompressed. Vascular/Lymphatic: Aortic atherosclerosis. No enlarged abdominal or pelvic lymph nodes. Reproductive: Posterior right uterine fibroid is present measuring 3.1 cm. Adnexa are within normal limits. Other: No abdominal wall hernia or abnormality. No abdominopelvic ascites. Musculoskeletal: The bones are diffusely osteopenic. Degenerative changes affect the spine. IMPRESSION: 1. Acute uncomplicated sigmoid colon diverticulitis. 2. Uterine fibroid. 3. Aortic atherosclerosis. Aortic Atherosclerosis (ICD10-I70.0). Electronically Signed   By: Greig Pique M.D.   On: 08/19/2024 18:14     Procedures   Medications Ordered  in the ED  ondansetron  (ZOFRAN ) injection 4 mg (4 mg Intravenous Patient Refused/Not Given 08/19/24 1715)  morphine  (PF) 2 MG/ML injection 2 mg (2 mg Intravenous Patient Refused/Not Given 08/19/24 1715)  oxyCODONE -acetaminophen  (PERCOCET/ROXICET) 5-325 MG per tablet 1 tablet (has no administration in time range)  ondansetron  (ZOFRAN -ODT) disintegrating tablet 4 mg (has no administration in time range)  sodium chloride  0.9 % bolus 500 mL (500 mLs Intravenous New Bag/Given 08/19/24 1712)  iohexol  (OMNIPAQUE ) 300 MG/ML solution 100 mL (100 mLs Intravenous Contrast Given 08/19/24 1733)    Clinical Course as of 08/19/24 1835  Sun Aug 19, 2024  1703 WBC(!): 13.3 [JS]    Clinical Course User Index [JS] Jaivyn Gulla, PA-C                                 Medical Decision  Making Amount and/or Complexity of Data Reviewed Labs:  Decision-making details documented in ED Course. Radiology: ordered.  Risk Prescription drug management.   This patient presents to the ED for concern of abdominal pain, this involves a number of treatment options, and is a complaint that carries with it a high risk of complications and morbidity.  The differential diagnosis includes diverticulitis, obstruction versus viral illness.    Co morbidities: Discussed in HPI   Brief History:  See HPI.   EMR reviewed including pt PMHx, past surgical history and past visits to ER.   See HPI for more details   Lab Tests:  I ordered and independently interpreted labs.  The pertinent results include:    I personally reviewed all laboratory work and imaging. Metabolic panel without any acute abnormality specifically kidney function within normal limits and no significant electrolyte abnormalities. CBC without leukocytosis or significant anemia.   Imaging Studies:  CT Abdomen and pelvis: IMPRESSION:  1. Acute uncomplicated sigmoid colon diverticulitis.  2. Uterine fibroid.  3. Aortic atherosclerosis.    Aortic Atherosclerosis (ICD10-I70.0).    Medicines ordered:  I ordered medication including morphine , zofran , bolus  for symptomatic treatment but she refused. Reevaluation of the patient after these medicines showed that the patient stayed the same I have reviewed the patients home medicines and have made adjustments as needed  Reevaluation:  After the interventions noted above I re-evaluated patient and found that they have :stayed the same   Social Determinants of Health:  The patient's social determinants of health were a factor in the care of this patient  Problem List / ED Course:  Patient presented to the ED with a chief complaint of lower abdominal pain sudden onset last night, described as a cramping sensation.  Has taken some Tylenol , with mild improvement  in her symptoms.  Exacerbated with any type of movement.  No prior symptoms in the past.  To the ED hemodynamically stable, has had multiple soft stools today about 7, no blood that she has noted.  She has been afebrile, did eat last night but nothing today.  No prior surgical history of her abdomen.  Is on a blood thinner such as Eliquis  prior history of stroke. Laboratory results from today's visit are within normal limits, slight elevation in her white blood cell count, consistent with likely an active infection.  CMP with no Electra derangement, creatinine was unremarkable.  LFTs are within normal limits.  She was offered Zofran , morphine  but she refuses medication at this time.  Abdomen remains soft although focally tender along the left lower quadrant,  CT abdomen and pelvis obtained to rule out diverticulitis. CT abdomen pelvis that showed diverticulitis no abscess or perforation noted.  Patient has not had any vomiting episodes here, is tolerating p.o.  We discussed treatment of this with Cipro  Flagyl , Zofran , pain medication.  She is aware that this can cause constipation therefore we discussed likely adding some stool softener along with MiraLAX  to her diet.  Vitals are within normal limits.  She is agreeable with close follow-up with PCP as needed.  Patient is hemodynamically stable for discharge.   Dispostion:  After consideration of the diagnostic results and the patients response to treatment, I feel that the patent would benefit from treatment of diverticulitis with antibiotic therapy.    Portions of this note were generated with Scientist, clinical (histocompatibility and immunogenetics). Dictation errors may occur despite best attempts at proofreading.   Final diagnoses:  Diverticulitis    ED Discharge Orders          Ordered    ciprofloxacin  (CIPRO ) 500 MG tablet  Every 12 hours        08/19/24 1832    metroNIDAZOLE  (FLAGYL ) 500 MG tablet  2 times daily        08/19/24 1832    ondansetron  (ZOFRAN ) 4 MG tablet   Every 6 hours        08/19/24 1832    oxyCODONE -acetaminophen  (PERCOCET/ROXICET) 5-325 MG tablet  Every 6 hours PRN        08/19/24 1832               Lynzee Lindquist, PA-C 08/19/24 1835    Mannie Pac T, DO 08/19/24 2317

## 2025-07-02 ENCOUNTER — Ambulatory Visit: Admitting: Family Medicine
